# Patient Record
Sex: Female | Born: 1940 | Race: White | Hispanic: No | Marital: Married | Smoking: Current every day smoker
Health system: Southern US, Community
[De-identification: ages and names within clinical notes are randomized; demographics above are authoritative.]

## PROBLEM LIST (undated history)

## (undated) DIAGNOSIS — F0390 Unspecified dementia without behavioral disturbance: Secondary | ICD-10-CM

## (undated) HISTORY — DX: Unspecified dementia, unspecified severity, without behavioral disturbance, psychotic disturbance, mood disturbance, and anxiety: F03.90

---

## 2000-11-24 ENCOUNTER — Emergency Department (HOSPITAL_COMMUNITY): Admission: EM | Admit: 2000-11-24 | Discharge: 2000-11-25 | Payer: Self-pay | Admitting: Emergency Medicine

## 2010-11-02 ENCOUNTER — Encounter
Admission: RE | Admit: 2010-11-02 | Discharge: 2010-11-02 | Payer: Self-pay | Source: Home / Self Care | Attending: Family Medicine | Admitting: Family Medicine

## 2014-01-13 ENCOUNTER — Other Ambulatory Visit: Payer: Self-pay | Admitting: Family Medicine

## 2014-01-13 DIAGNOSIS — Z1231 Encounter for screening mammogram for malignant neoplasm of breast: Secondary | ICD-10-CM

## 2014-01-26 ENCOUNTER — Ambulatory Visit
Admission: RE | Admit: 2014-01-26 | Discharge: 2014-01-26 | Disposition: A | Discharge status: Home / Self Care | Payer: Medicare Other | Source: Ambulatory Visit | Attending: Family Medicine | Admitting: Family Medicine

## 2014-01-26 DIAGNOSIS — Z1231 Encounter for screening mammogram for malignant neoplasm of breast: Secondary | ICD-10-CM

## 2021-03-22 ENCOUNTER — Other Ambulatory Visit: Payer: Self-pay

## 2021-03-22 ENCOUNTER — Emergency Department (HOSPITAL_COMMUNITY): Payer: Medicare Other

## 2021-03-22 ENCOUNTER — Inpatient Hospital Stay (HOSPITAL_COMMUNITY)
Admission: EM | Admit: 2021-03-22 | Discharge: 2021-03-25 | DRG: 445 | Disposition: A | Discharge status: Home Health | Payer: Medicare Other | Attending: Internal Medicine | Admitting: Internal Medicine

## 2021-03-22 DIAGNOSIS — R7989 Other specified abnormal findings of blood chemistry: Secondary | ICD-10-CM | POA: Diagnosis not present

## 2021-03-22 DIAGNOSIS — E871 Hypo-osmolality and hyponatremia: Secondary | ICD-10-CM | POA: Diagnosis present

## 2021-03-22 DIAGNOSIS — F17210 Nicotine dependence, cigarettes, uncomplicated: Secondary | ICD-10-CM | POA: Diagnosis present

## 2021-03-22 DIAGNOSIS — Y92009 Unspecified place in unspecified non-institutional (private) residence as the place of occurrence of the external cause: Secondary | ICD-10-CM

## 2021-03-22 DIAGNOSIS — S22051A Stable burst fracture of T5-T6 vertebra, initial encounter for closed fracture: Secondary | ICD-10-CM | POA: Diagnosis present

## 2021-03-22 DIAGNOSIS — E860 Dehydration: Secondary | ICD-10-CM | POA: Diagnosis present

## 2021-03-22 DIAGNOSIS — W010XXA Fall on same level from slipping, tripping and stumbling without subsequent striking against object, initial encounter: Secondary | ICD-10-CM | POA: Diagnosis present

## 2021-03-22 DIAGNOSIS — W19XXXA Unspecified fall, initial encounter: Secondary | ICD-10-CM

## 2021-03-22 DIAGNOSIS — R112 Nausea with vomiting, unspecified: Secondary | ICD-10-CM | POA: Diagnosis present

## 2021-03-22 DIAGNOSIS — K831 Obstruction of bile duct: Secondary | ICD-10-CM | POA: Diagnosis present

## 2021-03-22 DIAGNOSIS — R935 Abnormal findings on diagnostic imaging of other abdominal regions, including retroperitoneum: Secondary | ICD-10-CM | POA: Diagnosis not present

## 2021-03-22 DIAGNOSIS — G9341 Metabolic encephalopathy: Secondary | ICD-10-CM | POA: Diagnosis not present

## 2021-03-22 DIAGNOSIS — Z419 Encounter for procedure for purposes other than remedying health state, unspecified: Secondary | ICD-10-CM

## 2021-03-22 DIAGNOSIS — K810 Acute cholecystitis: Secondary | ICD-10-CM | POA: Diagnosis not present

## 2021-03-22 DIAGNOSIS — E861 Hypovolemia: Secondary | ICD-10-CM | POA: Diagnosis present

## 2021-03-22 DIAGNOSIS — F0390 Unspecified dementia without behavioral disturbance: Secondary | ICD-10-CM | POA: Diagnosis present

## 2021-03-22 DIAGNOSIS — K828 Other specified diseases of gallbladder: Secondary | ICD-10-CM | POA: Diagnosis present

## 2021-03-22 DIAGNOSIS — T1490XA Injury, unspecified, initial encounter: Secondary | ICD-10-CM

## 2021-03-22 DIAGNOSIS — K269 Duodenal ulcer, unspecified as acute or chronic, without hemorrhage or perforation: Secondary | ICD-10-CM | POA: Diagnosis present

## 2021-03-22 DIAGNOSIS — R1010 Upper abdominal pain, unspecified: Secondary | ICD-10-CM

## 2021-03-22 DIAGNOSIS — Z20822 Contact with and (suspected) exposure to covid-19: Secondary | ICD-10-CM | POA: Diagnosis present

## 2021-03-22 DIAGNOSIS — E042 Nontoxic multinodular goiter: Secondary | ICD-10-CM | POA: Diagnosis present

## 2021-03-22 DIAGNOSIS — D6489 Other specified anemias: Secondary | ICD-10-CM | POA: Diagnosis present

## 2021-03-22 DIAGNOSIS — E876 Hypokalemia: Secondary | ICD-10-CM | POA: Diagnosis not present

## 2021-03-22 DIAGNOSIS — S2232XA Fracture of one rib, left side, initial encounter for closed fracture: Secondary | ICD-10-CM | POA: Diagnosis present

## 2021-03-22 DIAGNOSIS — E7220 Disorder of urea cycle metabolism, unspecified: Secondary | ICD-10-CM | POA: Diagnosis present

## 2021-03-22 DIAGNOSIS — R17 Unspecified jaundice: Secondary | ICD-10-CM

## 2021-03-22 HISTORY — DX: Obstruction of bile duct: K83.1

## 2021-03-22 LAB — COMPREHENSIVE METABOLIC PANEL
ALT: 458 U/L — ABNORMAL HIGH (ref 0–44)
AST: 578 U/L — ABNORMAL HIGH (ref 15–41)
Albumin: 3.2 g/dL — ABNORMAL LOW (ref 3.5–5.0)
Alkaline Phosphatase: 1068 U/L — ABNORMAL HIGH (ref 38–126)
Anion gap: 11 (ref 5–15)
BUN: 15 mg/dL (ref 8–23)
CO2: 25 mmol/L (ref 22–32)
Calcium: 9.1 mg/dL (ref 8.9–10.3)
Chloride: 96 mmol/L — ABNORMAL LOW (ref 98–111)
Creatinine, Ser: 0.88 mg/dL (ref 0.44–1.00)
GFR, Estimated: >60 mL/min (ref >60)
Glucose, Bld: 116 mg/dL — ABNORMAL HIGH (ref 70–99)
Potassium: 3.5 mmol/L (ref 3.5–5.1)
Sodium: 132 mmol/L — ABNORMAL LOW (ref 135–145)
Total Bilirubin: 16.4 mg/dL — ABNORMAL HIGH (ref 0.3–1.2)
Total Protein: 6.7 g/dL (ref 6.5–8.1)

## 2021-03-22 LAB — AMMONIA: Ammonia: 44 umol/L — ABNORMAL HIGH (ref 9–35)

## 2021-03-22 LAB — URINALYSIS, ROUTINE W REFLEX MICROSCOPIC
Glucose, UA: NEGATIVE mg/dL
Hgb urine dipstick: NEGATIVE
Ketones, ur: 5 mg/dL — AB
Nitrite: NEGATIVE
Protein, ur: NEGATIVE mg/dL
Specific Gravity, Urine: 1.013 (ref 1.005–1.030)
pH: 5 (ref 5.0–8.0)

## 2021-03-22 LAB — CBC WITH DIFFERENTIAL/PLATELET
Abs Immature Granulocytes: 0.11 10*3/uL — ABNORMAL HIGH (ref 0.00–0.07)
Basophils Absolute: 0.1 10*3/uL (ref 0.0–0.1)
Basophils Relative: 1 %
Eosinophils Absolute: 0.2 10*3/uL (ref 0.0–0.5)
Eosinophils Relative: 3 %
HCT: 34.4 % — ABNORMAL LOW (ref 36.0–46.0)
Hemoglobin: 12.5 g/dL (ref 12.0–15.0)
Immature Granulocytes: 1 %
Lymphocytes Relative: 20 %
Lymphs Abs: 1.5 10*3/uL (ref 0.7–4.0)
MCH: 32.7 pg (ref 26.0–34.0)
MCHC: 36.3 g/dL — ABNORMAL HIGH (ref 30.0–36.0)
MCV: 90.1 fL (ref 80.0–100.0)
Monocytes Absolute: 0.8 10*3/uL (ref 0.1–1.0)
Monocytes Relative: 10 %
Neutro Abs: 5 10*3/uL (ref 1.7–7.7)
Neutrophils Relative %: 65 %
Platelets: 567 10*3/uL — ABNORMAL HIGH (ref 150–400)
RBC: 3.82 MIL/uL — ABNORMAL LOW (ref 3.87–5.11)
RDW: 15.3 % (ref 11.5–15.5)
WBC: 7.7 10*3/uL (ref 4.0–10.5)
nRBC: 0 % (ref 0.0–0.2)

## 2021-03-22 LAB — TROPONIN I (HIGH SENSITIVITY): Troponin I (High Sensitivity): 15 ng/L (ref <18)

## 2021-03-22 LAB — ACETAMINOPHEN LEVEL: Acetaminophen (Tylenol), Serum: <10 ug/mL — ABNORMAL LOW (ref 10–30)

## 2021-03-22 LAB — LIPASE, BLOOD: Lipase: 27 U/L (ref 11–51)

## 2021-03-22 LAB — SALICYLATE LEVEL: Salicylate Lvl: <7 mg/dL — ABNORMAL LOW (ref 7.0–30.0)

## 2021-03-22 LAB — ETHANOL: Alcohol, Ethyl (B): <10 mg/dL (ref <10)

## 2021-03-22 NOTE — ED Provider Notes (Signed)
Emergency Medicine Provider Triage Evaluation Note  Alicia Spencer , a 80 y.o. female  was evaluated in triage.  Pt complains of ribs injury.  Review of Systems  Positive: L chest wall pain, fall Negative: Lightheadedness, sob, focal weakness or numbness, n/v/d  Physical Exam  There were no vitals taken for this visit. Gen:   Awake, no distress   Resp:  Normal effort  MSK:   Moves extremities without difficulty  Other:  Jaundice in appearance.  TTP to L chest wall, mild upper abd tenderness, no significant midline spine tenderness.  In NAD  Medical Decision Making  Medically screening exam initiated at 3:59 PM.  Appropriate orders placed.  Alicia Spencer was informed that the remainder of the evaluation will be completed by another provider, this initial triage assessment does not replace that evaluation, and the importance of remaining in the ED until their evaluation is complete.  Report falling and striking her L chest against hard object 2 days ago.  Pt is jaundice but is not aware of being jaundice.  Tobacco use but denies alcohol use.  No report hx of cancer.    Fayrene Helper, PA-C 03/22/21 1601    Milagros Loll, MD 03/23/21 [phone]

## 2021-03-22 NOTE — ED Provider Notes (Signed)
[facility] Provider Note   CSN: [id number] Arrival date & time: 03/22/21  1516     History Chief Complaint  Patient presents with  . Jaundice    Alicia Spencer is a 80 y.o. female.  80 y.o female with no PMH presents to the ED with a chief complaint of right side pain and epigastric pain s/p fall x 1 1/2 week ago.  Patient reports she stumbled and tripped causing her to fall forward specifically landing on the right side of her body.  She reports since this fall she has had pain along the thoracic spine, and epigastric region.  She reports she has been taking Tylenol to help with her pain, has only taken 1 pill daily.  She states "you should only take 1 pill ".  She also reports changes in her short term memory.  She states she is able to place an object on the table, then states "I do not know where the object went and I cannot find the words to describe it ". She feels "like I cannot find my words". She is a very poor tangential historian, will contact family in order to obtain collateral information. No chest pain, no shortness of breath, no nausea or vomiting.   The history is provided by the patient and a relative.       No past medical history on file.  There are no problems to display for this patient.     OB History   No obstetric history on file.     No family history on file.     Home Medications Prior to Admission medications   Not on File    Allergies    Sulfites and Iodine  Review of Systems   Review of Systems  Physical Exam Updated Vital Signs BP (!) 158/96 (BP Location: Left Arm)   Pulse 71   Temp 97.7 F (36.5 C) (Oral)   Resp 16   LMP  (LMP Unknown)   SpO2 99%   Physical Exam Vitals and nursing note reviewed.  Constitutional:      Appearance: She is not ill-appearing or toxic-appearing.  HENT:     Head: Normocephalic and atraumatic.     Mouth/Throat:     Mouth: Mucous membranes are moist.   Eyes:     General: Scleral icterus present.  Cardiovascular:     Rate and Rhythm: Normal rate.  Pulmonary:     Effort: Pulmonary effort is normal.  Abdominal:     General: Abdomen is flat. Bowel sounds are decreased.     Palpations: Abdomen is soft. There is no mass.     Tenderness: There is abdominal tenderness. There is no right CVA tenderness or left CVA tenderness. Negative signs include Murphy's sign.     Hernia: No hernia is present.  Musculoskeletal:     Cervical back: Normal range of motion and neck supple.  Skin:    General: Skin is warm and dry.  Neurological:     Mental Status: She is alert and oriented to person, place, and time.     ED Results / Procedures / Treatments   Labs (all labs ordered are listed, but only abnormal results are displayed) Labs Reviewed  CBC WITH DIFFERENTIAL/PLATELET - Abnormal; Notable for the following components:      Result Value   RBC 3.82 (*)    HCT 34.4 (*)    MCHC 36.3 (*)    Platelets 567 (*)  Abs Immature Granulocytes 0.11 (*)    All other components within normal limits  COMPREHENSIVE METABOLIC PANEL - Abnormal; Notable for the following components:   Sodium 132 (*)    Chloride 96 (*)    Glucose, Bld 116 (*)    Albumin 3.2 (*)    AST 578 (*)    ALT 458 (*)    Alkaline Phosphatase 1,068 (*)    Total Bilirubin 16.4 (*)    All other components within normal limits  URINALYSIS, ROUTINE W REFLEX MICROSCOPIC - Abnormal; Notable for the following components:   Color, Urine AMBER (*)    APPearance HAZY (*)    Bilirubin Urine MODERATE (*)    Ketones, ur 5 (*)    Leukocytes,Ua TRACE (*)    Bacteria, UA RARE (*)    All other components within normal limits  ACETAMINOPHEN LEVEL - Abnormal; Notable for the following components:   Acetaminophen (Tylenol), Serum <10 (*)    All other components within normal limits  SALICYLATE LEVEL - Abnormal; Notable for the following components:   Salicylate Lvl <6.3 (*)    All other  components within normal limits  AMMONIA - Abnormal; Notable for the following components:   Ammonia 44 (*)    All other components within normal limits  LIPASE, BLOOD  ETHANOL  HEPATITIS PANEL, ACUTE  TROPONIN I (HIGH SENSITIVITY)  TROPONIN I (HIGH SENSITIVITY)    EKG None  Radiology DG Ribs Unilateral W/Chest Left  Result Date: 03/22/2021 CLINICAL DATA:  80 year old female with fall. EXAM: LEFT RIBS AND CHEST - 3+ VIEW COMPARISON:  None. FINDINGS: No focal consolidation, pleural effusion, or pneumothorax. The cardiac silhouette is within limits. Atherosclerotic calcification of the aorta. Osteopenia with degenerative changes of the spine. Evaluation for rib fracture is very limited due to advanced osteopenia. No obvious or displaced rib fracture identified. IMPRESSION: Negative. Electronically Signed   By: Anner Crete M.D.   On: 03/22/2021 16:30   CT Head Wo Contrast  Result Date: 03/22/2021 CLINICAL DATA:  Fall EXAM: CT HEAD WITHOUT CONTRAST TECHNIQUE: Contiguous axial images were obtained from the base of the skull through the vertex without intravenous contrast. COMPARISON:  None. FINDINGS: Brain: There is atrophy and chronic small vessel disease changes. No acute intracranial abnormality. Specifically, no hemorrhage, hydrocephalus, mass lesion, acute infarction, or significant intracranial injury. Vascular: No hyperdense vessel or unexpected calcification. Skull: No acute calvarial abnormality. Sinuses/Orbits: Visualized paranasal sinuses and mastoids clear. Orbital soft tissues unremarkable. Other: None IMPRESSION: Atrophy, chronic microvascular disease. No acute intracranial abnormality. Electronically Signed   By: Rolm Baptise M.D.   On: 03/22/2021 22:50   CT Cervical Spine Wo Contrast  Result Date: 03/22/2021 CLINICAL DATA:  Fall EXAM: CT CERVICAL SPINE WITHOUT CONTRAST TECHNIQUE: Multidetector CT imaging of the cervical spine was performed without intravenous contrast.  Multiplanar CT image reconstructions were also generated. COMPARISON:  None. FINDINGS: Alignment: No subluxation. Skull base and vertebrae: No acute fracture. No primary bone lesion or focal pathologic process. Soft tissues and spinal canal: No prevertebral fluid or swelling. No visible canal hematoma. Disc levels: Degenerative disc disease most pronounced at C5-6 and C6-7. Bilateral degenerative facet disease, left greater than right. Upper chest: No acute findings Other: Nodule replaces much of the right thyroid lobe measuring up to 2 cm. Smaller nodule in the left thyroid lobe measuring 9 mm. IMPRESSION: Degenerative disc and facet disease.  No acute bony abnormality. Bilateral thyroid nodules measuring up 2 cm in the right thyroid lobe. Recommend non emergent  thyroid US (ref: J Am Coll Radiol. 2015 Feb;12(2): 143-50). Electronically Signed   By: Rolm Baptise M.D.   On: 03/22/2021 22:53   CT T-SPINE NO CHARGE  Result Date: 03/22/2021 CLINICAL DATA:  Fall EXAM: CT Thoracic and Lumbar spine without contrast TECHNIQUE: Multiplanar CT images of the thoracic and lumbar spine were reconstructed from contemporary CT of the Chest, Abdomen, and Pelvis CONTRAST:  No additional contrast COMPARISON:  None FINDINGS: CT THORACIC SPINE FINDINGS Alignment: Normal. Vertebrae: Burst type fracture of T5 with approximately 50% height loss. 2 mm retropulsion. No extension to the posterior elements. Paraspinal and other soft tissues: Calcific aortic atherosclerosis. Disc levels: No spinal canal stenosis. CT LUMBAR SPINE FINDINGS Segmentation: 5 lumbar type vertebrae. Alignment: Grade 1 anterolisthesis at L4-5 Vertebrae: No acute fracture or focal pathologic process. Paraspinal and other soft tissues: Calcific aortic atherosclerosis. Markedly distended gallbladder. Disc levels: Mild spinal canal stenosis at L4-5 secondary to anterolisthesis caused by facet arthrosis. No neural impingement. Moderate right L5-S1 neural foraminal  stenosis. IMPRESSION: 1. Burst type fracture of T5 with approximately 50% height loss and 2 mm retropulsion. No extension to the posterior elements. 2. No acute fracture of the lumbar spine. 3. Mild spinal canal stenosis at L4-5 secondary to anterolisthesis and facet arthrosis. 4. Moderate right L5-S1 neural foraminal stenosis. 5. Markedly distended gallbladder. Aortic Atherosclerosis (ICD10-I70.0). Electronically Signed   By: Ulyses Jarred M.D.   On: 03/22/2021 22:59   CT L-SPINE NO CHARGE  Result Date: 03/22/2021 CLINICAL DATA:  Fall EXAM: CT Thoracic and Lumbar spine without contrast TECHNIQUE: Multiplanar CT images of the thoracic and lumbar spine were reconstructed from contemporary CT of the Chest, Abdomen, and Pelvis CONTRAST:  No additional contrast COMPARISON:  None FINDINGS: CT THORACIC SPINE FINDINGS Alignment: Normal. Vertebrae: Burst type fracture of T5 with approximately 50% height loss. 2 mm retropulsion. No extension to the posterior elements. Paraspinal and other soft tissues: Calcific aortic atherosclerosis. Disc levels: No spinal canal stenosis. CT LUMBAR SPINE FINDINGS Segmentation: 5 lumbar type vertebrae. Alignment: Grade 1 anterolisthesis at L4-5 Vertebrae: No acute fracture or focal pathologic process. Paraspinal and other soft tissues: Calcific aortic atherosclerosis. Markedly distended gallbladder. Disc levels: Mild spinal canal stenosis at L4-5 secondary to anterolisthesis caused by facet arthrosis. No neural impingement. Moderate right L5-S1 neural foraminal stenosis. IMPRESSION: 1. Burst type fracture of T5 with approximately 50% height loss and 2 mm retropulsion. No extension to the posterior elements. 2. No acute fracture of the lumbar spine. 3. Mild spinal canal stenosis at L4-5 secondary to anterolisthesis and facet arthrosis. 4. Moderate right L5-S1 neural foraminal stenosis. 5. Markedly distended gallbladder. Aortic Atherosclerosis (ICD10-I70.0). Electronically Signed   By:  Ulyses Jarred M.D.   On: 03/22/2021 22:59   CT CHEST ABDOMEN PELVIS WO CONTRAST  Result Date: 03/22/2021 CLINICAL DATA:  [place] and hit left chest EXAM: CT CHEST, ABDOMEN AND PELVIS WITHOUT CONTRAST TECHNIQUE: Multidetector CT imaging of the chest, abdomen and pelvis was performed following the standard protocol without IV contrast. COMPARISON:  None. FINDINGS: CT CHEST FINDINGS Cardiovascular: Limited evaluation without intravenous contrast. Advanced aortic atherosclerosis. No aneurysm. Coronary vascular calcification. Normal cardiac size. No pericardial effusion Mediastinum/Nodes: Midline trachea. Suspicion of 1.8 cm right thyroid nodule. No suspicious nodes. Esophagus within normal limits Lungs/Pleura: No acute consolidation, pleural effusion, or pneumothorax. Musculoskeletal: Sternum is intact. Severe compression fracture of T5. Acute nondisplaced left eleventh posterior rib fracture. CT ABDOMEN PELVIS FINDINGS Hepatobiliary: No focal hepatic abnormality. Markedly distended gallbladder with mild surrounding edema. No  definite calcified stone. Intra and extrahepatic biliary dilatation, extrahepatic common bile duct dilated up to 15 mm. Pancreas: No inflammatory changes.  No ductal dilatation. Spleen: Normal in size without focal abnormality. Adrenals/Urinary Tract: Adrenal glands are unremarkable. Kidneys are normal, without renal calculi, focal lesion, or hydronephrosis. Bladder is unremarkable. Stomach/Bowel: Stomach is nonenlarged. No dilated small bowel. Fluid-filled bowel without obstructive pattern. No acute bowel wall thickening Vascular/Lymphatic: Advanced aortic atherosclerosis without aneurysm. No suspicious nodes. Reproductive: Large calcified left uterine fibroid.  No adnexal mass Other: Negative for free air or free fluid. Musculoskeletal: No evidence for pelvic fracture. Orthopedic hardware in the right femur. Grade 1 anterolisthesis L4 on L5. IMPRESSION: 1. No CT evidence for acute intrathoracic  abnormality. Severe compression fracture T5, see separately dictated CT thoracic and lumbar reports. Acute nondisplaced left eleventh posterior rib fracture. 2. Markedly distended gallbladder with surrounding soft tissue stranding, raising possibility of cholecystitis. Recommend correlation with ultrasound. There is moderate to marked intra and extrahepatic biliary dilatation, suspicious for ductal obstruction, this may be evaluated with MRCP. 3. Large calcified uterine fibroid 4. Suspected 1.8 cm right thyroid nodule. Recommend thyroid US (ref: J Am Coll Radiol. 2015 Feb;12(2): 143-50). This may be performed non emergently. Electronically Signed   By: Donavan Foil M.D.   On: 03/22/2021 23:32    Procedures Procedures   Medications Ordered in ED Medications - No data to display  ED Course  I have reviewed the triage vital signs and the nursing notes.  Pertinent labs & imaging results that were available during my care of the patient were reviewed by me and considered in my medical decision making (see chart for details).  Clinical Course as of 03/22/21 2347  Wed Mar 22, 2021  2137 AST(!): 578 [JS]  2137 ALT(!): 458 [JS]  2137 Alkaline Phosphatase(!): 1,068 [JS]  2137 Bilirubin Urine(!): MODERATE [JS]  2321 Ammonia(!): 44 [JS]  2324 Total Bilirubin(!): 16.4 [JS]  2324 Alkaline Phosphatase(!): 1,068 [JS]    Clinical Course User Index [JS] Janeece Fitting, PA-C   MDM Rules/Calculators/A&P  Patient presents to the ED status post fall a week and a half ago, reports most of the fall was observed by the left side of her body.  She is reporting thoracic pain, upper abdominal pain that is worse with palpation.  She feels like that part of her abdomen "looks swollen", has taken some over-the-counter Tylenol without much improvement in her symptoms.  Evaluated at 2 urgent cares previously without feeling like she had gotten most of her work-up done.  She was evaluated by her PCP today, who referred  him to the ED for further work-up.  I am unable to see these records on her chart.  She does report she has not any history of alcohol abuse, no history of Tylenol use, does report that she felt somewhat "confused", has had occultly finding her words, has really difficulty with her short-term memory.  During my evaluation she is overall non-ill, nontoxic appearance, sclera icterus is noticeable, along with jaundice to her skin.  There is mild bruising and hematoma noted to the epigastric region.  Bowel sounds are decreased, reports no bowel movement for the past week.  Lungs are diminished to auscultation without any obvious rhonchi or wheezing.  Moves all upper and lower extremities, ambulates with a cane at baseline and steady gait in the ED.  Interpretation the ED of her labs CBC without leukocytosis, hemoglobin is stable.  CMP with mild hyponatremia, creatinine level is within normal limits.  LFTs are remarkable for an AST of 578, ALT of 458, alk phos of 1068, total bili is 16.4, lipase level is within normal limits.  Does have no prior history to her abdomen.  Her UA is remarkable with trace of leukocytes, moderate bilirubin also present.  Will obtain further labs, feel that patient is appropriate for CT imaging at this time.  She is agreeable to this  10:24 PM Spoke to daughter Clarene Critchley in order to obtain collateral information. She last saw patient yesterday, reports she fell off some big steps, and also tripped over a dog crate and fell on the left side. She has swelling underneath her boobs. She was seen at two different emergency clinic and is unsure "I dont know what they said there". She was given "pain pill and muscle relaxers". She was sent by PCP to ED. Her husband noted she had some yellowing in the eyes and along her skin. No prior PMH. She has not been able to poop since, "its been like a week". She has been taking metamucil to help her with peptobismol, has had some improvement. They noted  her stool "was kind of dark".   CT Chest/abdomen:  1. No CT evidence for acute intrathoracic abnormality. Severe  compression fracture T5, see separately dictated CT thoracic and  lumbar reports. Acute nondisplaced left eleventh posterior rib  fracture.  2. Markedly distended gallbladder with surrounding soft tissue  stranding, raising possibility of cholecystitis. Recommend  correlation with ultrasound. There is moderate to marked intra and  extrahepatic biliary dilatation, suspicious for ductal obstruction,  this may be evaluated with MRCP.  3. Large calcified uterine fibroid  4. Suspected 1.8 cm right thyroid nodule. Recommend thyroid US (ref:  J Am Coll Radiol. 2015 Feb;12(2): 143-50). This may be performed non  emergently.     CT Tspine showed:  1. Burst type fracture of T5 with approximately 50% height loss and  2 mm retropulsion. No extension to the posterior elements.  2. No acute fracture of the lumbar spine.  3. Mild spinal canal stenosis at L4-5 secondary to anterolisthesis  and facet arthrosis.  4. Moderate right L5-S1 neural foraminal stenosis.  5. Markedly distended gallbladder.     CT Head/Cervical Spine showed: Atrophy, chronic microvascular disease.    No acute intracranial abnormality.     Call placed to general surgery along with hospitalist for further recommendations.   11:42 PM Spoke to Dr. Rosendo Gros from general surgery who reports patient will likely have intervention tomorrow morning.  Spoke to hospitalist service Dr. Nevada Crane who will admit patient for further management.   Portions of this note were generated with [employer]. Dictation errors may occur despite best attempts at proofreading.  Final Clinical Impression(s) / ED Diagnoses Final diagnoses:  Fall  Elevated LFTs  Upper abdominal pain  Increased ammonia level    Rx / DC Orders ED Discharge Orders    None       Janeece Fitting, PA-C 03/22/21 2347    Lorelle Gibbs, DO 03/27/21 [phone]

## 2021-03-22 NOTE — ED Triage Notes (Signed)
Pt sent by PCP for eval of jaundice, abdominal sweling, and blood in stool. Also reports back pain from fall two days ago.

## 2021-03-22 NOTE — H&P (Signed)
History and Physical  Alicia Spencer IZT:[id number] DOB: [birth year] DOA: 03/22/2021  Referring physician: Gordy Clement PCP: [name], MD  Outpatient Specialists: None. Patient coming from: Home.  Chief Complaint: Epigastric pain nausea vomiting, jaundice.  HPI: Alicia Spencer is a 80 y.o. female with medical history significant for tobacco use disorder, 40+ years, who presented to [facility] ED due to worsening epigastric pain and back pain x1 week.  Associated with nausea, vomiting and apparent jaundice x1 day.  History is mainly obtained from the patient who is a poor historian, EDP, and review of medical records.  Patient reports having a mechanical fall about 2 weeks ago after leaning over a table and losing her balance.  Since then she has been having mid back pain for which she had been taking over-the-counter pain medications.  She denies use of alcohol.  Reports progressively worsening epigastric pain of 1 week duration.  Yesterday she vomited, her husband noted that she was jaundiced.  Due to worsening abdominal pain she decided to come to the ED for further evaluation.  Work-up in the ED revealed acute transaminitis, jaundice with concern for acute cholecystitis and choledocholithiasis.  EDP consulted general surgery who recommended hospitalist admission.  TRH, hospitalist team, was asked to admit.  ED Course:  Afebrile.  BP 158/96, pulse 81, respiration rate 16, O2 saturation 98% on room air.  Lab studies remarkable for serum sodium 132, serum bicarb 25, creatinine 0.88, alkaline phosphatase 1068, lipase 27, AST 578, ALT 458, total bilirubin 16.4.  Ammonia 44.  Troponin 15.  WBC 7.7, hemoglobin 12.5, platelet count 567.  Salicylate level less than 7.0, acetaminophen level less than 10.  Review of Systems: Review of systems as noted in the HPI. All other systems reviewed and are negative.  Past medical history: Not on any prescribed medications.  Social History: Denies use of alcohol.   Admits to tobacco use greater than 40 years and recently 4 cigarettes/day.  Allergies  Allergen Reactions  . Sulfites Anaphylaxis  . Iodine Other (See Comments)    welts   Family history: Father with history of alcoholism. Brother with history of heart disease.  Prior to Admission medications   Not on File    Physical Exam: BP (!) 158/96 (BP Location: Left Arm)   Pulse 71   Temp 97.7 F (36.5 C) (Oral)   Resp 16   LMP  (LMP Unknown)   SpO2 99%   . General: 80 y.o. year-old female well developed well nourished in no acute distress.  Alert and oriented x3. . Cardiovascular: Regular rate and rhythm with no rubs or gallops.  No thyromegaly or JVD noted.  No lower extremity edema. 2/4 pulses in all 4 extremities. Marland Kitchen Respiratory: Clear to auscultation with no wheezes or rales. Good inspiratory effort. . Abdomen: Soft epigastric and right upper quadrant tenderness with palpation nondistended with normal bowel sounds x4 quadrants. . Muskuloskeletal: No cyanosis, clubbing or edema noted bilaterally . Neuro: CN II-XII intact, strength, sensation, reflexes . Skin: No ulcerative lesions noted or rashes . Psychiatry: Judgement and insight appear normal. Mood is appropriate for condition and setting          Labs on Admission:  Basic Metabolic Panel: Recent Labs  Lab 03/22/21 1602  NA 132*  K 3.5  CL 96*  CO2 25  GLUCOSE 116*  BUN 15  CREATININE 0.88  CALCIUM 9.1   Liver Function Tests: Recent Labs  Lab 03/22/21 1602  AST 578*  ALT 458*  ALKPHOS 1,068*  BILITOT 16.4*  PROT 6.7  ALBUMIN 3.2*   Recent Labs  Lab 03/22/21 1602  LIPASE 27   Recent Labs  Lab 03/22/21 2156  AMMONIA 44*   CBC: Recent Labs  Lab 03/22/21 1602  WBC 7.7  NEUTROABS 5.0  HGB 12.5  HCT 34.4*  MCV 90.1  PLT 567*   Cardiac Enzymes: No results for input(s): CKTOTAL, CKMB, CKMBINDEX, TROPONINI in the last 168 hours.  BNP (last 3 results) No results for input(s): BNP in the last  8760 hours.  ProBNP (last 3 results) No results for input(s): PROBNP in the last 8760 hours.  CBG: No results for input(s): GLUCAP in the last 168 hours.  Radiological Exams on Admission: DG Ribs Unilateral W/Chest Left  Result Date: 03/22/2021 CLINICAL DATA:  80 year old female with fall. EXAM: LEFT RIBS AND CHEST - 3+ VIEW COMPARISON:  None. FINDINGS: No focal consolidation, pleural effusion, or pneumothorax. The cardiac silhouette is within limits. Atherosclerotic calcification of the aorta. Osteopenia with degenerative changes of the spine. Evaluation for rib fracture is very limited due to advanced osteopenia. No obvious or displaced rib fracture identified. IMPRESSION: Negative. Electronically Signed   By: Elgie CollardArash  Radparvar M.D.   On: 03/22/2021 16:30   CT Head Wo Contrast  Result Date: 03/22/2021 CLINICAL DATA:  Fall EXAM: CT HEAD WITHOUT CONTRAST TECHNIQUE: Contiguous axial images were obtained from the base of the skull through the vertex without intravenous contrast. COMPARISON:  None. FINDINGS: Brain: There is atrophy and chronic small vessel disease changes. No acute intracranial abnormality. Specifically, no hemorrhage, hydrocephalus, mass lesion, acute infarction, or significant intracranial injury. Vascular: No hyperdense vessel or unexpected calcification. Skull: No acute calvarial abnormality. Sinuses/Orbits: Visualized paranasal sinuses and mastoids clear. Orbital soft tissues unremarkable. Other: None IMPRESSION: Atrophy, chronic microvascular disease. No acute intracranial abnormality. Electronically Signed   By: Charlett NoseKevin  Dover M.D.   On: 03/22/2021 22:50   CT Cervical Spine Wo Contrast  Result Date: 03/22/2021 CLINICAL DATA:  Fall EXAM: CT CERVICAL SPINE WITHOUT CONTRAST TECHNIQUE: Multidetector CT imaging of the cervical spine was performed without intravenous contrast. Multiplanar CT image reconstructions were also generated. COMPARISON:  None. FINDINGS: Alignment: No subluxation.  Skull base and vertebrae: No acute fracture. No primary bone lesion or focal pathologic process. Soft tissues and spinal canal: No prevertebral fluid or swelling. No visible canal hematoma. Disc levels: Degenerative disc disease most pronounced at C5-6 and C6-7. Bilateral degenerative facet disease, left greater than right. Upper chest: No acute findings Other: Nodule replaces much of the right thyroid lobe measuring up to 2 cm. Smaller nodule in the left thyroid lobe measuring 9 mm. IMPRESSION: Degenerative disc and facet disease.  No acute bony abnormality. Bilateral thyroid nodules measuring up 2 cm in the right thyroid lobe. Recommend non emergent thyroid US (ref: J Am Coll Radiol. 2015 Feb;12(2): 143-50). Electronically Signed   By: Charlett NoseKevin  Dover M.D.   On: 03/22/2021 22:53   CT T-SPINE NO CHARGE  Result Date: 03/22/2021 CLINICAL DATA:  Fall EXAM: CT Thoracic and Lumbar spine without contrast TECHNIQUE: Multiplanar CT images of the thoracic and lumbar spine were reconstructed from contemporary CT of the Chest, Abdomen, and Pelvis CONTRAST:  No additional contrast COMPARISON:  None FINDINGS: CT THORACIC SPINE FINDINGS Alignment: Normal. Vertebrae: Burst type fracture of T5 with approximately 50% height loss. 2 mm retropulsion. No extension to the posterior elements. Paraspinal and other soft tissues: Calcific aortic atherosclerosis. Disc levels: No spinal canal stenosis. CT LUMBAR SPINE FINDINGS Segmentation:  5 lumbar type vertebrae. Alignment: Grade 1 anterolisthesis at L4-5 Vertebrae: No acute fracture or focal pathologic process. Paraspinal and other soft tissues: Calcific aortic atherosclerosis. Markedly distended gallbladder. Disc levels: Mild spinal canal stenosis at L4-5 secondary to anterolisthesis caused by facet arthrosis. No neural impingement. Moderate right L5-S1 neural foraminal stenosis. IMPRESSION: 1. Burst type fracture of T5 with approximately 50% height loss and 2 mm retropulsion. No  extension to the posterior elements. 2. No acute fracture of the lumbar spine. 3. Mild spinal canal stenosis at L4-5 secondary to anterolisthesis and facet arthrosis. 4. Moderate right L5-S1 neural foraminal stenosis. 5. Markedly distended gallbladder. Aortic Atherosclerosis (ICD10-I70.0). Electronically Signed   By: Deatra Robinson M.D.   On: 03/22/2021 22:59   CT L-SPINE NO CHARGE  Result Date: 03/22/2021 CLINICAL DATA:  Fall EXAM: CT Thoracic and Lumbar spine without contrast TECHNIQUE: Multiplanar CT images of the thoracic and lumbar spine were reconstructed from contemporary CT of the Chest, Abdomen, and Pelvis CONTRAST:  No additional contrast COMPARISON:  None FINDINGS: CT THORACIC SPINE FINDINGS Alignment: Normal. Vertebrae: Burst type fracture of T5 with approximately 50% height loss. 2 mm retropulsion. No extension to the posterior elements. Paraspinal and other soft tissues: Calcific aortic atherosclerosis. Disc levels: No spinal canal stenosis. CT LUMBAR SPINE FINDINGS Segmentation: 5 lumbar type vertebrae. Alignment: Grade 1 anterolisthesis at L4-5 Vertebrae: No acute fracture or focal pathologic process. Paraspinal and other soft tissues: Calcific aortic atherosclerosis. Markedly distended gallbladder. Disc levels: Mild spinal canal stenosis at L4-5 secondary to anterolisthesis caused by facet arthrosis. No neural impingement. Moderate right L5-S1 neural foraminal stenosis. IMPRESSION: 1. Burst type fracture of T5 with approximately 50% height loss and 2 mm retropulsion. No extension to the posterior elements. 2. No acute fracture of the lumbar spine. 3. Mild spinal canal stenosis at L4-5 secondary to anterolisthesis and facet arthrosis. 4. Moderate right L5-S1 neural foraminal stenosis. 5. Markedly distended gallbladder. Aortic Atherosclerosis (ICD10-I70.0). Electronically Signed   By: Deatra Robinson M.D.   On: 03/22/2021 22:59   CT CHEST ABDOMEN PELVIS WO CONTRAST  Result Date: 03/22/2021 CLINICAL  DATA:  Larey Seat and hit left chest EXAM: CT CHEST, ABDOMEN AND PELVIS WITHOUT CONTRAST TECHNIQUE: Multidetector CT imaging of the chest, abdomen and pelvis was performed following the standard protocol without IV contrast. COMPARISON:  None. FINDINGS: CT CHEST FINDINGS Cardiovascular: Limited evaluation without intravenous contrast. Advanced aortic atherosclerosis. No aneurysm. Coronary vascular calcification. Normal cardiac size. No pericardial effusion Mediastinum/Nodes: Midline trachea. Suspicion of 1.8 cm right thyroid nodule. No suspicious nodes. Esophagus within normal limits Lungs/Pleura: No acute consolidation, pleural effusion, or pneumothorax. Musculoskeletal: Sternum is intact. Severe compression fracture of T5. Acute nondisplaced left eleventh posterior rib fracture. CT ABDOMEN PELVIS FINDINGS Hepatobiliary: No focal hepatic abnormality. Markedly distended gallbladder with mild surrounding edema. No definite calcified stone. Intra and extrahepatic biliary dilatation, extrahepatic common bile duct dilated up to 15 mm. Pancreas: No inflammatory changes.  No ductal dilatation. Spleen: Normal in size without focal abnormality. Adrenals/Urinary Tract: Adrenal glands are unremarkable. Kidneys are normal, without renal calculi, focal lesion, or hydronephrosis. Bladder is unremarkable. Stomach/Bowel: Stomach is nonenlarged. No dilated small bowel. Fluid-filled bowel without obstructive pattern. No acute bowel wall thickening Vascular/Lymphatic: Advanced aortic atherosclerosis without aneurysm. No suspicious nodes. Reproductive: Large calcified left uterine fibroid.  No adnexal mass Other: Negative for free air or free fluid. Musculoskeletal: No evidence for pelvic fracture. Orthopedic hardware in the right femur. Grade 1 anterolisthesis L4 on L5. IMPRESSION: 1. No CT evidence for acute  intrathoracic abnormality. Severe compression fracture T5, see separately dictated CT thoracic and lumbar reports. Acute  nondisplaced left eleventh posterior rib fracture. 2. Markedly distended gallbladder with surrounding soft tissue stranding, raising possibility of cholecystitis. Recommend correlation with ultrasound. There is moderate to marked intra and extrahepatic biliary dilatation, suspicious for ductal obstruction, this may be evaluated with MRCP. 3. Large calcified uterine fibroid 4. Suspected 1.8 cm right thyroid nodule. Recommend thyroid US (ref: J Am Coll Radiol. 2015 Feb;12(2): 143-50). This may be performed non emergently. Electronically Signed   By: Jasmine Pang M.D.   On: 03/22/2021 23:32    EKG: I independently viewed the EKG done and my findings are as followed: None available at the time of this visit.  Assessment/Plan Present on Admission: . Acute cholecystitis  Active Problems:   Acute cholecystitis  Abdominal pain with concern for acute cholecystitis and choledocholithiasis CT abdomen and pelvis without contrast showing markedly distended gallbladder with surrounding soft tissue stranding raising possibility of cholecystitis.   Common bile duct dilated up to 15 mm. Obtain HIDA scan, blood cultures. IV fluid hydration IV antibiotics empirically, Zosyn. N.p.o. until seen by general surgery  Acute transaminitis/hyperbilirubinemia with concern for choledocholithiasis Trend LFTs Avoid hepatotoxic agents. T bilirubin 16 Common bile duct dilatation up to 15 mm EDP consulted general surgery GI consult for possible ERCP.  Hyperammonemia No focal hepatic abnormality Ammonia 44 Lactulose  Hypovolemic hyponatremia IV fluid hydration for sodium of 132. Repeat chemistry panel in the morning.  Tobacco use disorder Tobacco cessation counseling at bedside Nicotine patch as needed  Right thyroid nodule 1.8 cm, incidentally found Obtain TSH Will need to follow-up outpatient.  Severe compression fracture of T5/acute nondisplaced left 11th posterior rib fracture post mechanical  fall. Pain control.  Non-intractable nausea and vomiting IV antiemetics as needed    DVT prophylaxis: Subcu Lovenox daily.  Code Status: Full code as stated by the patient herself.  Family Communication: None at bedside.  Patient lives with her husband.  She has a daughter.  Disposition Plan: Admit to telemetry medical.  Consults called: General surgery, Dr. Derrell Lolling consulted by EDP, GI, Dr. Barron Alvine.  Admission status: Inpatient status.  Patient will require at least 2 midnightS for further evaluation and treatment of present condition.   Status is: Inpatient    Dispo: The patient is from: Home.              Anticipated d/c is to: Home on 03/25/2021 or when general surgery and GI sign off.              Patient currently not stable for discharge due to ongoing management of suspected acute cholecystitis.   Difficult to place patient, not applicable.       Darlin Drop MD [facility] Pager [phone]  If 7PM-7AM, please contact night-coverage [email or web address] Password [facility]  03/22/2021, 11:50 PM

## 2021-03-22 NOTE — ED Notes (Signed)
Daughter [name] [phone] would like to speak to her mother when possible

## 2021-03-23 ENCOUNTER — Inpatient Hospital Stay (HOSPITAL_COMMUNITY): Payer: Medicare Other

## 2021-03-23 DIAGNOSIS — K831 Obstruction of bile duct: Secondary | ICD-10-CM | POA: Diagnosis not present

## 2021-03-23 DIAGNOSIS — R935 Abnormal findings on diagnostic imaging of other abdominal regions, including retroperitoneum: Secondary | ICD-10-CM

## 2021-03-23 LAB — PROTIME-INR
INR: 1 (ref 0.8–1.2)
Prothrombin Time: 13.6 seconds (ref 11.4–15.2)

## 2021-03-23 LAB — COMPREHENSIVE METABOLIC PANEL
ALT: 396 U/L — ABNORMAL HIGH (ref 0–44)
AST: 481 U/L — ABNORMAL HIGH (ref 15–41)
Albumin: 3.1 g/dL — ABNORMAL LOW (ref 3.5–5.0)
Alkaline Phosphatase: 1079 U/L — ABNORMAL HIGH (ref 38–126)
Anion gap: 13 (ref 5–15)
BUN: 13 mg/dL (ref 8–23)
CO2: 24 mmol/L (ref 22–32)
Calcium: 9 mg/dL (ref 8.9–10.3)
Chloride: 96 mmol/L — ABNORMAL LOW (ref 98–111)
Creatinine, Ser: 0.66 mg/dL (ref 0.44–1.00)
GFR, Estimated: >60 mL/min (ref >60)
Glucose, Bld: 105 mg/dL — ABNORMAL HIGH (ref 70–99)
Potassium: 3.6 mmol/L (ref 3.5–5.1)
Sodium: 133 mmol/L — ABNORMAL LOW (ref 135–145)
Total Bilirubin: 15.8 mg/dL — ABNORMAL HIGH (ref 0.3–1.2)
Total Protein: 6.8 g/dL (ref 6.5–8.1)

## 2021-03-23 LAB — CBC
HCT: 32.3 % — ABNORMAL LOW (ref 36.0–46.0)
Hemoglobin: 11.5 g/dL — ABNORMAL LOW (ref 12.0–15.0)
MCH: 32 pg (ref 26.0–34.0)
MCHC: 35.6 g/dL (ref 30.0–36.0)
MCV: 90 fL (ref 80.0–100.0)
Platelets: 557 10*3/uL — ABNORMAL HIGH (ref 150–400)
RBC: 3.59 MIL/uL — ABNORMAL LOW (ref 3.87–5.11)
RDW: 15.4 % (ref 11.5–15.5)
WBC: 7.8 10*3/uL (ref 4.0–10.5)
nRBC: 0 % (ref 0.0–0.2)

## 2021-03-23 LAB — TROPONIN I (HIGH SENSITIVITY): Troponin I (High Sensitivity): 17 ng/L (ref <18)

## 2021-03-23 LAB — HEPATITIS PANEL, ACUTE
HCV Ab: NONREACTIVE
Hep A IgM: NONREACTIVE
Hep B C IgM: NONREACTIVE
Hepatitis B Surface Ag: NONREACTIVE

## 2021-03-23 LAB — PHOSPHORUS: Phosphorus: 4.6 mg/dL (ref 2.5–4.6)

## 2021-03-23 LAB — MAGNESIUM: Magnesium: 2.1 mg/dL (ref 1.7–2.4)

## 2021-03-23 LAB — AMMONIA: Ammonia: 29 umol/L (ref 9–35)

## 2021-03-23 LAB — TSH: TSH: 1.178 u[IU]/mL (ref 0.350–4.500)

## 2021-03-23 LAB — SARS CORONAVIRUS 2 (TAT 6-24 HRS): SARS Coronavirus 2: NEGATIVE

## 2021-03-23 MED ORDER — HALOPERIDOL LACTATE 5 MG/ML IJ SOLN: 1.0000 mg | Freq: Two times a day (BID) | INTRAMUSCULAR | Status: DC | PRN

## 2021-03-23 MED ORDER — MELATONIN 3 MG PO TABS
3.0000 mg | ORAL_TABLET | Freq: Every evening | ORAL | Status: DC | PRN
  Administered 2021-03-23 – 2021-03-24 (×2): 3 mg via ORAL
  Filled 2021-03-23 (×2): qty 1

## 2021-03-23 MED ORDER — SODIUM CHLORIDE 0.9 % IV SOLN: INTRAVENOUS | Status: DC

## 2021-03-23 MED ORDER — PIPERACILLIN-TAZOBACTAM 3.375 G IVPB
3.3750 g | Freq: Three times a day (TID) | INTRAVENOUS | Status: DC
  Administered 2021-03-23 (×2): 3.375 g via INTRAVENOUS
  Filled 2021-03-23 (×2): qty 50

## 2021-03-23 MED ORDER — ONDANSETRON HCL 4 MG/2ML IJ SOLN: 4.0000 mg | Freq: Four times a day (QID) | INTRAMUSCULAR | Status: DC | PRN

## 2021-03-23 MED ORDER — HALOPERIDOL LACTATE 5 MG/ML IJ SOLN
INTRAMUSCULAR | Status: AC
  Administered 2021-03-23: 1 mg via INTRAVENOUS
  Filled 2021-03-23: qty 1

## 2021-03-23 MED ORDER — GADOBUTROL 1 MMOL/ML IV SOLN
5.0000 mL | Freq: Once | INTRAVENOUS | Status: AC | PRN
  Administered 2021-03-23: 5 mL via INTRAVENOUS

## 2021-03-23 MED ORDER — KCL-LACTATED RINGERS-D5W 20 MEQ/L IV SOLN
INTRAVENOUS | Status: DC
  Filled 2021-03-23: qty 1000

## 2021-03-23 MED ORDER — NICOTINE 7 MG/24HR TD PT24
7.0000 mg | MEDICATED_PATCH | Freq: Every day | TRANSDERMAL | Status: DC
  Administered 2021-03-23 – 2021-03-25 (×3): 7 mg via TRANSDERMAL
  Filled 2021-03-23 (×3): qty 1

## 2021-03-23 MED ORDER — LACTULOSE 10 GM/15ML PO SOLN
10.0000 g | Freq: Once | ORAL | Status: AC
  Administered 2021-03-23: 10 g via ORAL
  Filled 2021-03-23: qty 15

## 2021-03-23 MED ORDER — ENOXAPARIN SODIUM 30 MG/0.3ML IJ SOSY: 30.0000 mg | PREFILLED_SYRINGE | INTRAMUSCULAR | Status: DC

## 2021-03-23 MED ORDER — IBUPROFEN 200 MG PO TABS
200.0000 mg | ORAL_TABLET | Freq: Four times a day (QID) | ORAL | Status: DC | PRN
Start: 2021-03-23 — End: 2021-03-25

## 2021-03-23 MED ORDER — ENOXAPARIN SODIUM 40 MG/0.4ML IJ SOSY: 40.0000 mg | PREFILLED_SYRINGE | INTRAMUSCULAR | Status: DC

## 2021-03-23 MED ORDER — ENOXAPARIN SODIUM 30 MG/0.3ML IJ SOSY: 30.0000 mg | PREFILLED_SYRINGE | Freq: Every day | INTRAMUSCULAR | Status: DC

## 2021-03-23 NOTE — ED Notes (Signed)
Trying to get patient up to her room. She is fighting the staff not wanting to up stairs. Haldol given awaiting for it to start working.

## 2021-03-23 NOTE — ED Notes (Signed)
Sent to MRI at this time  

## 2021-03-23 NOTE — ED Notes (Signed)
Patient continues to try and get out of bed. Staffing states that they do not have any sitters available at this time.

## 2021-03-23 NOTE — ED Notes (Addendum)
Pt has gotten up several times throughout the night. This RN and Natasha Mead, NT have redirected pt back to bed multiple times and offered education on why it is important to stay in bed, such as fall risk, with many monitoring cords on pt.

## 2021-03-23 NOTE — Consult Note (Addendum)
[facility] Consult: 8:30 AM 03/23/2021  LOS: 1 day    Referring Provider: Dr Algis Liming  Primary Care Physician:  Christain Sacramento, MD Primary Gastroenterologist:  Althia Forts     Reason for Consultation: Jaundice.   HPI: Alicia Spencer is a 80 y.o. female.  PMH unremarkable.  Appears to have dementia. No previous colonoscopy or EGD. Normally pretty active, mows lawn with a riding mower, able to do outside gardening activities and walk her dog.  Seen at PCP office yesterday.  Primarily she was having pain in her left chest wall after falling a couple of days ago.  However she reports decreased appetite for a few weeks but less than a month.  Feels like she lost weight though she also feels like her abdomen is bloated without increase in girth.  No lower extremity edema.  Couple of days of dark urine and black, formed stools for a couple of days to episode so far.  No nausea or vomiting.  Naprosyn listed as a medication but he she is not taking this regularly. Na 133.  T bili 15.8.  Alk phos 1079.  AST/ALT 41/396. UN/creatinine, potassium normal. Hgb 11.5.  MCV 90.  WBCs normal.  Platelets 557. EtOH , APAP, salicylate levels not elevated Hep A IgM, hep B surface Ag, hep B core IgM, HCV antibody all nonreactive. COVID-19 negative. CT chest, ab, pelvis w/o contrast:   Gallbladder distended with pericolicsystic stranding, ?  Cholecystitis.  Marked intra and extrahepatic biliary ductal dilatation suspicious for obstruction.  Large uterine fibroid.  Right thyroid nodule.  Severe T5 compression fracture. CT head with atrophy, chronic microvascular disease.  Social history No alcohol currently or significant use in the past, occasional beer at night.  Previously smoked more but now up to 4 cigarettes daily.  Family  history No peptic ulcer disease, GI bleeds, anemia, liver disease, colorectal cancer.  He is 1 of 3 siblings.  Her older brother died at 96 from some sort of kidney problem.  No past medical history on file.    Prior to Admission medications   Medication Sig Start Date End Date Taking? Authorizing Provider  methocarbamol (ROBAXIN) 500 MG tablet Take 1,000 mg by mouth 4 (four) times daily. 03/17/21  Yes [provider]  naproxen (NAPROSYN) 500 MG tablet Take 500 mg by mouth 2 (two) times daily. 03/17/21  Yes [provider]    Scheduled Meds: . enoxaparin (LOVENOX) injection  30 mg Subcutaneous Daily  . nicotine  7 mg Transdermal Daily   Infusions: . dextrose 5% lactated ringers with KCl 20 mEq/L 50 mL/hr at 03/23/21 0150  . piperacillin-tazobactam (ZOSYN)  IV Stopped (03/23/21 2951)   PRN Meds: ibuprofen, melatonin, ondansetron (ZOFRAN) IV   Allergies as of 03/22/2021 - Review Complete 03/22/2021  Allergen Reaction Noted  . Sulfites Anaphylaxis 07/25/2016  . Iodine Other (See Comments) 07/25/2016    No family history on file.  Social History   Socioeconomic History  . Marital status: Married    Spouse name: Not on file  . Number of  children: Not on file  . Years of education: Not on file  . Highest education level: Not on file  Occupational History  . Not on file  Tobacco Use  . Smoking status: Not on file  . Smokeless tobacco: Not on file  Substance and Sexual Activity  . Alcohol use: Not on file  . Drug use: Not on file  . Sexual activity: Not on file  Other Topics Concern  . Not on file  Social History Narrative  . Not on file   Social Determinants of Health   Financial Resource Strain: Not on file  Food Insecurity: Not on file  Transportation Needs: Not on file  Physical Activity: Not on file  Stress: Not on file  Social Connections: Not on file  Intimate Partner Violence: Not on file    REVIEW OF SYSTEMS: Constitutional: No  profound fatigue or weakness. ENT:  No nose bleeds Pulm: Shortness of breath.  No cough. CV:  No palpitations, no LE edema.  No angina GU:  No hematuria, no frequency GI: See HPI Heme: No unusual bleeding or bruising Transfusions: None. Neuro:  No headaches, no peripheral tingling or numbness.  No seizures, no syncope. Derm:  No itching, no rash or sores.  Endocrine:  No sweats or chills.  No polyuria or dysuria Immunization: Not queried.   PHYSICAL EXAM: Vital signs in last 24 hours: Vitals:   03/23/21 0630 03/23/21 0719  BP: 129/70 (!) 144/96  Pulse: 81 86  Resp: 18 18  Temp:  97.7 F (36.5 C)  SpO2: 94% 99%   Wt Readings from Last 3 Encounters:  No data found for Wt    General: Jaundiced, pleasant, alert, comfortable.  Does not look acutely ill. Head: No facial asymmetry or swelling.  No signs of head trauma. Eyes: Icteric sclera. Ears: Not hard of hearing Nose: No congestion or discharge Mouth: Tongue midline.  Mucosa moist, pink, clear. Neck: No JVD, no masses, no thyromegaly Lungs: Clear bilaterally with good breath sounds.  Vocal quality is a bit raspy. Heart: RRR.  No MRG.  S1, S2 present Abdomen: Soft without tenderness.  No HSM, masses, bruits, hernias.   Rectal: Deferred Musc/Skeltl: No joint redness, swelling or gross deformity. Extremities: No CCE. Neurologic: Alert.  Oriented to the town she lives in but not to where she is currently.  Thought she was at the doctor's office.  Oriented to 2022 and to herself.  Able to answer questions appropriately.  Moves all 4 limbs.  No tremor, no asterixis.  No gross weakness though strength not formally tested. Skin: Jaundice.  No bruising seen, circularly in the left chest/thorax. Nodes: No cervical adenopathy Psych: Calm, pleasant, in good spirits.  Intake/Output from previous day: No intake/output data recorded. Intake/Output this shift: No intake/output data recorded.  LAB RESULTS: Recent Labs     03/22/21 1602 03/23/21 0300  WBC 7.7 7.8  HGB 12.5 11.5*  HCT 34.4* 32.3*  PLT 567* 557*   BMET Lab Results  Component Value Date   NA 133 (L) 03/23/2021   NA 132 (L) 03/22/2021   K 3.6 03/23/2021   K 3.5 03/22/2021   CL 96 (L) 03/23/2021   CL 96 (L) 03/22/2021   CO2 24 03/23/2021   CO2 25 03/22/2021   GLUCOSE 105 (H) 03/23/2021   GLUCOSE 116 (H) 03/22/2021   BUN 13 03/23/2021   BUN 15 03/22/2021   CREATININE 0.66 03/23/2021   CREATININE 0.88 03/22/2021   CALCIUM 9.0 03/23/2021  CALCIUM 9.1 03/22/2021   LFT Recent Labs    03/22/21 1602 03/23/21 0300  PROT 6.7 6.8  ALBUMIN 3.2* 3.1*  AST 578* 481*  ALT 458* 396*  ALKPHOS 1,068* 1,079*  BILITOT 16.4* 15.8*   PT/INR No results found for: INR, PROTIME Hepatitis Panel Recent Labs    03/22/21 2351  HEPBSAG NON REACTIVE  HCVAB NON REACTIVE  HEPAIGM NON REACTIVE  HEPBIGM NON REACTIVE   C-Diff No components found for: CDIFF Lipase     Component Value Date/Time   LIPASE 27 03/22/2021 1602    Drugs of Abuse  No results found for: LABOPIA, COCAINSCRNUR, LABBENZ, AMPHETMU, THCU, LABBARB   RADIOLOGY STUDIES: DG Ribs Unilateral W/Chest Left  Result Date: 03/22/2021 CLINICAL DATA:  80 year old female with fall. EXAM: LEFT RIBS AND CHEST - 3+ VIEW COMPARISON:  None. FINDINGS: No focal consolidation, pleural effusion, or pneumothorax. The cardiac silhouette is within limits. Atherosclerotic calcification of the aorta. Osteopenia with degenerative changes of the spine. Evaluation for rib fracture is very limited due to advanced osteopenia. No obvious or displaced rib fracture identified. IMPRESSION: Negative. Electronically Signed   By: Anner Crete M.D.   On: 03/22/2021 16:30   CT Head Wo Contrast  Result Date: 03/22/2021 CLINICAL DATA:  Fall EXAM: CT HEAD WITHOUT CONTRAST TECHNIQUE: Contiguous axial images were obtained from the base of the skull through the vertex without intravenous contrast. COMPARISON:   None. FINDINGS: Brain: There is atrophy and chronic small vessel disease changes. No acute intracranial abnormality. Specifically, no hemorrhage, hydrocephalus, mass lesion, acute infarction, or significant intracranial injury. Vascular: No hyperdense vessel or unexpected calcification. Skull: No acute calvarial abnormality. Sinuses/Orbits: Visualized paranasal sinuses and mastoids clear. Orbital soft tissues unremarkable. Other: None IMPRESSION: Atrophy, chronic microvascular disease. No acute intracranial abnormality. Electronically Signed   By: Rolm Baptise M.D.   On: 03/22/2021 22:50   CT Cervical Spine Wo Contrast  Result Date: 03/22/2021 CLINICAL DATA:  Fall EXAM: CT CERVICAL SPINE WITHOUT CONTRAST TECHNIQUE: Multidetector CT imaging of the cervical spine was performed without intravenous contrast. Multiplanar CT image reconstructions were also generated. COMPARISON:  None. FINDINGS: Alignment: No subluxation. Skull base and vertebrae: No acute fracture. No primary bone lesion or focal pathologic process. Soft tissues and spinal canal: No prevertebral fluid or swelling. No visible canal hematoma. Disc levels: Degenerative disc disease most pronounced at C5-6 and C6-7. Bilateral degenerative facet disease, left greater than right. Upper chest: No acute findings Other: Nodule replaces much of the right thyroid lobe measuring up to 2 cm. Smaller nodule in the left thyroid lobe measuring 9 mm. IMPRESSION: Degenerative disc and facet disease.  No acute bony abnormality. Bilateral thyroid nodules measuring up 2 cm in the right thyroid lobe. Recommend non emergent thyroid US (ref: J Am Coll Radiol. 2015 Feb;12(2): 143-50). Electronically Signed   By: Rolm Baptise M.D.   On: 03/22/2021 22:53   CT T-SPINE NO CHARGE  Result Date: 03/22/2021 CLINICAL DATA:  Fall EXAM: CT Thoracic and Lumbar spine without contrast TECHNIQUE: Multiplanar CT images of the thoracic and lumbar spine were reconstructed from contemporary  CT of the Chest, Abdomen, and Pelvis CONTRAST:  No additional contrast COMPARISON:  None FINDINGS: CT THORACIC SPINE FINDINGS Alignment: Normal. Vertebrae: Burst type fracture of T5 with approximately 50% height loss. 2 mm retropulsion. No extension to the posterior elements. Paraspinal and other soft tissues: Calcific aortic atherosclerosis. Disc levels: No spinal canal stenosis. CT LUMBAR SPINE FINDINGS Segmentation: 5 lumbar type vertebrae. Alignment: Grade 1 anterolisthesis  at L4-5 Vertebrae: No acute fracture or focal pathologic process. Paraspinal and other soft tissues: Calcific aortic atherosclerosis. Markedly distended gallbladder. Disc levels: Mild spinal canal stenosis at L4-5 secondary to anterolisthesis caused by facet arthrosis. No neural impingement. Moderate right L5-S1 neural foraminal stenosis. IMPRESSION: 1. Burst type fracture of T5 with approximately 50% height loss and 2 mm retropulsion. No extension to the posterior elements. 2. No acute fracture of the lumbar spine. 3. Mild spinal canal stenosis at L4-5 secondary to anterolisthesis and facet arthrosis. 4. Moderate right L5-S1 neural foraminal stenosis. 5. Markedly distended gallbladder. Aortic Atherosclerosis (ICD10-I70.0). Electronically Signed   By: Ulyses Jarred M.D.   On: 03/22/2021 22:59   CT L-SPINE NO CHARGE  Result Date: 03/22/2021 CLINICAL DATA:  Fall EXAM: CT Thoracic and Lumbar spine without contrast TECHNIQUE: Multiplanar CT images of the thoracic and lumbar spine were reconstructed from contemporary CT of the Chest, Abdomen, and Pelvis CONTRAST:  No additional contrast COMPARISON:  None FINDINGS: CT THORACIC SPINE FINDINGS Alignment: Normal. Vertebrae: Burst type fracture of T5 with approximately 50% height loss. 2 mm retropulsion. No extension to the posterior elements. Paraspinal and other soft tissues: Calcific aortic atherosclerosis. Disc levels: No spinal canal stenosis. CT LUMBAR SPINE FINDINGS Segmentation: 5 lumbar  type vertebrae. Alignment: Grade 1 anterolisthesis at L4-5 Vertebrae: No acute fracture or focal pathologic process. Paraspinal and other soft tissues: Calcific aortic atherosclerosis. Markedly distended gallbladder. Disc levels: Mild spinal canal stenosis at L4-5 secondary to anterolisthesis caused by facet arthrosis. No neural impingement. Moderate right L5-S1 neural foraminal stenosis. IMPRESSION: 1. Burst type fracture of T5 with approximately 50% height loss and 2 mm retropulsion. No extension to the posterior elements. 2. No acute fracture of the lumbar spine. 3. Mild spinal canal stenosis at L4-5 secondary to anterolisthesis and facet arthrosis. 4. Moderate right L5-S1 neural foraminal stenosis. 5. Markedly distended gallbladder. Aortic Atherosclerosis (ICD10-I70.0). Electronically Signed   By: Ulyses Jarred M.D.   On: 03/22/2021 22:59   CT CHEST ABDOMEN PELVIS WO CONTRAST  Result Date: 03/22/2021 CLINICAL DATA:  [place] and hit left chest EXAM: CT CHEST, ABDOMEN AND PELVIS WITHOUT CONTRAST TECHNIQUE: Multidetector CT imaging of the chest, abdomen and pelvis was performed following the standard protocol without IV contrast. COMPARISON:  None. FINDINGS: CT CHEST FINDINGS Cardiovascular: Limited evaluation without intravenous contrast. Advanced aortic atherosclerosis. No aneurysm. Coronary vascular calcification. Normal cardiac size. No pericardial effusion Mediastinum/Nodes: Midline trachea. Suspicion of 1.8 cm right thyroid nodule. No suspicious nodes. Esophagus within normal limits Lungs/Pleura: No acute consolidation, pleural effusion, or pneumothorax. Musculoskeletal: Sternum is intact. Severe compression fracture of T5. Acute nondisplaced left eleventh posterior rib fracture. CT ABDOMEN PELVIS FINDINGS Hepatobiliary: No focal hepatic abnormality. Markedly distended gallbladder with mild surrounding edema. No definite calcified stone. Intra and extrahepatic biliary dilatation, extrahepatic common bile duct  dilated up to 15 mm. Pancreas: No inflammatory changes.  No ductal dilatation. Spleen: Normal in size without focal abnormality. Adrenals/Urinary Tract: Adrenal glands are unremarkable. Kidneys are normal, without renal calculi, focal lesion, or hydronephrosis. Bladder is unremarkable. Stomach/Bowel: Stomach is nonenlarged. No dilated small bowel. Fluid-filled bowel without obstructive pattern. No acute bowel wall thickening Vascular/Lymphatic: Advanced aortic atherosclerosis without aneurysm. No suspicious nodes. Reproductive: Large calcified left uterine fibroid.  No adnexal mass Other: Negative for free air or free fluid. Musculoskeletal: No evidence for pelvic fracture. Orthopedic hardware in the right femur. Grade 1 anterolisthesis L4 on L5. IMPRESSION: 1. No CT evidence for acute intrathoracic abnormality. Severe compression fracture T5, see separately  dictated CT thoracic and lumbar reports. Acute nondisplaced left eleventh posterior rib fracture. 2. Markedly distended gallbladder with surrounding soft tissue stranding, raising possibility of cholecystitis. Recommend correlation with ultrasound. There is moderate to marked intra and extrahepatic biliary dilatation, suspicious for ductal obstruction, this may be evaluated with MRCP. 3. Large calcified uterine fibroid 4. Suspected 1.8 cm right thyroid nodule. Recommend thyroid US (ref: J Am Coll Radiol. 2015 Feb;12(2): 143-50). This may be performed non emergently. Electronically Signed   By: Donavan Foil M.D.   On: 03/22/2021 23:32      IMPRESSION:   *     jaundice, obstructive.  *    ? Cholecystitis.  *    hyponatremia.    PLAN:     *    Await MRI/MRCP which is about to be performed.  *     May need ERCP but lets wait on MR findings.  *    PT/INR.  *   General surgery consult pending.   Azucena Freed  03/23/2021, 8:30 AM Phone [phone]   Addendum 12:30 PM: MRI/MRCP:  1. Severely distended gallbladder with minimal  gallbladder wall thickening and inflammatory fat stranding in the adjacent gallbladder fossa. No calculi identified. Findings are concerning for acute cholecystitis. 2. There is severe intra and extrahepatic biliary ductal dilatation to the ampulla, the common bile duct measuring up to 1.6 cm in caliber. There is a relatively abrupt truncation of the common bile duct near the ampulla however no discrete mass or calculus is appreciated. ERCP may be helpful to evaluate for subtle ampullary mass.  GI ATTENDING  History, laboratories, x-rays reviewed.  Patient seen and examined.  Agree with comprehensive consultation note as outlined above.  Patient is sent to the hospital after incidentally being noted to have jaundice.  Liver tests are in an obstructive pattern.  Imaging reveals marked dilation of the biliary tree and gallbladder with abrupt cut off of the distal bile duct.  Primary concern would be for neoplasm.  Pancreatic duct is not dilated.  No obvious pancreatic mass.  Prothrombin time normal.  Plans for ERCP with sphincterotomy and possible stent placement/stone extraction (depending upon the findings) tomorrow.  The nature of the procedure, as well as the risks, benefits, and alternatives were carefully and thoroughly reviewed with the patient. Ample time for discussion and questions allowed. The patient understood, was satisfied, and agreed to proceed.  Docia Chuck. Geri Seminole., M.D. [facility] Division of Gastroenterology

## 2021-03-23 NOTE — ED Notes (Signed)
Nurse report given to Vickii Penna, RN

## 2021-03-23 NOTE — Progress Notes (Signed)
Pharmacy Antibiotic Note  Alicia Spencer is a 80 y.o. female admitted on 03/22/2021 with intra-abdominal infection.  Pharmacy has been consulted for Zosyn dosing. WBC WNL. Renal function good.   Plan: Zosyn 3.375G IV q8h to be infused over 4 hours Trend WBC, temp, renal function  F/U infectious work-up  Temp (24hrs), Avg:98.1 F (36.7 C), Min:97.7 F (36.5 C), Max:98.7 F (37.1 C)  Recent Labs  Lab 03/22/21 1602 03/23/21 0300  WBC 7.7 7.8  CREATININE 0.88 0.66    CrCl cannot be calculated (Unknown ideal weight.).    Allergies  Allergen Reactions  . Sulfites Anaphylaxis  . Iodine Other (See Comments)    welts   Abran Duke, PharmD, BCPS Clinical Pharmacist Phone: [phone]

## 2021-03-23 NOTE — Progress Notes (Addendum)
PROGRESS NOTE   Alicia Spencer  OZH:[id number]    DOB: 08-Jun-1941    DOA: 03/22/2021  PCP: Barbie BannerWilson, Fred H, MD   I have briefly reviewed patients previous medical records [facility].  Chief Complaint  Patient presents with  . Jaundice    Brief Narrative:   80 year old female with history of tobacco use disorder, no other known significant past medical history, not on prescription meds, presented to ED due to worsening epigastric and back pain of 1 week duration and nausea, vomiting and jaundice of 1 day duration.  History of mechanical fall approximately 2 weeks PTA, leading to mid back pain for which she had been taking OTC meds.  Admitted with diagnosis of possible obstructive jaundice, transaminitis and concern for acute cholecystitis.  [facility] evaluated and plan ERCP on 6/3.  General surgery consulted.   Assessment & Plan:  Active Problems:   Acute cholecystitis   Abdominal pain with obstructive jaundice, acute transaminitis, concern for acute cholecystitis: Admitting labs significant for alkaline phosphatase 1068, AST 578, ALT 458, total bilirubin 16.4.  Lipase normal.  INR 1.  Serum acetaminophen, salicylate level and blood alcohol levels negative.  Acute hepatitis panel negative.  CT thoracic spine 6/1: Markedly distended gallbladder.  CT chest abdomen and pelvis 6/1: Markedly distended gallbladder with surrounding soft tissue stranding, raising possibility of cholecystitis.  There is moderate to marked intra and extrahepatic biliary dilatation, suspicious for ductal obstruction.  MRCP 6/2: Severely distended gallbladder with minimal gallbladder wall thickening and inflammatory fat stranding in the adjacent gallbladder fossa.  No calculi identified.  Findings are concerning for acute cholecystitis.  Severe intra and extrahepatic biliary ductal dilatation to the ampulla, CBD measuring up to 1.6 cm in caliber.  Relatively abrupt truncation of the CBD near the ampulla however no  discrete mass or calculus appreciated.  GI input appreciated and plan ERCP 6/3.  General surgery input appreciated and do not feel that she has cholecystitis or cholangitis, have canceled HIDA scan and recommend stopping antibiotics.  Continue IV fluids.  Trend daily LFTs.  S/p recent mechanical fall/T5 compression fracture/acute nondisplaced left 11th posterior rib fracture: CT thoracic spine: 6/1 showed burst type fracture of T5 with approximately 50% height loss and 2 mm retropulsion.  No extension to the posterior elements.  Markedly distended gallbladder.  CT chest abdomen and pelvis 6/1: Severe compression fracture T5 and acute nondisplaced left 11th posterior rib fracture.  Other extensive imaging (chest x-ray with ribs, CT C-spine, CT head, CT L-spine) without acute abnormalities.  Supportive treatment with pain management, PT and OT evaluation.    Hyperammonemia/confusion: Hyperammonemia secondary to obstructive jaundice.  Minimal and less likely to be hepatic encephalopathy. Baseline mental status not known.  It is possible that patient has underlying cognitive impairment/possible dementia and needs to be verified with family.  Delirium precautions.  Dehydration with hyponatremia: Secondary to poor oral intake.  Continue IV saline hydration and follow BMP in a.m.  Tobacco use disorder: Cessation counseling when patient or family able to comprehend.  Continue nicotine patch.  Anemia: Could be dilutional.  Follow daily CBC.  Thyroid nodules: Bilateral thyroid nodules measuring up to 2 cm in the right thyroid lobe noted on CT C-spine 6/1.  CT chest abdomen and pelvis 6/1: Suspected 1.8 cm right thyroid nodule.  Recommend nonemergent thyroid ultrasound, which can be performed and followed up as outpatient.  Body mass index is 23.44 kg/m.    DVT prophylaxis: Place and maintain sequential compression device  Start: 03/23/21 1306 SCDs Start: 03/23/21 0047     Code Status: Full Code Family  Communication: None at bedside.  I was unable to reach patient's spouse via phone or even leave a VM message. Disposition:  Status is: Inpatient  Remains inpatient appropriate because:Inpatient level of care appropriate due to severity of illness   Dispo:  Patient From: Home  Planned Disposition: Home  Medically stable for discharge: No          Consultants:    GI General surgery  Procedures:   None  Antimicrobials:    Anti-infectives (From admission, onward)   Start     Dose/Rate Route Frequency Ordered Stop   03/23/21 0200  piperacillin-tazobactam (ZOSYN) IVPB 3.375 g        3.375 g 12.5 mL/hr over 240 Minutes Intravenous Every 8 hours 03/23/21 0159          Subjective:  Seen this morning while still in ED room.  Pleasantly confused.  Oriented only to self and may be partly to place.  Reports some RUQ area pain.  Also indicates that her husband noticed yellowish discoloration of skin and eyes prompting ED visit.  No family at bedside.  Objective:   Vitals:   03/23/21 0719 03/23/21 0836 03/23/21 1015 03/23/21 1329  BP: (!) 144/96  121/73 120/80  Pulse: 86  81 80  Resp: 18  18 18   Temp: 97.7 F (36.5 C)     TempSrc:      SpO2: 99%  94% 94%  Weight:  54.4 kg    Height:  5' (1.524 m)      General exam: Elderly female, moderately built and frail lying comfortably propped up in bed without distress.  Oral mucosa with borderline hydration.  Patient able to move around in her bed and sit up in bed without pain or discomfort. Respiratory system: Clear to auscultation. Respiratory effort normal. Cardiovascular system: S1 & S2 heard, RRR. No JVD, murmurs, rubs, gallops or clicks. No pedal edema. Gastrointestinal system: Abdomen is nondistended, soft.  Mild RUQ tenderness without rigidity, guarding or rebound.  Negative Murphy sign. No organomegaly or masses felt. Normal bowel sounds heard. Central nervous system: Alert and oriented only to self. No focal  neurological deficits. Extremities: Symmetric 5 x 5 power. Skin: No rashes, lesions or ulcers.  Right hip/thigh long vertical surgical scar. Psychiatry: Judgement and insight appear normal. Mood & affect appropriate.     Data Reviewed:   I have personally reviewed following labs and imaging studies   CBC: Recent Labs  Lab 03/22/21 1602 03/23/21 0300  WBC 7.7 7.8  NEUTROABS 5.0  --   HGB 12.5 11.5*  HCT 34.4* 32.3*  MCV 90.1 90.0  PLT 567* 557*    Basic Metabolic Panel: Recent Labs  Lab 03/22/21 1602 03/23/21 0300  NA 132* 133*  K 3.5 3.6  CL 96* 96*  CO2 25 24  GLUCOSE 116* 105*  BUN 15 13  CREATININE 0.88 0.66  CALCIUM 9.1 9.0  MG  --  2.1  PHOS  --  4.6    Liver Function Tests: Recent Labs  Lab 03/22/21 1602 03/23/21 0300  AST 578* 481*  ALT 458* 396*  ALKPHOS 1,068* 1,079*  BILITOT 16.4* 15.8*  PROT 6.7 6.8  ALBUMIN 3.2* 3.1*    CBG: No results for input(s): GLUCAP in the last 168 hours.  Microbiology Studies:   Recent Results (from the past 240 hour(s))  SARS CORONAVIRUS 2 (TAT 6-24 HRS) Nasopharyngeal Nasopharyngeal Swab  Status: None   Collection Time: 03/22/21 11:51 PM   Specimen: Nasopharyngeal Swab  Result Value Ref Range Status   SARS Coronavirus 2 NEGATIVE NEGATIVE Final    Comment: (NOTE) SARS-CoV-2 target nucleic acids are NOT DETECTED.  The SARS-CoV-2 RNA is generally detectable in upper and lower respiratory specimens during the acute phase of infection. Negative results do not preclude SARS-CoV-2 infection, do not rule out co-infections with other pathogens, and should not be used as the sole basis for treatment or other patient management decisions. Negative results must be combined with clinical observations, patient history, and epidemiological information. The expected result is Negative.  Fact Sheet for Patients: HairSlick.no  Fact Sheet for Healthcare  Providers: quierodirigir.com  This test is not yet approved or cleared by the [place] FDA and  has been authorized for detection and/or diagnosis of SARS-CoV-2 by FDA under an Emergency Use Authorization (EUA). This EUA will remain  in effect (meaning this test can be used) for the duration of the COVID-19 declaration under Se ction 564(b)(1) of the Act, 21 U.S.C. section 360bbb-3(b)(1), unless the authorization is terminated or revoked sooner.  Performed at [facility], [address], [place], [place] [address]      Radiology Studies:  DG Ribs Unilateral W/Chest Left  Result Date: 03/22/2021 CLINICAL DATA:  80 year old female with fall. EXAM: LEFT RIBS AND CHEST - 3+ VIEW COMPARISON:  None. FINDINGS: No focal consolidation, pleural effusion, or pneumothorax. The cardiac silhouette is within limits. Atherosclerotic calcification of the aorta. Osteopenia with degenerative changes of the spine. Evaluation for rib fracture is very limited due to advanced osteopenia. No obvious or displaced rib fracture identified. IMPRESSION: Negative. Electronically Signed   By: Elgie Collard M.D.   On: 03/22/2021 16:30   CT Head Wo Contrast  Result Date: 03/22/2021 CLINICAL DATA:  Fall EXAM: CT HEAD WITHOUT CONTRAST TECHNIQUE: Contiguous axial images were obtained from the base of the skull through the vertex without intravenous contrast. COMPARISON:  None. FINDINGS: Brain: There is atrophy and chronic small vessel disease changes. No acute intracranial abnormality. Specifically, no hemorrhage, hydrocephalus, mass lesion, acute infarction, or significant intracranial injury. Vascular: No hyperdense vessel or unexpected calcification. Skull: No acute calvarial abnormality. Sinuses/Orbits: Visualized paranasal sinuses and mastoids clear. Orbital soft tissues unremarkable. Other: None IMPRESSION: Atrophy, chronic microvascular disease. No acute intracranial abnormality.  Electronically Signed   By: Charlett Nose M.D.   On: 03/22/2021 22:50   CT Cervical Spine Wo Contrast  Result Date: 03/22/2021 CLINICAL DATA:  Fall EXAM: CT CERVICAL SPINE WITHOUT CONTRAST TECHNIQUE: Multidetector CT imaging of the cervical spine was performed without intravenous contrast. Multiplanar CT image reconstructions were also generated. COMPARISON:  None. FINDINGS: Alignment: No subluxation. Skull base and vertebrae: No acute fracture. No primary bone lesion or focal pathologic process. Soft tissues and spinal canal: No prevertebral fluid or swelling. No visible canal hematoma. Disc levels: Degenerative disc disease most pronounced at C5-6 and C6-7. Bilateral degenerative facet disease, left greater than right. Upper chest: No acute findings Other: Nodule replaces much of the right thyroid lobe measuring up to 2 cm. Smaller nodule in the left thyroid lobe measuring 9 mm. IMPRESSION: Degenerative disc and facet disease.  No acute bony abnormality. Bilateral thyroid nodules measuring up 2 cm in the right thyroid lobe. Recommend non emergent thyroid US (ref: J Am Coll Radiol. 2015 Feb;12(2): 143-50). Electronically Signed   By: Charlett Nose M.D.   On: 03/22/2021 22:53   MR 3D Recon At  Scanner  Result Date: 03/23/2021 CLINICAL DATA:  Jaundice, distended gallbladder on prior CT recent fall and trauma EXAM: MRI ABDOMEN WITHOUT AND WITH CONTRAST (INCLUDING MRCP) TECHNIQUE: Multiplanar multisequence MR imaging of the abdomen was performed both before and after the administration of intravenous contrast. Heavily T2-weighted images of the biliary and pancreatic ducts were obtained, and three-dimensional MRCP images were rendered by post processing. CONTRAST:  63mL GADAVIST GADOBUTROL 1 MMOL/ML IV SOLN COMPARISON:  CT chest abdomen pelvis, 03/22/2021 FINDINGS: Lower chest: No acute findings. Hepatobiliary: No mass or other parenchymal abnormality identified. Severely distended gallbladder. No calculi identified.  There is minimal gallbladder wall thickening and inflammatory fat stranding in the adjacent gallbladder fossa (series 10, image 23). There is severe intra and extrahepatic biliary ductal dilatation to the ampulla, the common bile duct measuring up to 1.6 cm in caliber. There is a relatively abrupt truncation of the common bile duct near the ampulla (series 16, image 33). Pancreas: The pancreatic parenchyma is mildly atrophic without discrete mass or other abnormality identified. The pancreatic duct is nondilated. Spleen:  Within normal limits in size and appearance. Adrenals/Urinary Tract: No masses identified. No evidence of hydronephrosis. Stomach/Bowel: Visualized portions within the abdomen are unremarkable. Vascular/Lymphatic: No pathologically enlarged lymph nodes identified. No abdominal aortic aneurysm demonstrated. Aortic atherosclerosis. Other:  None. Musculoskeletal: No suspicious bone lesions identified. IMPRESSION: 1. Severely distended gallbladder with minimal gallbladder wall thickening and inflammatory fat stranding in the adjacent gallbladder fossa. No calculi identified. Findings are concerning for acute cholecystitis. 2. There is severe intra and extrahepatic biliary ductal dilatation to the ampulla, the common bile duct measuring up to 1.6 cm in caliber. There is a relatively abrupt truncation of the common bile duct near the ampulla however no discrete mass or calculus is appreciated. ERCP may be helpful to evaluate for subtle ampullary mass. Electronically Signed   By: Lauralyn Primes M.D.   On: 03/23/2021 10:56   CT T-SPINE NO CHARGE  Result Date: 03/22/2021 CLINICAL DATA:  Fall EXAM: CT Thoracic and Lumbar spine without contrast TECHNIQUE: Multiplanar CT images of the thoracic and lumbar spine were reconstructed from contemporary CT of the Chest, Abdomen, and Pelvis CONTRAST:  No additional contrast COMPARISON:  None FINDINGS: CT THORACIC SPINE FINDINGS Alignment: Normal. Vertebrae: Burst  type fracture of T5 with approximately 50% height loss. 2 mm retropulsion. No extension to the posterior elements. Paraspinal and other soft tissues: Calcific aortic atherosclerosis. Disc levels: No spinal canal stenosis. CT LUMBAR SPINE FINDINGS Segmentation: 5 lumbar type vertebrae. Alignment: Grade 1 anterolisthesis at L4-5 Vertebrae: No acute fracture or focal pathologic process. Paraspinal and other soft tissues: Calcific aortic atherosclerosis. Markedly distended gallbladder. Disc levels: Mild spinal canal stenosis at L4-5 secondary to anterolisthesis caused by facet arthrosis. No neural impingement. Moderate right L5-S1 neural foraminal stenosis. IMPRESSION: 1. Burst type fracture of T5 with approximately 50% height loss and 2 mm retropulsion. No extension to the posterior elements. 2. No acute fracture of the lumbar spine. 3. Mild spinal canal stenosis at L4-5 secondary to anterolisthesis and facet arthrosis. 4. Moderate right L5-S1 neural foraminal stenosis. 5. Markedly distended gallbladder. Aortic Atherosclerosis (ICD10-I70.0). Electronically Signed   By: Deatra Robinson M.D.   On: 03/22/2021 22:59   CT L-SPINE NO CHARGE  Result Date: 03/22/2021 CLINICAL DATA:  Fall EXAM: CT Thoracic and Lumbar spine without contrast TECHNIQUE: Multiplanar CT images of the thoracic and lumbar spine were reconstructed from contemporary CT of the Chest, Abdomen, and Pelvis CONTRAST:  No additional contrast COMPARISON:  None FINDINGS: CT THORACIC SPINE FINDINGS Alignment: Normal. Vertebrae: Burst type fracture of T5 with approximately 50% height loss. 2 mm retropulsion. No extension to the posterior elements. Paraspinal and other soft tissues: Calcific aortic atherosclerosis. Disc levels: No spinal canal stenosis. CT LUMBAR SPINE FINDINGS Segmentation: 5 lumbar type vertebrae. Alignment: Grade 1 anterolisthesis at L4-5 Vertebrae: No acute fracture or focal pathologic process. Paraspinal and other soft tissues: Calcific  aortic atherosclerosis. Markedly distended gallbladder. Disc levels: Mild spinal canal stenosis at L4-5 secondary to anterolisthesis caused by facet arthrosis. No neural impingement. Moderate right L5-S1 neural foraminal stenosis. IMPRESSION: 1. Burst type fracture of T5 with approximately 50% height loss and 2 mm retropulsion. No extension to the posterior elements. 2. No acute fracture of the lumbar spine. 3. Mild spinal canal stenosis at L4-5 secondary to anterolisthesis and facet arthrosis. 4. Moderate right L5-S1 neural foraminal stenosis. 5. Markedly distended gallbladder. Aortic Atherosclerosis (ICD10-I70.0). Electronically Signed   By: Deatra Robinson M.D.   On: 03/22/2021 22:59   MR ABDOMEN MRCP W WO CONTAST  Result Date: 03/23/2021 CLINICAL DATA:  Jaundice, distended gallbladder on prior CT recent fall and trauma EXAM: MRI ABDOMEN WITHOUT AND WITH CONTRAST (INCLUDING MRCP) TECHNIQUE: Multiplanar multisequence MR imaging of the abdomen was performed both before and after the administration of intravenous contrast. Heavily T2-weighted images of the biliary and pancreatic ducts were obtained, and three-dimensional MRCP images were rendered by post processing. CONTRAST:  5mL GADAVIST GADOBUTROL 1 MMOL/ML IV SOLN COMPARISON:  CT chest abdomen pelvis, 03/22/2021 FINDINGS: Lower chest: No acute findings. Hepatobiliary: No mass or other parenchymal abnormality identified. Severely distended gallbladder. No calculi identified. There is minimal gallbladder wall thickening and inflammatory fat stranding in the adjacent gallbladder fossa (series 10, image 23). There is severe intra and extrahepatic biliary ductal dilatation to the ampulla, the common bile duct measuring up to 1.6 cm in caliber. There is a relatively abrupt truncation of the common bile duct near the ampulla (series 16, image 33). Pancreas: The pancreatic parenchyma is mildly atrophic without discrete mass or other abnormality identified. The  pancreatic duct is nondilated. Spleen:  Within normal limits in size and appearance. Adrenals/Urinary Tract: No masses identified. No evidence of hydronephrosis. Stomach/Bowel: Visualized portions within the abdomen are unremarkable. Vascular/Lymphatic: No pathologically enlarged lymph nodes identified. No abdominal aortic aneurysm demonstrated. Aortic atherosclerosis. Other:  None. Musculoskeletal: No suspicious bone lesions identified. IMPRESSION: 1. Severely distended gallbladder with minimal gallbladder wall thickening and inflammatory fat stranding in the adjacent gallbladder fossa. No calculi identified. Findings are concerning for acute cholecystitis. 2. There is severe intra and extrahepatic biliary ductal dilatation to the ampulla, the common bile duct measuring up to 1.6 cm in caliber. There is a relatively abrupt truncation of the common bile duct near the ampulla however no discrete mass or calculus is appreciated. ERCP may be helpful to evaluate for subtle ampullary mass. Electronically Signed   By: Lauralyn Primes M.D.   On: 03/23/2021 10:56   CT CHEST ABDOMEN PELVIS WO CONTRAST  Result Date: 03/22/2021 CLINICAL DATA:  Larey Seat and hit left chest EXAM: CT CHEST, ABDOMEN AND PELVIS WITHOUT CONTRAST TECHNIQUE: Multidetector CT imaging of the chest, abdomen and pelvis was performed following the standard protocol without IV contrast. COMPARISON:  None. FINDINGS: CT CHEST FINDINGS Cardiovascular: Limited evaluation without intravenous contrast. Advanced aortic atherosclerosis. No aneurysm. Coronary vascular calcification. Normal cardiac size. No pericardial effusion Mediastinum/Nodes: Midline trachea. Suspicion of 1.8 cm right thyroid nodule. No suspicious nodes. Esophagus within normal limits Lungs/Pleura:  No acute consolidation, pleural effusion, or pneumothorax. Musculoskeletal: Sternum is intact. Severe compression fracture of T5. Acute nondisplaced left eleventh posterior rib fracture. CT ABDOMEN PELVIS  FINDINGS Hepatobiliary: No focal hepatic abnormality. Markedly distended gallbladder with mild surrounding edema. No definite calcified stone. Intra and extrahepatic biliary dilatation, extrahepatic common bile duct dilated up to 15 mm. Pancreas: No inflammatory changes.  No ductal dilatation. Spleen: Normal in size without focal abnormality. Adrenals/Urinary Tract: Adrenal glands are unremarkable. Kidneys are normal, without renal calculi, focal lesion, or hydronephrosis. Bladder is unremarkable. Stomach/Bowel: Stomach is nonenlarged. No dilated small bowel. Fluid-filled bowel without obstructive pattern. No acute bowel wall thickening Vascular/Lymphatic: Advanced aortic atherosclerosis without aneurysm. No suspicious nodes. Reproductive: Large calcified left uterine fibroid.  No adnexal mass Other: Negative for free air or free fluid. Musculoskeletal: No evidence for pelvic fracture. Orthopedic hardware in the right femur. Grade 1 anterolisthesis L4 on L5. IMPRESSION: 1. No CT evidence for acute intrathoracic abnormality. Severe compression fracture T5, see separately dictated CT thoracic and lumbar reports. Acute nondisplaced left eleventh posterior rib fracture. 2. Markedly distended gallbladder with surrounding soft tissue stranding, raising possibility of cholecystitis. Recommend correlation with ultrasound. There is moderate to marked intra and extrahepatic biliary dilatation, suspicious for ductal obstruction, this may be evaluated with MRCP. 3. Large calcified uterine fibroid 4. Suspected 1.8 cm right thyroid nodule. Recommend thyroid US (ref: J Am Coll Radiol. 2015 Feb;12(2): 143-50). This may be performed non emergently. Electronically Signed   By: Jasmine Pang M.D.   On: 03/22/2021 23:32     Scheduled Meds:   . nicotine  7 mg Transdermal Daily    Continuous Infusions:   . dextrose 5% lactated ringers with KCl 20 mEq/L 50 mL/hr at 03/23/21 0150  . piperacillin-tazobactam (ZOSYN)  IV 3.375 g  (03/23/21 1308)     LOS: 1 day     Marcellus Scott, MD, [place], [facility]. [facility]    To contact the attending provider between 7A-7P or the covering provider during after hours 7P-7A, please log into the web site [email or web address] and access using universal [place] password for that web site. If you do not have the password, please call the hospital operator.  03/23/2021, 1:33 PM

## 2021-03-23 NOTE — ED Notes (Signed)
[place] bed alarm placed.

## 2021-03-23 NOTE — ED Notes (Signed)
Pt given phone call to daughter.

## 2021-03-23 NOTE — Consult Note (Signed)
Consult Note  Alicia Spencer [birth year]  [id number].    Requesting MD: Dr. Vernell Leep Chief Complaint/Reason for Consult: hyperbilirubinemia  HPI:  Patient is a 80 year old female who presented to [facility] s/p fall 1.5 weeks ago with right sided pain and epigastric pain. Patient reports taking 1 pill of tylenol daily to help manage pain from fall. In the ED she was noted to be jaundiced and workup was significant for hyperbilirubinemia and elevated transaminases. Gallbladder noted to be distended with some pericholecystic fluid on CT. Patient without leukocytosis or fever. Patient is a poor historian but able to tell me some details. Patient reports abdominal pain has been present for the last week and is primarily epigastric when it occurs. Pain is intermittent and not present now. She has had some nausea and only 1 episode of emesis that was NBNB. She is having bowel function and tolerating a diet. She reports that she was having some black stools about 1 month ago but stools are now more normal to light in color. She also reports that she was having darkened urine but that this improved. She noted that her skin and eyes were yellowing in the last 2 weeks. She denies itching. She has never had a colonoscopy or EGD. She reports that her father and her paternal grandmother both had cancer but she does not know what types of cancer. She has no personal history of any known malignancy. She reports she occasionally has 1 beer before bed. She smokes 1-2 cigarettes daily but has a 40+ year smoking history and worked for [employer]. She lives at home with her husband. She does not take any medications routinely but takes some herbal supplements, she is unable to tell me the names. She has had a cesarean section but no other abdominal surgeries.   ROS: Review of Systems  Constitutional: Negative for chills and fever.  Respiratory: Negative for shortness of breath and wheezing.    Cardiovascular: Negative for chest pain and palpitations.  Gastrointestinal: Positive for abdominal pain, melena and nausea. Negative for constipation, diarrhea and vomiting.  Genitourinary: Negative for dysuria, frequency and urgency.  Skin: Negative for itching.       Yellowing of skin and eyes  All other systems reviewed and are negative.   No family history on file.  No past medical history on file.  Social History:  has no history on file for tobacco use, alcohol use, and drug use.  Allergies:  Allergies  Allergen Reactions  . Sulfites Anaphylaxis  . Iodine Other (See Comments)    welts    (Not in a hospital admission)   Blood pressure (!) 144/96, pulse 86, temperature 97.7 F (36.5 C), resp. rate 18, height 5' (1.524 m), weight 54.4 kg, SpO2 99 %. Physical Exam:  General: pleasant, WD, cachectic female who is laying in bed in NAD HEENT: head is normocephalic, atraumatic.  Sclera are icteric.  PERRL.  Ears and nose without any masses or lesions.  Mouth is pink and moist Heart: regular, rate, and rhythm.  Normal s1,s2. No obvious murmurs, gallops, or rubs noted.  Palpable radial and pedal pulses bilaterally Lungs: CTAB, no wheezes, rhonchi, or rales noted.  Respiratory effort nonlabored Abd: soft, NT, ND, +BS, no masses, hernias, or organomegaly MS: all 4 extremities are symmetrical with no cyanosis, clubbing, or edema. Skin: warm and dry, jaundiced Neuro: Cranial nerves 2-12 grossly intact, sensation is normal throughout Psych: A&Ox4 but seems confused.   Results  for orders placed or performed during the hospital encounter of 03/22/21 (from the past 48 hour(s))  CBC with Differential     Status: Abnormal   Collection Time: 03/22/21  4:02 PM  Result Value Ref Range   WBC 7.7 4.0 - 10.5 K/uL   RBC 3.82 (L) 3.87 - 5.11 MIL/uL   Hemoglobin 12.5 12.0 - 15.0 g/dL   HCT 34.4 (L) 36.0 - 46.0 %   MCV 90.1 80.0 - 100.0 fL   MCH 32.7 26.0 - 34.0 pg   MCHC 36.3 (H) 30.0 -  36.0 g/dL   RDW 15.3 11.5 - 15.5 %   Platelets 567 (H) 150 - 400 K/uL   nRBC 0.0 0.0 - 0.2 %   Neutrophils Relative % 65 %   Neutro Abs 5.0 1.7 - 7.7 K/uL   Lymphocytes Relative 20 %   Lymphs Abs 1.5 0.7 - 4.0 K/uL   Monocytes Relative 10 %   Monocytes Absolute 0.8 0.1 - 1.0 K/uL   Eosinophils Relative 3 %   Eosinophils Absolute 0.2 0.0 - 0.5 K/uL   Basophils Relative 1 %   Basophils Absolute 0.1 0.0 - 0.1 K/uL   Immature Granulocytes 1 %   Abs Immature Granulocytes 0.11 (H) 0.00 - 0.07 K/uL    Comment: Performed at [facility], [address], [place], [place] [address]  Comprehensive metabolic panel     Status: Abnormal   Collection Time: 03/22/21  4:02 PM  Result Value Ref Range   Sodium 132 (L) 135 - 145 mmol/L   Potassium 3.5 3.5 - 5.1 mmol/L   Chloride 96 (L) 98 - 111 mmol/L   CO2 25 22 - 32 mmol/L   Glucose, Bld 116 (H) 70 - 99 mg/dL    Comment: Glucose reference range applies only to samples taken after fasting for at least 8 hours.   BUN 15 8 - 23 mg/dL   Creatinine, Ser 0.88 0.44 - 1.00 mg/dL   Calcium 9.1 8.9 - 10.3 mg/dL   Total Protein 6.7 6.5 - 8.1 g/dL   Albumin 3.2 (L) 3.5 - 5.0 g/dL   AST 578 (H) 15 - 41 U/L   ALT 458 (H) 0 - 44 U/L   Alkaline Phosphatase 1,068 (H) 38 - 126 U/L   Total Bilirubin 16.4 (H) 0.3 - 1.2 mg/dL   GFR, Estimated >60 >60 mL/min    Comment: (NOTE) Calculated using the CKD-EPI Creatinine Equation (2021)    Anion gap 11 5 - 15    Comment: Performed at [facility] [address], [place], [place] [address]  Lipase, blood     Status: None   Collection Time: 03/22/21  4:02 PM  Result Value Ref Range   Lipase 27 11 - 51 U/L    Comment: Performed at [facility], [place] [address] [place], [place] [address]  Urinalysis, Routine w reflex microscopic Urine, Clean Catch     Status: Abnormal   Collection Time: 03/22/21  8:19 PM  Result Value Ref Range   Color, Urine AMBER (A) YELLOW    Comment: BIOCHEMICALS MAY BE  AFFECTED BY COLOR   APPearance HAZY (A) CLEAR   Specific Gravity, Urine 1.013 1.005 - 1.030   pH 5.0 5.0 - 8.0   Glucose, UA NEGATIVE NEGATIVE mg/dL   Hgb urine dipstick NEGATIVE NEGATIVE   Bilirubin Urine MODERATE (A) NEGATIVE   Ketones, ur 5 (A) NEGATIVE mg/dL   Protein, ur NEGATIVE NEGATIVE mg/dL   Nitrite  NEGATIVE NEGATIVE   Leukocytes,Ua TRACE (A) NEGATIVE   RBC / HPF 0-5 0 - 5 RBC/hpf   WBC, UA 0-5 0 - 5 WBC/hpf   Bacteria, UA RARE (A) NONE SEEN   Squamous Epithelial / LPF 0-5 0 - 5   Mucus PRESENT    Hyaline Casts, UA PRESENT     Comment: Performed at [facility], [place] [address], [place], [place] [address]  Ethanol     Status: None   Collection Time: 03/22/21  9:50 PM  Result Value Ref Range   Alcohol, Ethyl (B) <10 <10 mg/dL    Comment: (NOTE) Lowest detectable limit for serum alcohol is 10 mg/dL.  For medical purposes only. Performed at [facility], [place] [address], [place], [place] [address]   Acetaminophen level     Status: Abnormal   Collection Time: 03/22/21  9:50 PM  Result Value Ref Range   Acetaminophen (Tylenol), Serum <10 (L) 10 - 30 ug/mL    Comment: (NOTE) Therapeutic concentrations vary significantly. A range of 10-30 ug/mL  may be an effective concentration for many patients. However, some  are best treated at concentrations outside of this range. Acetaminophen concentrations >150 ug/mL at 4 hours after ingestion  and >50 ug/mL at 12 hours after ingestion are often associated with  toxic reactions.  Performed at [facility], [place] [address] [place], [place] [address]   Salicylate level     Status: Abnormal   Collection Time: 03/22/21  9:50 PM  Result Value Ref Range   Salicylate Lvl <1.7 (L) 7.0 - 30.0 mg/dL    Comment: Performed at [facility] [address], [place], [place] [address]  Troponin I (High Sensitivity)     Status: None   Collection Time: 03/22/21  9:50 PM  Result Value Ref Range   Troponin I (High  Sensitivity) 15 <18 ng/L    Comment: (NOTE) Elevated high sensitivity troponin I (hsTnI) values and significant  changes across serial measurements may suggest ACS but many other  chronic and acute conditions are known to elevate hsTnI results.  Refer to the "Links" section for chest pain algorithms and additional  guidance. Performed at [facility], [place] [address] [place], [place] [address]   Ammonia     Status: Abnormal   Collection Time: 03/22/21  9:56 PM  Result Value Ref Range   Ammonia 44 (H) 9 - 35 umol/L    Comment: Performed at [facility], [place] [address] [place],  [address]  Hepatitis panel, acute     Status: None   Collection Time: 03/22/21 11:51 PM  Result Value Ref Range   Hepatitis B Surface Ag NON REACTIVE NON REACTIVE   HCV Ab NON REACTIVE NON REACTIVE    Comment: (NOTE) Nonreactive HCV antibody screen is consistent with no HCV infections,  unless recent infection is suspected or other evidence exists to indicate HCV infection.     Hep A IgM NON REACTIVE NON REACTIVE   Hep B C IgM NON REACTIVE NON REACTIVE    Comment: Performed at [facility], [place] [address] [place], [place] [address]  Troponin I (High Sensitivity)     Status: None   Collection Time: 03/22/21 11:51 PM  Result Value Ref Range   Troponin I (High Sensitivity) 17 <18 ng/L    Comment: (NOTE) Elevated high sensitivity troponin I (hsTnI) values and significant  changes across serial measurements may suggest ACS but  many other  chronic and acute conditions are known to elevate hsTnI results.  Refer to the "Links" section for chest pain algorithms and additional  guidance. Performed at [facility], [address] [place], [place] [address]   SARS CORONAVIRUS 2 (TAT 6-24 HRS) Nasopharyngeal Nasopharyngeal Swab     Status: None   Collection Time: 03/22/21 11:51 PM   Specimen: Nasopharyngeal Swab  Result Value Ref Range   SARS Coronavirus 2 NEGATIVE NEGATIVE     Comment: (NOTE) SARS-CoV-2 target nucleic acids are NOT DETECTED.  The SARS-CoV-2 RNA is generally detectable in upper and lower respiratory specimens during the acute phase of infection. Negative results do not preclude SARS-CoV-2 infection, do not rule out co-infections with other pathogens, and should not be used as the sole basis for treatment or other patient management decisions. Negative results must be combined with clinical observations, patient history, and epidemiological information. The expected result is Negative.  Fact Sheet for Patients: HairSlick.no  Fact Sheet for Healthcare Providers: quierodirigir.com  This test is not yet approved or cleared by the [place] FDA and  has been authorized for detection and/or diagnosis of SARS-CoV-2 by FDA under an Emergency Use Authorization (EUA). This EUA will remain  in effect (meaning this test can be used) for the duration of the COVID-19 declaration under Se ction 564(b)(1) of the Act, 21 U.S.C. section 360bbb-3(b)(1), unless the authorization is terminated or revoked sooner.  Performed at [facility], [address] [place], [place] [address]   Ammonia     Status: None   Collection Time: 03/23/21  1:19 AM  Result Value Ref Range   Ammonia 29 9 - 35 umol/L    Comment: Performed at [facility], [address] [place], [place] [address]  CBC     Status: Abnormal   Collection Time: 03/23/21  3:00 AM  Result Value Ref Range   WBC 7.8 4.0 - 10.5 K/uL   RBC 3.59 (L) 3.87 - 5.11 MIL/uL   Hemoglobin 11.5 (L) 12.0 - 15.0 g/dL   HCT 06.0 (L) 77.9 - 14.4 %   MCV 90.0 80.0 - 100.0 fL   MCH 32.0 26.0 - 34.0 pg   MCHC 35.6 30.0 - 36.0 g/dL   RDW 97.3 02.8 - 85.5 %   Platelets 557 (H) 150 - 400 K/uL   nRBC 0.0 0.0 - 0.2 %    Comment: Performed at [facility], [address] [place], [place] [address]  Comprehensive metabolic panel     Status:  Abnormal   Collection Time: 03/23/21  3:00 AM  Result Value Ref Range   Sodium 133 (L) 135 - 145 mmol/L   Potassium 3.6 3.5 - 5.1 mmol/L   Chloride 96 (L) 98 - 111 mmol/L   CO2 24 22 - 32 mmol/L   Glucose, Bld 105 (H) 70 - 99 mg/dL    Comment: Glucose reference range applies only to samples taken after fasting for at least 8 hours.   BUN 13 8 - 23 mg/dL   Creatinine, Ser 0.19 0.44 - 1.00 mg/dL   Calcium 9.0 8.9 - 73.6 mg/dL   Total Protein 6.8 6.5 - 8.1 g/dL   Albumin 3.1 (L) 3.5 - 5.0 g/dL   AST 149 (H) 15 - 41 U/L   ALT 396 (H) 0 - 44 U/L   Alkaline Phosphatase 1,079 (H) 38 - 126 U/L   Total Bilirubin 15.8 (H) 0.3 - 1.2 mg/dL  GFR, Estimated >60 >60 mL/min    Comment: (NOTE) Calculated using the CKD-EPI Creatinine Equation (2021)    Anion gap 13 5 - 15    Comment: Performed at [facility], [place] [address] [place], [place] [address]  Magnesium     Status: None   Collection Time: 03/23/21  3:00 AM  Result Value Ref Range   Magnesium 2.1 1.7 - 2.4 mg/dL    Comment: Performed at [facility], [place] [address], [place], [place] [address]  Phosphorus     Status: None   Collection Time: 03/23/21  3:00 AM  Result Value Ref Range   Phosphorus 4.6 2.5 - 4.6 mg/dL    Comment: Performed at [facility], [place] [address] [place] [address], [place], [place] [address]   DG Ribs Unilateral W/Chest Left  Result Date: 03/22/2021 CLINICAL DATA:  80 year old female with fall. EXAM: LEFT RIBS AND CHEST - 3+ VIEW COMPARISON:  None. FINDINGS: No focal consolidation, pleural effusion, or pneumothorax. The cardiac silhouette is within limits. Atherosclerotic calcification of the aorta. Osteopenia with degenerative changes of the spine. Evaluation for rib fracture is very limited due to advanced osteopenia. No obvious or displaced rib fracture identified. IMPRESSION: Negative. Electronically Signed   By: Anner Crete M.D.   On: 03/22/2021 16:30   CT Head Wo Contrast  Result Date: 03/22/2021 CLINICAL  DATA:  Fall EXAM: CT HEAD WITHOUT CONTRAST TECHNIQUE: Contiguous axial images were obtained from the base of the skull through the vertex without intravenous contrast. COMPARISON:  None. FINDINGS: Brain: There is atrophy and chronic small vessel disease changes. No acute intracranial abnormality. Specifically, no hemorrhage, hydrocephalus, mass lesion, acute infarction, or significant intracranial injury. Vascular: No hyperdense vessel or unexpected calcification. Skull: No acute calvarial abnormality. Sinuses/Orbits: Visualized paranasal sinuses and mastoids clear. Orbital soft tissues unremarkable. Other: None IMPRESSION: Atrophy, chronic microvascular disease. No acute intracranial abnormality. Electronically Signed   By: Rolm Baptise M.D.   On: 03/22/2021 22:50   CT Cervical Spine Wo Contrast  Result Date: 03/22/2021 CLINICAL DATA:  Fall EXAM: CT CERVICAL SPINE WITHOUT CONTRAST TECHNIQUE: Multidetector CT imaging of the cervical spine was performed without intravenous contrast. Multiplanar CT image reconstructions were also generated. COMPARISON:  None. FINDINGS: Alignment: No subluxation. Skull base and vertebrae: No acute fracture. No primary bone lesion or focal pathologic process. Soft tissues and spinal canal: No prevertebral fluid or swelling. No visible canal hematoma. Disc levels: Degenerative disc disease most pronounced at C5-6 and C6-7. Bilateral degenerative facet disease, left greater than right. Upper chest: No acute findings Other: Nodule replaces much of the right thyroid lobe measuring up to 2 cm. Smaller nodule in the left thyroid lobe measuring 9 mm. IMPRESSION: Degenerative disc and facet disease.  No acute bony abnormality. Bilateral thyroid nodules measuring up 2 cm in the right thyroid lobe. Recommend non emergent thyroid US (ref: J Am Coll Radiol. 2015 Feb;12(2): 143-50). Electronically Signed   By: Rolm Baptise M.D.   On: 03/22/2021 22:53   CT T-SPINE NO CHARGE  Result Date:  03/22/2021 CLINICAL DATA:  Fall EXAM: CT Thoracic and Lumbar spine without contrast TECHNIQUE: Multiplanar CT images of the thoracic and lumbar spine were reconstructed from contemporary CT of the Chest, Abdomen, and Pelvis CONTRAST:  No additional contrast COMPARISON:  None FINDINGS: CT THORACIC SPINE FINDINGS Alignment: Normal. Vertebrae: Burst type fracture of T5 with approximately 50% height loss. 2 mm retropulsion. No extension to the posterior elements. Paraspinal and other soft tissues: Calcific  aortic atherosclerosis. Disc levels: No spinal canal stenosis. CT LUMBAR SPINE FINDINGS Segmentation: 5 lumbar type vertebrae. Alignment: Grade 1 anterolisthesis at L4-5 Vertebrae: No acute fracture or focal pathologic process. Paraspinal and other soft tissues: Calcific aortic atherosclerosis. Markedly distended gallbladder. Disc levels: Mild spinal canal stenosis at L4-5 secondary to anterolisthesis caused by facet arthrosis. No neural impingement. Moderate right L5-S1 neural foraminal stenosis. IMPRESSION: 1. Burst type fracture of T5 with approximately 50% height loss and 2 mm retropulsion. No extension to the posterior elements. 2. No acute fracture of the lumbar spine. 3. Mild spinal canal stenosis at L4-5 secondary to anterolisthesis and facet arthrosis. 4. Moderate right L5-S1 neural foraminal stenosis. 5. Markedly distended gallbladder. Aortic Atherosclerosis (ICD10-I70.0). Electronically Signed   By: Ulyses Jarred M.D.   On: 03/22/2021 22:59   CT L-SPINE NO CHARGE  Result Date: 03/22/2021 CLINICAL DATA:  Fall EXAM: CT Thoracic and Lumbar spine without contrast TECHNIQUE: Multiplanar CT images of the thoracic and lumbar spine were reconstructed from contemporary CT of the Chest, Abdomen, and Pelvis CONTRAST:  No additional contrast COMPARISON:  None FINDINGS: CT THORACIC SPINE FINDINGS Alignment: Normal. Vertebrae: Burst type fracture of T5 with approximately 50% height loss. 2 mm retropulsion. No extension  to the posterior elements. Paraspinal and other soft tissues: Calcific aortic atherosclerosis. Disc levels: No spinal canal stenosis. CT LUMBAR SPINE FINDINGS Segmentation: 5 lumbar type vertebrae. Alignment: Grade 1 anterolisthesis at L4-5 Vertebrae: No acute fracture or focal pathologic process. Paraspinal and other soft tissues: Calcific aortic atherosclerosis. Markedly distended gallbladder. Disc levels: Mild spinal canal stenosis at L4-5 secondary to anterolisthesis caused by facet arthrosis. No neural impingement. Moderate right L5-S1 neural foraminal stenosis. IMPRESSION: 1. Burst type fracture of T5 with approximately 50% height loss and 2 mm retropulsion. No extension to the posterior elements. 2. No acute fracture of the lumbar spine. 3. Mild spinal canal stenosis at L4-5 secondary to anterolisthesis and facet arthrosis. 4. Moderate right L5-S1 neural foraminal stenosis. 5. Markedly distended gallbladder. Aortic Atherosclerosis (ICD10-I70.0). Electronically Signed   By: Ulyses Jarred M.D.   On: 03/22/2021 22:59   CT CHEST ABDOMEN PELVIS WO CONTRAST  Result Date: 03/22/2021 CLINICAL DATA:  [place] and hit left chest EXAM: CT CHEST, ABDOMEN AND PELVIS WITHOUT CONTRAST TECHNIQUE: Multidetector CT imaging of the chest, abdomen and pelvis was performed following the standard protocol without IV contrast. COMPARISON:  None. FINDINGS: CT CHEST FINDINGS Cardiovascular: Limited evaluation without intravenous contrast. Advanced aortic atherosclerosis. No aneurysm. Coronary vascular calcification. Normal cardiac size. No pericardial effusion Mediastinum/Nodes: Midline trachea. Suspicion of 1.8 cm right thyroid nodule. No suspicious nodes. Esophagus within normal limits Lungs/Pleura: No acute consolidation, pleural effusion, or pneumothorax. Musculoskeletal: Sternum is intact. Severe compression fracture of T5. Acute nondisplaced left eleventh posterior rib fracture. CT ABDOMEN PELVIS FINDINGS Hepatobiliary: No focal  hepatic abnormality. Markedly distended gallbladder with mild surrounding edema. No definite calcified stone. Intra and extrahepatic biliary dilatation, extrahepatic common bile duct dilated up to 15 mm. Pancreas: No inflammatory changes.  No ductal dilatation. Spleen: Normal in size without focal abnormality. Adrenals/Urinary Tract: Adrenal glands are unremarkable. Kidneys are normal, without renal calculi, focal lesion, or hydronephrosis. Bladder is unremarkable. Stomach/Bowel: Stomach is nonenlarged. No dilated small bowel. Fluid-filled bowel without obstructive pattern. No acute bowel wall thickening Vascular/Lymphatic: Advanced aortic atherosclerosis without aneurysm. No suspicious nodes. Reproductive: Large calcified left uterine fibroid.  No adnexal mass Other: Negative for free air or free fluid. Musculoskeletal: No evidence for pelvic fracture. Orthopedic hardware in the right femur.  Grade 1 anterolisthesis L4 on L5. IMPRESSION: 1. No CT evidence for acute intrathoracic abnormality. Severe compression fracture T5, see separately dictated CT thoracic and lumbar reports. Acute nondisplaced left eleventh posterior rib fracture. 2. Markedly distended gallbladder with surrounding soft tissue stranding, raising possibility of cholecystitis. Recommend correlation with ultrasound. There is moderate to marked intra and extrahepatic biliary dilatation, suspicious for ductal obstruction, this may be evaluated with MRCP. 3. Large calcified uterine fibroid 4. Suspected 1.8 cm right thyroid nodule. Recommend thyroid US (ref: J Am Coll Radiol. 2015 Feb;12(2): 143-50). This may be performed non emergently. Electronically Signed   By: Donavan Foil M.D.   On: 03/22/2021 23:32      Assessment/Plan Abdominal pain  Hyperammonemia  Hyperbilirubinemia with elevated transaminases - Tbili 15.8 this AM and Alk Phos 1079, AST/ALT 481/396 - hepatitis panel is negative - no leukocytosis and afebrile - I think  pericholecystic fluid seen on CT is likely just reactive. She does not appear to have cholecystitis or cholangitis - I have cancelled HIDA scan as this is not currently indicated - patient does not needs any further antibiotics from a surgical standpoint  - GI to consult and I have ordered MRCP to better evaluate biliary tree - need to delineate source of obstructive jaundice but this patient does not have any indications for emergent surgical intervention  - we will continue to follow   FEN: NPO, IVF VTE: SCDs, lovenox ID: [id number] 6/2>> I think this could be discontinued  - below per primary attending -  Tobacco use disorder R thyroid nodule 1.8 cm, incidentally found  T5 compression fracture L 11th rib fracture  S/P recent fall   Norm Parcel, [facility] 03/23/2021, 9:02 AM Please see Amion for pager number during day hours 7:00am-4:30pm

## 2021-03-23 NOTE — ED Notes (Signed)
Patient is a high fall risk, confused, and will not stay in bed. Bed alarm is on and active. Sitters are not available at this time. Order placed for a 1:1 observation sitter.

## 2021-03-24 ENCOUNTER — Encounter (HOSPITAL_COMMUNITY)
Admission: EM | Disposition: A | Discharge status: Home Health | Payer: Self-pay | Source: Home / Self Care | Attending: Internal Medicine

## 2021-03-24 ENCOUNTER — Inpatient Hospital Stay (HOSPITAL_COMMUNITY): Payer: Medicare Other | Admitting: Anesthesiology

## 2021-03-24 ENCOUNTER — Encounter (HOSPITAL_COMMUNITY): Payer: Self-pay | Admitting: Internal Medicine

## 2021-03-24 ENCOUNTER — Inpatient Hospital Stay (HOSPITAL_COMMUNITY): Payer: Medicare Other

## 2021-03-24 DIAGNOSIS — K831 Obstruction of bile duct: Secondary | ICD-10-CM

## 2021-03-24 DIAGNOSIS — G9341 Metabolic encephalopathy: Secondary | ICD-10-CM | POA: Diagnosis not present

## 2021-03-24 DIAGNOSIS — K269 Duodenal ulcer, unspecified as acute or chronic, without hemorrhage or perforation: Secondary | ICD-10-CM

## 2021-03-24 HISTORY — PX: BILIARY STENT PLACEMENT: SHX5538

## 2021-03-24 HISTORY — PX: SPHINCTEROTOMY: SHX5544

## 2021-03-24 HISTORY — PX: BILIARY BRUSHING: SHX6843

## 2021-03-24 HISTORY — PX: ENDOSCOPIC RETROGRADE CHOLANGIOPANCREATOGRAPHY (ERCP) WITH PROPOFOL: SHX5810

## 2021-03-24 HISTORY — PX: BIOPSY: SHX5522

## 2021-03-24 LAB — COMPREHENSIVE METABOLIC PANEL
ALT: 378 U/L — ABNORMAL HIGH (ref 0–44)
AST: 517 U/L — ABNORMAL HIGH (ref 15–41)
Albumin: 2.7 g/dL — ABNORMAL LOW (ref 3.5–5.0)
Alkaline Phosphatase: 1010 U/L — ABNORMAL HIGH (ref 38–126)
Anion gap: 10 (ref 5–15)
BUN: 11 mg/dL (ref 8–23)
CO2: 23 mmol/L (ref 22–32)
Calcium: 8.8 mg/dL — ABNORMAL LOW (ref 8.9–10.3)
Chloride: 104 mmol/L (ref 98–111)
Creatinine, Ser: 0.65 mg/dL (ref 0.44–1.00)
GFR, Estimated: >60 mL/min (ref >60)
Glucose, Bld: 95 mg/dL (ref 70–99)
Potassium: 3.4 mmol/L — ABNORMAL LOW (ref 3.5–5.1)
Sodium: 137 mmol/L (ref 135–145)
Total Bilirubin: 16.1 mg/dL — ABNORMAL HIGH (ref 0.3–1.2)
Total Protein: 6 g/dL — ABNORMAL LOW (ref 6.5–8.1)

## 2021-03-24 LAB — CBC
HCT: 31.5 % — ABNORMAL LOW (ref 36.0–46.0)
HCT: 31.1 % — ABNORMAL LOW (ref 36.0–46.0)
Hemoglobin: 11.8 g/dL — ABNORMAL LOW (ref 12.0–15.0)
Hemoglobin: 11.4 g/dL — ABNORMAL LOW (ref 12.0–15.0)
MCH: 32.8 pg (ref 26.0–34.0)
MCH: 32.3 pg (ref 26.0–34.0)
MCHC: 37.5 g/dL — ABNORMAL HIGH (ref 30.0–36.0)
MCHC: 36.7 g/dL — ABNORMAL HIGH (ref 30.0–36.0)
MCV: 87.5 fL (ref 80.0–100.0)
MCV: 88.1 fL (ref 80.0–100.0)
Platelets: 545 10*3/uL — ABNORMAL HIGH (ref 150–400)
Platelets: 574 10*3/uL — ABNORMAL HIGH (ref 150–400)
RBC: 3.6 MIL/uL — ABNORMAL LOW (ref 3.87–5.11)
RBC: 3.53 MIL/uL — ABNORMAL LOW (ref 3.87–5.11)
RDW: 16 % — ABNORMAL HIGH (ref 11.5–15.5)
RDW: 16.4 % — ABNORMAL HIGH (ref 11.5–15.5)
WBC: 6.7 10*3/uL (ref 4.0–10.5)
WBC: 7.5 10*3/uL (ref 4.0–10.5)
nRBC: 0 % (ref 0.0–0.2)
nRBC: 0 % (ref 0.0–0.2)

## 2021-03-24 SURGERY — ENDOSCOPIC RETROGRADE CHOLANGIOPANCREATOGRAPHY (ERCP) WITH PROPOFOL
Anesthesia: General

## 2021-03-24 MED ORDER — POTASSIUM CHLORIDE CRYS ER 20 MEQ PO TBCR
40.0000 meq | EXTENDED_RELEASE_TABLET | Freq: Once | ORAL | Status: AC
  Administered 2021-03-24: 40 meq via ORAL
  Filled 2021-03-24: qty 2

## 2021-03-24 MED ORDER — GLUCAGON HCL RDNA (DIAGNOSTIC) 1 MG IJ SOLR
INTRAMUSCULAR | Status: AC
  Filled 2021-03-24: qty 1

## 2021-03-24 MED ORDER — PROPOFOL 10 MG/ML IV BOLUS
INTRAVENOUS | Status: DC | PRN
  Administered 2021-03-24: 100 mg via INTRAVENOUS

## 2021-03-24 MED ORDER — INDOMETHACIN 50 MG RE SUPP
RECTAL | Status: AC
  Filled 2021-03-24: qty 2

## 2021-03-24 MED ORDER — CIPROFLOXACIN IN D5W 400 MG/200ML IV SOLN
INTRAVENOUS | Status: DC | PRN
  Administered 2021-03-24: 400 mg via INTRAVENOUS

## 2021-03-24 MED ORDER — ONDANSETRON HCL 4 MG/2ML IJ SOLN
INTRAMUSCULAR | Status: DC | PRN
  Administered 2021-03-24: 4 mg via INTRAVENOUS

## 2021-03-24 MED ORDER — CIPROFLOXACIN IN D5W 400 MG/200ML IV SOLN
INTRAVENOUS | Status: AC
  Filled 2021-03-24: qty 200

## 2021-03-24 MED ORDER — LIDOCAINE 2% (20 MG/ML) 5 ML SYRINGE
INTRAMUSCULAR | Status: DC | PRN
  Administered 2021-03-24: 60 mg via INTRAVENOUS

## 2021-03-24 MED ORDER — SODIUM CHLORIDE 0.9 % IV SOLN
INTRAVENOUS | Status: DC | PRN
  Administered 2021-03-24: 70 mL

## 2021-03-24 MED ORDER — ROCURONIUM BROMIDE 10 MG/ML (PF) SYRINGE
PREFILLED_SYRINGE | INTRAVENOUS | Status: DC | PRN
  Administered 2021-03-24: 60 mg via INTRAVENOUS

## 2021-03-24 MED ORDER — SUGAMMADEX SODIUM 200 MG/2ML IV SOLN
INTRAVENOUS | Status: DC | PRN
  Administered 2021-03-24: 150 mg via INTRAVENOUS

## 2021-03-24 MED ORDER — LACTATED RINGERS IV SOLN: INTRAVENOUS | Status: DC

## 2021-03-24 MED ORDER — INDOMETHACIN 50 MG RE SUPP
RECTAL | Status: DC | PRN
  Administered 2021-03-24: 100 mg via RECTAL

## 2021-03-24 MED ORDER — PHENYLEPHRINE 40 MCG/ML (10ML) SYRINGE FOR IV PUSH (FOR BLOOD PRESSURE SUPPORT)
PREFILLED_SYRINGE | INTRAVENOUS | Status: DC | PRN
  Administered 2021-03-24 (×3): 80 ug via INTRAVENOUS

## 2021-03-24 MED ORDER — SODIUM CHLORIDE 0.9 % IV SOLN: INTRAVENOUS | Status: AC

## 2021-03-24 NOTE — Care Management Important Message (Signed)
Important Message  Patient Details  Name: Alicia Spencer MRN: [id number] Date of Birth: 1940-11-24   Medicare Important Message Given:  Yes     Dorena Bodo 03/24/2021, 2:49 PM

## 2021-03-24 NOTE — Evaluation (Signed)
Occupational Therapy Evaluation Patient Details Name: Alicia Spencer MRN: [id number] DOB: 08/06/1941 Today's Date: 03/24/2021    History of Present Illness 80 yo female with recent mechanical fall at home was sent to hosp for acute hyperbilirubinemia, elevated transaminases, hyperammonemia.  Has ERCP scheduled today.  PMHx:  T5 compression fracture, L 11th rib fracture, R thyroid nodule,   Clinical Impression   Pt admitted to ED for concerns listed above. PTA pt reported independence with all ADL's and IADL's. At the time of the evaluation, pt demonstrated continued independence, using a RW for safety. Pt completed toileting, dressing, and grooming with no difficulties as well as demonstrated ROM WFL for all other tasks. At this time pt does not need OT services and acute OT will sign off. Thanks for the referral.     Follow Up Recommendations  No OT follow up;Supervision - Intermittent    Equipment Recommendations  3 in 1 bedside commode    Recommendations for Other Services       Precautions / Restrictions Precautions Precautions: Fall Precaution Comments: T5, L 11th rib fx Restrictions Weight Bearing Restrictions: No      Mobility Bed Mobility Overal bed mobility: Modified Independent Bed Mobility: Supine to Sit;Sit to Supine     Supine to sit: Min guard Sit to supine: Min guard   General bed mobility comments: No physical assistance given    Transfers Overall transfer level: Modified independent Equipment used: Rolling walker (2 wheeled) Transfers: Sit to/from Stand Sit to Stand: Modified independent (Device/Increase time)         General transfer comment: Used RW, reequired cues x1 to push u pfrom bed.    Balance Overall balance assessment: History of Falls;Mild deficits observed, not formally tested Sitting-balance support: Feet supported Sitting balance-Leahy Scale: Fair     Standing balance support: Single extremity supported Standing balance-Leahy  Scale: Fair                             ADL either performed or assessed with clinical judgement   ADL Overall ADL's : Modified independent;At baseline                                       General ADL Comments: Pt completed toileting with no difficulties, grooming standing at sink, dressing sitting EOB, and has ROM WFL for bathing tasks. Additionally pt was able to ambulate 3 laps around the room using RW with no difficulty reporting no fatigue.     Vision Baseline Vision/History: Wears glasses Wears Glasses: Reading only Patient Visual Report: No change from baseline Vision Assessment?: No apparent visual deficits     Perception Perception Perception Tested?: No   Praxis Praxis Praxis tested?: Not tested    Pertinent Vitals/Pain Pain Assessment: 0-10 Pain Score: 2  Pain Location: Sternal pain Pain Descriptors / Indicators: Aching Pain Intervention(s): Monitored during session     Hand Dominance Right   Extremity/Trunk Assessment Upper Extremity Assessment Upper Extremity Assessment: Overall WFL for tasks assessed   Lower Extremity Assessment Lower Extremity Assessment: Defer to PT evaluation   Cervical / Trunk Assessment Cervical / Trunk Assessment: Normal   Communication Communication Communication: No difficulties   Cognition Arousal/Alertness: Awake/alert Behavior During Therapy: Impulsive Overall Cognitive Status: Difficult to assess  General Comments: Husband and daughter present, no comment on pt's cognition   General Comments  VSS on RA, pain levels very minimal    Exercises Exercises: Other exercises (LE strength grossly WFL)   Shoulder Instructions      Home Living Family/patient expects to be discharged to:: Private residence Living Arrangements: Spouse/significant other Available Help at Discharge: Family;Available 24 hours/day Type of Home: House Home Access: Stairs  to enter [employer] of Steps: 2 Entrance Stairs-Rails: Can reach both Home Layout: Two level;Able to live on main level with bedroom/bathroom Alternate Level Stairs-Number of Steps: Flight Alternate Level Stairs-Rails: Can reach both Bathroom Shower/Tub: Chief Strategy Officer: Handicapped height Bathroom Accessibility: Yes How Accessible: Accessible via walker Home Equipment: Walker - 2 wheels;Cane - single point;Grab bars - tub/shower   Additional Comments: pt and husband report walking distances and driving      Prior Functioning/Environment Level of Independence: Independent        Comments: despite independence is falling often        OT Problem List: Decreased strength;Decreased activity tolerance;Decreased cognition;Decreased safety awareness;Decreased knowledge of use of DME or AE      OT Treatment/Interventions:      OT Goals(Current goals can be found in the care plan section) Acute Rehab OT Goals Patient Stated Goal: to get home OT Goal Formulation: All assessment and education complete, DC therapy Time For Goal Achievement: 03/24/21 Potential to Achieve Goals: Good  OT Frequency:     Barriers to D/C:            Co-evaluation              AM-PAC OT "6 Clicks" Daily Activity     Outcome Measure Help from another person eating meals?: None Help from another person taking care of personal grooming?: None Help from another person toileting, which includes using toliet, bedpan, or urinal?: None Help from another person bathing (including washing, rinsing, drying)?: None Help from another person to put on and taking off regular upper body clothing?: None Help from another person to put on and taking off regular lower body clothing?: None 6 Click Score: 24   End of Session Equipment Utilized During Treatment: Rolling walker Nurse Communication: Mobility status  Activity Tolerance: Patient tolerated treatment well Patient left:  in bed;with call bell/phone within reach;with nursing/sitter in room;with family/visitor present  OT Visit Diagnosis: Unsteadiness on feet (R26.81);History of falling (Z91.81);Muscle weakness (generalized) (M62.81)                Time: 8372-9021 OT Time Calculation (min): 23 min Charges:  OT General Charges $OT Visit: 1 Visit OT Evaluation $OT Eval Low Complexity: 1 Low OT Treatments $Self Care/Home Management : 8-22 mins  Shamaya Kauer H., OTR/L Acute Rehabilitation  Neco Kling Elane Voris Tigert 03/24/2021, 7:10 PM

## 2021-03-24 NOTE — Progress Notes (Signed)
Physical Therapy Evaluation Patient Details Name: Alicia Spencer MRN: [id number] DOB: [birth year] Today's Date: 03/24/2021   History of Present Illness  80 yo female with recent mechanical fall at home was sent to hosp for acute hyperbilirubinemia, elevated transaminases, hyperammonemia.  Has ERCP scheduled today.  PMHx:  T5 compression fracture, L 11th rib fracture, R thyroid nodule,  Clinical Impression  Pt was seen for initiating mobility with concerns due to her impulsivity and mild confusion.  Pt is requiring redirection for all movement, esp notable with avoiding standing before PT had set up her environment to be ready.  Plan is to send HHPT to her to work on her mild instability and to monitor her safety.  Pt is home with husband who appears to be a bit distracted and may need this help to encourage her to be aware of fall risks and progress mobility.  See for acute PT goals, work on stairs when ready and encourage her to be OOB in chair as tolerated.    Follow Up Recommendations Home health PT;Supervision for mobility/OOB    Equipment Recommendations  None recommended by PT    Recommendations for Other Services       Precautions / Restrictions Precautions Precautions: Fall Precaution Comments: T5, L 11th rib fx Restrictions Weight Bearing Restrictions: No      Mobility  Bed Mobility Overal bed mobility: Needs Assistance Bed Mobility: Supine to Sit;Sit to Supine     Supine to sit: Min guard Sit to supine: Min guard   General bed mobility comments: minor help to roll and protect injuries    Transfers Overall transfer level: Needs assistance Equipment used: 1 person hand held assist Transfers: Sit to/from Stand Sit to Stand: Min guard (for safety)         General transfer comment: Pt is up to walk with help to remain steadied, safety awareness  Ambulation/Gait Ambulation/Gait assistance: Min guard Gait Distance (Feet): 75 Feet Assistive device: 1 person hand  held assist Gait Pattern/deviations: Step-through pattern;Drifts right/left;Narrow base of support Gait velocity: reduced Gait velocity interpretation: <1.31 ft/sec, indicative of household ambulator General Gait Details: pt is walking with unpredictable follow through on her mobility, tends to forget directions but can follow them when repeated  Stairs            Wheelchair Mobility    Modified Rankin (Stroke Patients Only)       Balance Overall balance assessment: History of Falls;Needs assistance Sitting-balance support: Feet supported Sitting balance-Leahy Scale: Fair     Standing balance support: Single extremity supported Standing balance-Leahy Scale: Fair                               Pertinent Vitals/Pain Pain Assessment: No/denies pain    Home Living Family/patient expects to be discharged to:: Private residence Living Arrangements: Spouse/significant other Available Help at Discharge: Family;Available 24 hours/day Type of Home: House Home Access: Stairs to enter Entrance Stairs-Rails: Left;Can reach Therapist, art of Steps: 2 Home Layout: Other (Comment);Two level (split level) Home Equipment: None Additional Comments: pt and husband report walking distances and driving    Prior Function Level of Independence: Independent         Comments: despite independence is falling often     Hand Dominance   Dominant Hand: Right    Extremity/Trunk Assessment   Upper Extremity Assessment Upper Extremity Assessment: Overall WFL for tasks assessed    Lower Extremity Assessment  Lower Extremity Assessment: Overall WFL for tasks assessed    Cervical / Trunk Assessment Cervical / Trunk Assessment: Kyphotic  Communication   Communication: No difficulties  Cognition Arousal/Alertness: Awake/alert Behavior During Therapy: Impulsive Overall Cognitive Status: Difficult to assess                                  General Comments: husband is present but unclear if he feels wife is baseline      General Comments General comments (skin integrity, edema, etc.): pt has no outright LOB to walk but supervised for safety since pt is mildly confused    Exercises     Assessment/Plan    PT Assessment Patient needs continued PT services  PT Problem List Decreased range of motion;Decreased balance;Decreased coordination;Decreased safety awareness       PT Treatment Interventions DME instruction;Gait training;Stair training;Functional mobility training;Therapeutic activities;Therapeutic exercise;Balance training;Neuromuscular re-education;Patient/family education    PT Goals (Current goals can be found in the Care Plan section)  Acute Rehab PT Goals Patient Stated Goal: to get home PT Goal Formulation: With patient/family Time For Goal Achievement: 03/31/21 Potential to Achieve Goals: Good    Frequency Min 3X/week   Barriers to discharge Inaccessible home environment;Decreased caregiver support home with stairs and husband is presenting as possibly confused a bit    Co-evaluation               AM-PAC PT "6 Clicks" Mobility  Outcome Measure Help needed turning from your back to your side while in a flat bed without using bedrails?: None Help needed moving from lying on your back to sitting on the side of a flat bed without using bedrails?: A Little Help needed moving to and from a bed to a chair (including a wheelchair)?: A Little Help needed standing up from a chair using your arms (e.g., wheelchair or bedside chair)?: A Little Help needed to walk in hospital room?: A Little Help needed climbing 3-5 steps with a railing? : A Lot 6 Click Score: 18    End of Session Equipment Utilized During Treatment: Gait belt Activity Tolerance: Patient tolerated treatment well Patient left: in bed;with call bell/phone within reach;with bed alarm set;with family/visitor present;with nursing/sitter in  room Nurse Communication: Mobility status PT Visit Diagnosis: Unsteadiness on feet (R26.81);History of falling (Z91.81)    Time: 5852-7782 PT Time Calculation (min) (ACUTE ONLY): 24 min   Charges:   PT Evaluation $PT Eval Moderate Complexity: 1 Mod PT Treatments $Gait Training: 8-22 mins       Ivar Drape 03/24/2021, 4:24 PM  Samul Dada, PT MS Acute Rehab Dept. Number: [facility] [id number] and [facility] [phone]

## 2021-03-24 NOTE — Progress Notes (Addendum)
Daily Rounding Note  03/24/2021, 10:51 AM  LOS: 2 days   SUBJECTIVE:   Chief complaint: Jaundice.     Denied abdominal pain and nausea to staff earlier today.  Currently NPO but tolerated clears.  Currently sleeping and did not awaken her to examine or speak with her.  Asking staff when she can go home.  Sitter in room due to confusion and agitation treated with Haldol yesterday.  OBJECTIVE:         Vital signs in last 24 hours:    Temp:  [97.8 F (36.6 C)-98.9 F (37.2 C)] 97.8 F (36.6 C) (06/03 0715) Pulse Rate:  [75-90] 75 (06/03 0715) Resp:  [16-22] 22 (06/03 0715) BP: (120-157)/(67-81) 157/79 (06/03 0715) SpO2:  [94 %-96 %] 96 % (06/03 0715) Last BM Date: 03/22/21 Filed Weights   03/23/21 0836  Weight: 54.4 kg   General: Resting quietly with no labored breathing. Did not reexamine patient.  Intake/Output from previous day: 06/02 0701 - 06/03 0700 In: 1557.7 [I.V.:1500; IV Piggyback:57.7] Out: -   Intake/Output this shift: No intake/output data recorded.  Lab Results: Recent Labs    03/22/21 1602 03/23/21 0300 03/24/21 0649  WBC 7.7 7.8 6.7  HGB 12.5 11.5* 11.8*  HCT 34.4* 32.3* 31.5*  PLT 567* 557* 545*   BMET Recent Labs    03/22/21 1602 03/23/21 0300 03/24/21 0649  NA 132* 133* 137  K 3.5 3.6 3.4*  CL 96* 96* 104  CO2 _0 GLUCOSE 116* 105* 95  BUN _1 CREATININE 0.88 0.66 0.65  CALCIUM 9.1 9.0 8.8*   LFT Recent Labs    03/22/21 1602 03/23/21 0300 03/24/21 0649  PROT 6.7 6.8 6.0*  ALBUMIN 3.2* 3.1* 2.7*  AST 578* 481* 517*  ALT 458* 396* 378*  ALKPHOS 1,068* 1,079* 1,010*  BILITOT 16.4* 15.8* 16.1*   PT/INR Recent Labs    03/23/21 0930  LABPROT 13.6  INR 1.0   Hepatitis Panel Recent Labs    03/22/21 2351  HEPBSAG NON REACTIVE  HCVAB NON REACTIVE  HEPAIGM NON REACTIVE  HEPBIGM NON REACTIVE    Studies/Results: DG Ribs Unilateral W/Chest  Left  Result Date: 03/22/2021 CLINICAL DATA:  80 year old female with fall. EXAM: LEFT RIBS AND CHEST - 3+ VIEW COMPARISON:  None. FINDINGS: No focal consolidation, pleural effusion, or pneumothorax. The cardiac silhouette is within limits. Atherosclerotic calcification of the aorta. Osteopenia with degenerative changes of the spine. Evaluation for rib fracture is very limited due to advanced osteopenia. No obvious or displaced rib fracture identified. IMPRESSION: Negative. Electronically Signed   By: Anner Crete M.D.   On: 03/22/2021 16:30   CT Head Wo Contrast  Result Date: 03/22/2021 CLINICAL DATA:  Fall EXAM: CT HEAD WITHOUT CONTRAST TECHNIQUE: Contiguous axial images were obtained from the base of the skull through the vertex without intravenous contrast. COMPARISON:  None. FINDINGS: Brain: There is atrophy and chronic small vessel disease changes. No acute intracranial abnormality. Specifically, no hemorrhage, hydrocephalus, mass lesion, acute infarction, or significant intracranial injury. Vascular: No hyperdense vessel or unexpected calcification. Skull: No acute calvarial abnormality. Sinuses/Orbits: Visualized paranasal sinuses and mastoids clear. Orbital soft tissues unremarkable. Other: None IMPRESSION: Atrophy, chronic microvascular disease. No acute intracranial abnormality. Electronically Signed   By: Rolm Baptise M.D.   On: 03/22/2021 22:50   CT Cervical Spine Wo Contrast  Result Date: 03/22/2021 CLINICAL DATA:  Fall EXAM: CT CERVICAL SPINE WITHOUT CONTRAST TECHNIQUE: Multidetector  CT imaging of the cervical spine was performed without intravenous contrast. Multiplanar CT image reconstructions were also generated. COMPARISON:  None. FINDINGS: Alignment: No subluxation. Skull base and vertebrae: No acute fracture. No primary bone lesion or focal pathologic process. Soft tissues and spinal canal: No prevertebral fluid or swelling. No visible canal hematoma. Disc levels: Degenerative disc  disease most pronounced at C5-6 and C6-7. Bilateral degenerative facet disease, left greater than right. Upper chest: No acute findings Other: Nodule replaces much of the right thyroid lobe measuring up to 2 cm. Smaller nodule in the left thyroid lobe measuring 9 mm. IMPRESSION: Degenerative disc and facet disease.  No acute bony abnormality. Bilateral thyroid nodules measuring up 2 cm in the right thyroid lobe. Recommend non emergent thyroid US (ref: J Am Coll Radiol. 2015 Feb;12(2): 143-50). Electronically Signed   By: Rolm Baptise M.D.   On: 03/22/2021 22:53   MR 3D Recon At Scanner  Result Date: 03/23/2021 CLINICAL DATA:  Jaundice, distended gallbladder on prior CT recent fall and trauma EXAM: MRI ABDOMEN WITHOUT AND WITH CONTRAST (INCLUDING MRCP) TECHNIQUE: Multiplanar multisequence MR imaging of the abdomen was performed both before and after the administration of intravenous contrast. Heavily T2-weighted images of the biliary and pancreatic ducts were obtained, and three-dimensional MRCP images were rendered by post processing. CONTRAST:  61m GADAVIST GADOBUTROL 1 MMOL/ML IV SOLN COMPARISON:  CT chest abdomen pelvis, 03/22/2021 FINDINGS: Lower chest: No acute findings. Hepatobiliary: No mass or other parenchymal abnormality identified. Severely distended gallbladder. No calculi identified. There is minimal gallbladder wall thickening and inflammatory fat stranding in the adjacent gallbladder fossa (series 10, image 23). There is severe intra and extrahepatic biliary ductal dilatation to the ampulla, the common bile duct measuring up to 1.6 cm in caliber. There is a relatively abrupt truncation of the common bile duct near the ampulla (series 16, image 33). Pancreas: The pancreatic parenchyma is mildly atrophic without discrete mass or other abnormality identified. The pancreatic duct is nondilated. Spleen:  Within normal limits in size and appearance. Adrenals/Urinary Tract: No masses identified. No  evidence of hydronephrosis. Stomach/Bowel: Visualized portions within the abdomen are unremarkable. Vascular/Lymphatic: No pathologically enlarged lymph nodes identified. No abdominal aortic aneurysm demonstrated. Aortic atherosclerosis. Other:  None. Musculoskeletal: No suspicious bone lesions identified. IMPRESSION: 1. Severely distended gallbladder with minimal gallbladder wall thickening and inflammatory fat stranding in the adjacent gallbladder fossa. No calculi identified. Findings are concerning for acute cholecystitis. 2. There is severe intra and extrahepatic biliary ductal dilatation to the ampulla, the common bile duct measuring up to 1.6 cm in caliber. There is a relatively abrupt truncation of the common bile duct near the ampulla however no discrete mass or calculus is appreciated. ERCP may be helpful to evaluate for subtle ampullary mass. Electronically Signed   By: AEddie CandleM.D.   On: 03/23/2021 10:56   CT T-SPINE NO CHARGE  Result Date: 03/22/2021 CLINICAL DATA:  Fall EXAM: CT Thoracic and Lumbar spine without contrast TECHNIQUE: Multiplanar CT images of the thoracic and lumbar spine were reconstructed from contemporary CT of the Chest, Abdomen, and Pelvis CONTRAST:  No additional contrast COMPARISON:  None FINDINGS: CT THORACIC SPINE FINDINGS Alignment: Normal. Vertebrae: Burst type fracture of T5 with approximately 50% height loss. 2 mm retropulsion. No extension to the posterior elements. Paraspinal and other soft tissues: Calcific aortic atherosclerosis. Disc levels: No spinal canal stenosis. CT LUMBAR SPINE FINDINGS Segmentation: 5 lumbar type vertebrae. Alignment: Grade 1 anterolisthesis at L4-5 Vertebrae: No acute fracture or  focal pathologic process. Paraspinal and other soft tissues: Calcific aortic atherosclerosis. Markedly distended gallbladder. Disc levels: Mild spinal canal stenosis at L4-5 secondary to anterolisthesis caused by facet arthrosis. No neural impingement. Moderate  right L5-S1 neural foraminal stenosis. IMPRESSION: 1. Burst type fracture of T5 with approximately 50% height loss and 2 mm retropulsion. No extension to the posterior elements. 2. No acute fracture of the lumbar spine. 3. Mild spinal canal stenosis at L4-5 secondary to anterolisthesis and facet arthrosis. 4. Moderate right L5-S1 neural foraminal stenosis. 5. Markedly distended gallbladder. Aortic Atherosclerosis (ICD10-I70.0). Electronically Signed   By: Ulyses Jarred M.D.   On: 03/22/2021 22:59   CT L-SPINE NO CHARGE  Result Date: 03/22/2021 CLINICAL DATA:  Fall EXAM: CT Thoracic and Lumbar spine without contrast TECHNIQUE: Multiplanar CT images of the thoracic and lumbar spine were reconstructed from contemporary CT of the Chest, Abdomen, and Pelvis CONTRAST:  No additional contrast COMPARISON:  None FINDINGS: CT THORACIC SPINE FINDINGS Alignment: Normal. Vertebrae: Burst type fracture of T5 with approximately 50% height loss. 2 mm retropulsion. No extension to the posterior elements. Paraspinal and other soft tissues: Calcific aortic atherosclerosis. Disc levels: No spinal canal stenosis. CT LUMBAR SPINE FINDINGS Segmentation: 5 lumbar type vertebrae. Alignment: Grade 1 anterolisthesis at L4-5 Vertebrae: No acute fracture or focal pathologic process. Paraspinal and other soft tissues: Calcific aortic atherosclerosis. Markedly distended gallbladder. Disc levels: Mild spinal canal stenosis at L4-5 secondary to anterolisthesis caused by facet arthrosis. No neural impingement. Moderate right L5-S1 neural foraminal stenosis. IMPRESSION: 1. Burst type fracture of T5 with approximately 50% height loss and 2 mm retropulsion. No extension to the posterior elements. 2. No acute fracture of the lumbar spine. 3. Mild spinal canal stenosis at L4-5 secondary to anterolisthesis and facet arthrosis. 4. Moderate right L5-S1 neural foraminal stenosis. 5. Markedly distended gallbladder. Aortic Atherosclerosis (ICD10-I70.0).  Electronically Signed   By: Ulyses Jarred M.D.   On: 03/22/2021 22:59   MR ABDOMEN MRCP W WO CONTAST  Result Date: 03/23/2021 CLINICAL DATA:  Jaundice, distended gallbladder on prior CT recent fall and trauma EXAM: MRI ABDOMEN WITHOUT AND WITH CONTRAST (INCLUDING MRCP) TECHNIQUE: Multiplanar multisequence MR imaging of the abdomen was performed both before and after the administration of intravenous contrast. Heavily T2-weighted images of the biliary and pancreatic ducts were obtained, and three-dimensional MRCP images were rendered by post processing. CONTRAST:  56m GADAVIST GADOBUTROL 1 MMOL/ML IV SOLN COMPARISON:  CT chest abdomen pelvis, 03/22/2021 FINDINGS: Lower chest: No acute findings. Hepatobiliary: No mass or other parenchymal abnormality identified. Severely distended gallbladder. No calculi identified. There is minimal gallbladder wall thickening and inflammatory fat stranding in the adjacent gallbladder fossa (series 10, image 23). There is severe intra and extrahepatic biliary ductal dilatation to the ampulla, the common bile duct measuring up to 1.6 cm in caliber. There is a relatively abrupt truncation of the common bile duct near the ampulla (series 16, image 33). Pancreas: The pancreatic parenchyma is mildly atrophic without discrete mass or other abnormality identified. The pancreatic duct is nondilated. Spleen:  Within normal limits in size and appearance. Adrenals/Urinary Tract: No masses identified. No evidence of hydronephrosis. Stomach/Bowel: Visualized portions within the abdomen are unremarkable. Vascular/Lymphatic: No pathologically enlarged lymph nodes identified. No abdominal aortic aneurysm demonstrated. Aortic atherosclerosis. Other:  None. Musculoskeletal: No suspicious bone lesions identified. IMPRESSION: 1. Severely distended gallbladder with minimal gallbladder wall thickening and inflammatory fat stranding in the adjacent gallbladder fossa. No calculi identified. Findings are  concerning for acute cholecystitis. 2. There  is severe intra and extrahepatic biliary ductal dilatation to the ampulla, the common bile duct measuring up to 1.6 cm in caliber. There is a relatively abrupt truncation of the common bile duct near the ampulla however no discrete mass or calculus is appreciated. ERCP may be helpful to evaluate for subtle ampullary mass. Electronically Signed   By: Eddie Candle M.D.   On: 03/23/2021 10:56   CT CHEST ABDOMEN PELVIS WO CONTRAST  Result Date: 03/22/2021 CLINICAL DATA:  [place] and hit left chest EXAM: CT CHEST, ABDOMEN AND PELVIS WITHOUT CONTRAST TECHNIQUE: Multidetector CT imaging of the chest, abdomen and pelvis was performed following the standard protocol without IV contrast. COMPARISON:  None. FINDINGS: CT CHEST FINDINGS Cardiovascular: Limited evaluation without intravenous contrast. Advanced aortic atherosclerosis. No aneurysm. Coronary vascular calcification. Normal cardiac size. No pericardial effusion Mediastinum/Nodes: Midline trachea. Suspicion of 1.8 cm right thyroid nodule. No suspicious nodes. Esophagus within normal limits Lungs/Pleura: No acute consolidation, pleural effusion, or pneumothorax. Musculoskeletal: Sternum is intact. Severe compression fracture of T5. Acute nondisplaced left eleventh posterior rib fracture. CT ABDOMEN PELVIS FINDINGS Hepatobiliary: No focal hepatic abnormality. Markedly distended gallbladder with mild surrounding edema. No definite calcified stone. Intra and extrahepatic biliary dilatation, extrahepatic common bile duct dilated up to 15 mm. Pancreas: No inflammatory changes.  No ductal dilatation. Spleen: Normal in size without focal abnormality. Adrenals/Urinary Tract: Adrenal glands are unremarkable. Kidneys are normal, without renal calculi, focal lesion, or hydronephrosis. Bladder is unremarkable. Stomach/Bowel: Stomach is nonenlarged. No dilated small bowel. Fluid-filled bowel without obstructive pattern. No acute bowel  wall thickening Vascular/Lymphatic: Advanced aortic atherosclerosis without aneurysm. No suspicious nodes. Reproductive: Large calcified left uterine fibroid.  No adnexal mass Other: Negative for free air or free fluid. Musculoskeletal: No evidence for pelvic fracture. Orthopedic hardware in the right femur. Grade 1 anterolisthesis L4 on L5. IMPRESSION: 1. No CT evidence for acute intrathoracic abnormality. Severe compression fracture T5, see separately dictated CT thoracic and lumbar reports. Acute nondisplaced left eleventh posterior rib fracture. 2. Markedly distended gallbladder with surrounding soft tissue stranding, raising possibility of cholecystitis. Recommend correlation with ultrasound. There is moderate to marked intra and extrahepatic biliary dilatation, suspicious for ductal obstruction, this may be evaluated with MRCP. 3. Large calcified uterine fibroid 4. Suspected 1.8 cm right thyroid nodule. Recommend thyroid US (ref: J Am Coll Radiol. 2015 Feb;12(2): 143-50). This may be performed non emergently. Electronically Signed   By: Donavan Foil M.D.   On: 03/22/2021 23:32    ASSESMENT:   *     jaundice, obstructive.  Markedly elevated T bili, alk phos and transaminases. INR normal at 1.  Severe intra and extrahepatic biliary ductal dilatation with abrupt truncation at ampulla but no choledocholithiasis or visible mass.  *    ? Cholecystitis.  General surgery does not feel this is cholecystitis or cholangitis..  No fevers, no leukocytosis, no abdominal pain.  GI suspects the fluid is reactive  *    hyponatremia.   *    hypokalemia.  Potassium 3.4.  *    recent fall.  CT confirms left rib fracture, T5 compression fracture   PLAN   *   ERCP today.  CA 19-9 ordered.    Azucena Freed  03/24/2021, 10:51 AM Phone [phone]  GI ATTENDING  Interval history data reviewed.  Patient seen and examined in the endoscopy suite.  Now for ERCP.The nature of the procedure, as well as the risks,  benefits, and alternatives were carefully and thoroughly reviewed with  the patient. Ample time for discussion and questions allowed. The patient understood, was satisfied, and agreed to proceed.  Docia Chuck. Geri Seminole., M.D. [facility] Division of Gastroenterology

## 2021-03-24 NOTE — Anesthesia Preprocedure Evaluation (Signed)
Anesthesia Evaluation  Patient identified by MRN, date of birth, ID band Patient awake    Reviewed: Allergy & Precautions, NPO status , Patient's Chart, lab work & pertinent test results  Airway Mallampati: II   Neck ROM: Full    Dental  (+) Teeth Intact, Dental Advisory Given   Pulmonary Current Smoker and Patient abstained from smoking.,    breath sounds clear to auscultation       Cardiovascular  Rhythm:Regular Rate:Normal     Neuro/Psych    GI/Hepatic   Endo/Other    Renal/GU      Musculoskeletal   Abdominal   Peds  Hematology   Anesthesia Other Findings Marked jaundice  Reproductive/Obstetrics                             Anesthesia Physical Anesthesia Plan  ASA: III  Anesthesia Plan: General   Post-op Pain Management:    Induction: Intravenous  PONV Risk Score and Plan: Ondansetron and Dexamethasone  Airway Management Planned: Oral ETT  Additional Equipment:   Intra-op Plan:   Post-operative Plan: Extubation in OR  Informed Consent: I have reviewed the patients History and Physical, chart, labs and discussed the procedure including the risks, benefits and alternatives for the proposed anesthesia with the patient or authorized representative who has indicated his/her understanding and acceptance.     Dental advisory given  Plan Discussed with: CRNA and Anesthesiologist  Anesthesia Plan Comments:         Anesthesia Quick Evaluation

## 2021-03-24 NOTE — Anesthesia Postprocedure Evaluation (Signed)
Anesthesia Post Note  Patient: PHEONIX WISBY  Procedure(s) Performed: ENDOSCOPIC RETROGRADE CHOLANGIOPANCREATOGRAPHY (ERCP) WITH PROPOFOL (N/A ) BILIARY BRUSHING BILIARY STENT PLACEMENT SPHINCTEROTOMY BIOPSY     Patient location during evaluation: Endoscopy Anesthesia Type: General Level of consciousness: awake and alert Pain management: pain level controlled Vital Signs Assessment: post-procedure vital signs reviewed and stable Respiratory status: spontaneous breathing, nonlabored ventilation, respiratory function stable and patient connected to nasal cannula oxygen Cardiovascular status: blood pressure returned to baseline and stable Postop Assessment: no apparent nausea or vomiting Anesthetic complications: no   No complications documented.  Last Vitals:  Vitals:   03/24/21 1616 03/24/21 1630  BP: (!) 167/91 (!) 166/98  Pulse: 84 79  Resp: 20 19  Temp:    SpO2: (!) 82% 93%    Last Pain:  Vitals:   03/24/21 1630  TempSrc:   PainSc: 0-No pain                 Jay Haskew COKER

## 2021-03-24 NOTE — Transfer of Care (Signed)
Immediate Anesthesia Transfer of Care Note  Patient: Alicia Spencer  Procedure(s) Performed: ENDOSCOPIC RETROGRADE CHOLANGIOPANCREATOGRAPHY (ERCP) WITH PROPOFOL (N/A ) BILIARY BRUSHING BILIARY STENT PLACEMENT SPHINCTEROTOMY BIOPSY  Patient Location: PACU  Anesthesia Type:General  Level of Consciousness: awake  Airway & Oxygen Therapy: Patient Spontanous Breathing  Post-op Assessment: Report given to RN and Post -op Vital signs reviewed and stable  Post vital signs: stable  Last Vitals:  Vitals Value Taken Time  BP 174/88 03/24/21 1623  Temp    Pulse 80 03/24/21 1626  Resp 25 03/24/21 1626  SpO2 93 % 03/24/21 1626  Vitals shown include unvalidated device data.  Last Pain:  Vitals:   03/24/21 1616  TempSrc:   PainSc: 0-No pain      Patients Stated Pain Goal: 0 (03/24/21 1348)  Complications: No complications documented.

## 2021-03-24 NOTE — Progress Notes (Signed)
Progress Note     Subjective: Patient denies abdominal pain this AM. Denies nausea or vomiting. Having bowel function. Sitter at bedside. She asks repeatedly about going home.   Objective: Vital signs in last 24 hours: Temp:  [97.8 F (36.6 C)-98.9 F (37.2 C)] 97.8 F (36.6 C) (06/03 0715) Pulse Rate:  [75-90] 75 (06/03 0715) Resp:  [16-22] 22 (06/03 0715) BP: (120-157)/(67-81) 157/79 (06/03 0715) SpO2:  [94 %-96 %] 96 % (06/03 0715) Last BM Date: 03/22/21  Intake/Output from previous day: 06/02 0701 - 06/03 0700 In: 1557.7 [I.V.:1500; IV Piggyback:57.7] Out: -  Intake/Output this shift: No intake/output data recorded.  PE: General: pleasant, WD, chronically ill appearing female who is laying in bed in NAD HEENT: Sclera are icteric.   Heart: regular, rate, and rhythm.  Lungs: Respiratory effort nonlabored Abd: soft, NT, ND, +BS, no masses, hernias, or organomegaly MS: all 4 extremities are symmetrical with no cyanosis, clubbing, or edema. Skin: warm and dry, jaundiced Psych: A&Ox4 but seems confused.   Lab Results:  Recent Labs    03/23/21 0300 03/24/21 0649  WBC 7.8 6.7  HGB 11.5* 11.8*  HCT 32.3* 31.5*  PLT 557* 545*   BMET Recent Labs    03/23/21 0300 03/24/21 0649  NA 133* 137  K 3.6 3.4*  CL 96* 104  CO2 24 23  GLUCOSE 105* 95  BUN 13 11  CREATININE 0.66 0.65  CALCIUM 9.0 8.8*   PT/INR Recent Labs    03/23/21 0930  LABPROT 13.6  INR 1.0   CMP     Component Value Date/Time   NA 137 03/24/2021 0649   K 3.4 (L) 03/24/2021 0649   CL 104 03/24/2021 0649   CO2 23 03/24/2021 0649   GLUCOSE 95 03/24/2021 0649   BUN 11 03/24/2021 0649   CREATININE 0.65 03/24/2021 0649   CALCIUM 8.8 (L) 03/24/2021 0649   PROT 6.0 (L) 03/24/2021 0649   ALBUMIN 2.7 (L) 03/24/2021 0649   AST 517 (H) 03/24/2021 0649   ALT 378 (H) 03/24/2021 0649   ALKPHOS 1,010 (H) 03/24/2021 0649   BILITOT 16.1 (H) 03/24/2021 0649   GFRNONAA >60 03/24/2021 0649    Lipase     Component Value Date/Time   LIPASE 27 03/22/2021 1602       Studies/Results: DG Ribs Unilateral W/Chest Left  Result Date: 03/22/2021 CLINICAL DATA:  80 year old female with fall. EXAM: LEFT RIBS AND CHEST - 3+ VIEW COMPARISON:  None. FINDINGS: No focal consolidation, pleural effusion, or pneumothorax. The cardiac silhouette is within limits. Atherosclerotic calcification of the aorta. Osteopenia with degenerative changes of the spine. Evaluation for rib fracture is very limited due to advanced osteopenia. No obvious or displaced rib fracture identified. IMPRESSION: Negative. Electronically Signed   By: Anner Crete M.D.   On: 03/22/2021 16:30   CT Head Wo Contrast  Result Date: 03/22/2021 CLINICAL DATA:  Fall EXAM: CT HEAD WITHOUT CONTRAST TECHNIQUE: Contiguous axial images were obtained from the base of the skull through the vertex without intravenous contrast. COMPARISON:  None. FINDINGS: Brain: There is atrophy and chronic small vessel disease changes. No acute intracranial abnormality. Specifically, no hemorrhage, hydrocephalus, mass lesion, acute infarction, or significant intracranial injury. Vascular: No hyperdense vessel or unexpected calcification. Skull: No acute calvarial abnormality. Sinuses/Orbits: Visualized paranasal sinuses and mastoids clear. Orbital soft tissues unremarkable. Other: None IMPRESSION: Atrophy, chronic microvascular disease. No acute intracranial abnormality. Electronically Signed   By: Rolm Baptise M.D.   On: 03/22/2021 22:50  CT Cervical Spine Wo Contrast  Result Date: 03/22/2021 CLINICAL DATA:  Fall EXAM: CT CERVICAL SPINE WITHOUT CONTRAST TECHNIQUE: Multidetector CT imaging of the cervical spine was performed without intravenous contrast. Multiplanar CT image reconstructions were also generated. COMPARISON:  None. FINDINGS: Alignment: No subluxation. Skull base and vertebrae: No acute fracture. No primary bone lesion or focal pathologic  process. Soft tissues and spinal canal: No prevertebral fluid or swelling. No visible canal hematoma. Disc levels: Degenerative disc disease most pronounced at C5-6 and C6-7. Bilateral degenerative facet disease, left greater than right. Upper chest: No acute findings Other: Nodule replaces much of the right thyroid lobe measuring up to 2 cm. Smaller nodule in the left thyroid lobe measuring 9 mm. IMPRESSION: Degenerative disc and facet disease.  No acute bony abnormality. Bilateral thyroid nodules measuring up 2 cm in the right thyroid lobe. Recommend non emergent thyroid US (ref: J Am Coll Radiol. 2015 Feb;12(2): 143-50). Electronically Signed   By: Rolm Baptise M.D.   On: 03/22/2021 22:53   MR 3D Recon At Scanner  Result Date: 03/23/2021 CLINICAL DATA:  Jaundice, distended gallbladder on prior CT recent fall and trauma EXAM: MRI ABDOMEN WITHOUT AND WITH CONTRAST (INCLUDING MRCP) TECHNIQUE: Multiplanar multisequence MR imaging of the abdomen was performed both before and after the administration of intravenous contrast. Heavily T2-weighted images of the biliary and pancreatic ducts were obtained, and three-dimensional MRCP images were rendered by post processing. CONTRAST:  65mL GADAVIST GADOBUTROL 1 MMOL/ML IV SOLN COMPARISON:  CT chest abdomen pelvis, 03/22/2021 FINDINGS: Lower chest: No acute findings. Hepatobiliary: No mass or other parenchymal abnormality identified. Severely distended gallbladder. No calculi identified. There is minimal gallbladder wall thickening and inflammatory fat stranding in the adjacent gallbladder fossa (series 10, image 23). There is severe intra and extrahepatic biliary ductal dilatation to the ampulla, the common bile duct measuring up to 1.6 cm in caliber. There is a relatively abrupt truncation of the common bile duct near the ampulla (series 16, image 33). Pancreas: The pancreatic parenchyma is mildly atrophic without discrete mass or other abnormality identified. The  pancreatic duct is nondilated. Spleen:  Within normal limits in size and appearance. Adrenals/Urinary Tract: No masses identified. No evidence of hydronephrosis. Stomach/Bowel: Visualized portions within the abdomen are unremarkable. Vascular/Lymphatic: No pathologically enlarged lymph nodes identified. No abdominal aortic aneurysm demonstrated. Aortic atherosclerosis. Other:  None. Musculoskeletal: No suspicious bone lesions identified. IMPRESSION: 1. Severely distended gallbladder with minimal gallbladder wall thickening and inflammatory fat stranding in the adjacent gallbladder fossa. No calculi identified. Findings are concerning for acute cholecystitis. 2. There is severe intra and extrahepatic biliary ductal dilatation to the ampulla, the common bile duct measuring up to 1.6 cm in caliber. There is a relatively abrupt truncation of the common bile duct near the ampulla however no discrete mass or calculus is appreciated. ERCP may be helpful to evaluate for subtle ampullary mass. Electronically Signed   By: Eddie Candle M.D.   On: 03/23/2021 10:56   CT T-SPINE NO CHARGE  Result Date: 03/22/2021 CLINICAL DATA:  Fall EXAM: CT Thoracic and Lumbar spine without contrast TECHNIQUE: Multiplanar CT images of the thoracic and lumbar spine were reconstructed from contemporary CT of the Chest, Abdomen, and Pelvis CONTRAST:  No additional contrast COMPARISON:  None FINDINGS: CT THORACIC SPINE FINDINGS Alignment: Normal. Vertebrae: Burst type fracture of T5 with approximately 50% height loss. 2 mm retropulsion. No extension to the posterior elements. Paraspinal and other soft tissues: Calcific aortic atherosclerosis. Disc levels: No spinal canal  stenosis. CT LUMBAR SPINE FINDINGS Segmentation: 5 lumbar type vertebrae. Alignment: Grade 1 anterolisthesis at L4-5 Vertebrae: No acute fracture or focal pathologic process. Paraspinal and other soft tissues: Calcific aortic atherosclerosis. Markedly distended gallbladder. Disc  levels: Mild spinal canal stenosis at L4-5 secondary to anterolisthesis caused by facet arthrosis. No neural impingement. Moderate right L5-S1 neural foraminal stenosis. IMPRESSION: 1. Burst type fracture of T5 with approximately 50% height loss and 2 mm retropulsion. No extension to the posterior elements. 2. No acute fracture of the lumbar spine. 3. Mild spinal canal stenosis at L4-5 secondary to anterolisthesis and facet arthrosis. 4. Moderate right L5-S1 neural foraminal stenosis. 5. Markedly distended gallbladder. Aortic Atherosclerosis (ICD10-I70.0). Electronically Signed   By: Ulyses Jarred M.D.   On: 03/22/2021 22:59   CT L-SPINE NO CHARGE  Result Date: 03/22/2021 CLINICAL DATA:  Fall EXAM: CT Thoracic and Lumbar spine without contrast TECHNIQUE: Multiplanar CT images of the thoracic and lumbar spine were reconstructed from contemporary CT of the Chest, Abdomen, and Pelvis CONTRAST:  No additional contrast COMPARISON:  None FINDINGS: CT THORACIC SPINE FINDINGS Alignment: Normal. Vertebrae: Burst type fracture of T5 with approximately 50% height loss. 2 mm retropulsion. No extension to the posterior elements. Paraspinal and other soft tissues: Calcific aortic atherosclerosis. Disc levels: No spinal canal stenosis. CT LUMBAR SPINE FINDINGS Segmentation: 5 lumbar type vertebrae. Alignment: Grade 1 anterolisthesis at L4-5 Vertebrae: No acute fracture or focal pathologic process. Paraspinal and other soft tissues: Calcific aortic atherosclerosis. Markedly distended gallbladder. Disc levels: Mild spinal canal stenosis at L4-5 secondary to anterolisthesis caused by facet arthrosis. No neural impingement. Moderate right L5-S1 neural foraminal stenosis. IMPRESSION: 1. Burst type fracture of T5 with approximately 50% height loss and 2 mm retropulsion. No extension to the posterior elements. 2. No acute fracture of the lumbar spine. 3. Mild spinal canal stenosis at L4-5 secondary to anterolisthesis and facet  arthrosis. 4. Moderate right L5-S1 neural foraminal stenosis. 5. Markedly distended gallbladder. Aortic Atherosclerosis (ICD10-I70.0). Electronically Signed   By: Ulyses Jarred M.D.   On: 03/22/2021 22:59   MR ABDOMEN MRCP W WO CONTAST  Result Date: 03/23/2021 CLINICAL DATA:  Jaundice, distended gallbladder on prior CT recent fall and trauma EXAM: MRI ABDOMEN WITHOUT AND WITH CONTRAST (INCLUDING MRCP) TECHNIQUE: Multiplanar multisequence MR imaging of the abdomen was performed both before and after the administration of intravenous contrast. Heavily T2-weighted images of the biliary and pancreatic ducts were obtained, and three-dimensional MRCP images were rendered by post processing. CONTRAST:  23mL GADAVIST GADOBUTROL 1 MMOL/ML IV SOLN COMPARISON:  CT chest abdomen pelvis, 03/22/2021 FINDINGS: Lower chest: No acute findings. Hepatobiliary: No mass or other parenchymal abnormality identified. Severely distended gallbladder. No calculi identified. There is minimal gallbladder wall thickening and inflammatory fat stranding in the adjacent gallbladder fossa (series 10, image 23). There is severe intra and extrahepatic biliary ductal dilatation to the ampulla, the common bile duct measuring up to 1.6 cm in caliber. There is a relatively abrupt truncation of the common bile duct near the ampulla (series 16, image 33). Pancreas: The pancreatic parenchyma is mildly atrophic without discrete mass or other abnormality identified. The pancreatic duct is nondilated. Spleen:  Within normal limits in size and appearance. Adrenals/Urinary Tract: No masses identified. No evidence of hydronephrosis. Stomach/Bowel: Visualized portions within the abdomen are unremarkable. Vascular/Lymphatic: No pathologically enlarged lymph nodes identified. No abdominal aortic aneurysm demonstrated. Aortic atherosclerosis. Other:  None. Musculoskeletal: No suspicious bone lesions identified. IMPRESSION: 1. Severely distended gallbladder with  minimal gallbladder wall  thickening and inflammatory fat stranding in the adjacent gallbladder fossa. No calculi identified. Findings are concerning for acute cholecystitis. 2. There is severe intra and extrahepatic biliary ductal dilatation to the ampulla, the common bile duct measuring up to 1.6 cm in caliber. There is a relatively abrupt truncation of the common bile duct near the ampulla however no discrete mass or calculus is appreciated. ERCP may be helpful to evaluate for subtle ampullary mass. Electronically Signed   By: Eddie Candle M.D.   On: 03/23/2021 10:56   CT CHEST ABDOMEN PELVIS WO CONTRAST  Result Date: 03/22/2021 CLINICAL DATA:  [place] and hit left chest EXAM: CT CHEST, ABDOMEN AND PELVIS WITHOUT CONTRAST TECHNIQUE: Multidetector CT imaging of the chest, abdomen and pelvis was performed following the standard protocol without IV contrast. COMPARISON:  None. FINDINGS: CT CHEST FINDINGS Cardiovascular: Limited evaluation without intravenous contrast. Advanced aortic atherosclerosis. No aneurysm. Coronary vascular calcification. Normal cardiac size. No pericardial effusion Mediastinum/Nodes: Midline trachea. Suspicion of 1.8 cm right thyroid nodule. No suspicious nodes. Esophagus within normal limits Lungs/Pleura: No acute consolidation, pleural effusion, or pneumothorax. Musculoskeletal: Sternum is intact. Severe compression fracture of T5. Acute nondisplaced left eleventh posterior rib fracture. CT ABDOMEN PELVIS FINDINGS Hepatobiliary: No focal hepatic abnormality. Markedly distended gallbladder with mild surrounding edema. No definite calcified stone. Intra and extrahepatic biliary dilatation, extrahepatic common bile duct dilated up to 15 mm. Pancreas: No inflammatory changes.  No ductal dilatation. Spleen: Normal in size without focal abnormality. Adrenals/Urinary Tract: Adrenal glands are unremarkable. Kidneys are normal, without renal calculi, focal lesion, or hydronephrosis. Bladder is  unremarkable. Stomach/Bowel: Stomach is nonenlarged. No dilated small bowel. Fluid-filled bowel without obstructive pattern. No acute bowel wall thickening Vascular/Lymphatic: Advanced aortic atherosclerosis without aneurysm. No suspicious nodes. Reproductive: Large calcified left uterine fibroid.  No adnexal mass Other: Negative for free air or free fluid. Musculoskeletal: No evidence for pelvic fracture. Orthopedic hardware in the right femur. Grade 1 anterolisthesis L4 on L5. IMPRESSION: 1. No CT evidence for acute intrathoracic abnormality. Severe compression fracture T5, see separately dictated CT thoracic and lumbar reports. Acute nondisplaced left eleventh posterior rib fracture. 2. Markedly distended gallbladder with surrounding soft tissue stranding, raising possibility of cholecystitis. Recommend correlation with ultrasound. There is moderate to marked intra and extrahepatic biliary dilatation, suspicious for ductal obstruction, this may be evaluated with MRCP. 3. Large calcified uterine fibroid 4. Suspected 1.8 cm right thyroid nodule. Recommend thyroid US (ref: J Am Coll Radiol. 2015 Feb;12(2): 143-50). This may be performed non emergently. Electronically Signed   By: Donavan Foil M.D.   On: 03/22/2021 23:32    Anti-infectives: Anti-infectives (From admission, onward)   Start     Dose/Rate Route Frequency Ordered Stop   03/23/21 0200  piperacillin-tazobactam (ZOSYN) IVPB 3.375 g  Status:  Discontinued        3.375 g 12.5 mL/hr over 240 Minutes Intravenous Every 8 hours 03/23/21 0159 03/23/21 1404       Assessment/Plan Abdominal pain  Hyperammonemia  Hyperbilirubinemia with elevated transaminases - Tbili 16.1 this AM and Alk Phos 1010, AST/ALT 517/378 - hepatitis panel is negative - no leukocytosis and afebrile - I think pericholecystic fluid seen on CT is likely just reactive. She does not appear to have cholecystitis or cholangitis  - MRCP with severe intra and extrahepatic biliary  dilatation with narrowing of CBD near ampulla - patient does not needs any further antibiotics from a surgical standpoint  - GI following and planning ERCP today  - we will continue  to follow   FEN: NPO, IVF VTE: SCDs, lovenox ID: Zosyn 6/2  - below per primary attending -  Tobacco use disorder R thyroid nodule 1.8 cm, incidentally found  T5 compression fracture L 11th rib fracture  S/P recent fall   LOS: 2 days    Norm Parcel, [facility] 03/24/2021, 10:18 AM Please see Amion for pager number during day hours 7:00am-4:30pm

## 2021-03-24 NOTE — Op Note (Signed)
[facility] Patient Name: Alicia Spencer Procedure Date : 03/24/2021 MRN: [id number] Attending MD: Wilhemina Bonito. Marina Goodell , MD Date of Birth: [birth year] CSN: [id number] Age: 80 Admit Type: Inpatient Procedure:                ERCP with sphincterotomy, brushings, biopsies,                            biliary stent placement Indications:              Biliary dilation on Computed Tomogram Scan and                            MRCP. No obvious pancreatic mass. Normal pancreatic                            duct caliber. Obstructive jaundice Providers:                Wilhemina Bonito. Marina Goodell, MD, Rozetta Nunnery, Technician,                            Dayton Bailiff, RN Referring MD:             [facility] hospitalist Medicines:                General Anesthesia Complications:            No immediate complications. Estimated Blood Loss:     Estimated blood loss: none. Procedure:                Pre-Anesthesia Assessment:                           - Prior to the procedure, a History and Physical                            was performed, and patient medications and                            allergies were reviewed. The patient is competent.                            The risks and benefits of the procedure and the                            sedation options and risks were discussed with the                            patient. All questions were answered and informed                            consent was obtained. Patient identification and                            proposed procedure were verified by the physician.  Mental Status Examination: alert and oriented.                            Airway Examination: normal oropharyngeal airway and                            neck mobility. Respiratory Examination: clear to                            auscultation. CV Examination: normal. Prophylactic                            Antibiotics: The patient does not require                             prophylactic antibiotics. Prior Anticoagulants: The                            patient has taken no previous anticoagulant or                            antiplatelet agents. ASA Grade Assessment: II - A                            patient with mild systemic disease. After reviewing                            the risks and benefits, the patient was deemed in                            satisfactory condition to undergo the procedure.                            The anesthesia plan was to use moderate sedation /                            analgesia (conscious sedation). Immediately prior                            to administration of medications, the patient was                            re-assessed for adequacy to receive sedatives. The                            heart rate, respiratory rate, oxygen saturations,                            blood pressure, adequacy of pulmonary ventilation,                            and response to care were monitored throughout the  procedure. The physical status of the patient was                            re-assessed after the procedure.                           After obtaining informed consent, the scope was                            passed under direct vision. Throughout the                            procedure, the patient's blood pressure, pulse, and                            oxygen saturations were monitored continuously. The                            TJF- Q180V ([id number]) Olympus duodenoscope was                            introduced through the mouth, and used to inject                            contrast into and used to inject contrast into the                            bile duct. The ERCP was accomplished without                            difficulty. The patient tolerated the procedure                            well. Scope In: Scope Out: Findings:      1. The esophagus was not examined. The stomach was  unremarkable.      2. There were multiple ulcers in the duodenal bulb and second portion of       the duodenum. Several with black eschar      3. 1 large ulcer involve the major papilla. This was biopsied.      4. The common bile duct was cannulated with an 035 hydrophilic wire.       Injection of contrast yielded a markedly dilated biliary tree       throughout. There was abrupt cut off of the most distal portion of the       bile duct measuring approximately 12 mm. This was felt possibly to       represent a distal biliary stricture. This was brushed several times       with a cytology brush and submitted to pathology.      5. A moderate to large biliary sphincterotomy was made by cutting over       the guidewire in the 12:00 orientation using the ERBE system.      6. A 10 French 5 cm in length biliary stent with proximal and distal       flaps was placed into the bile  duct in good position with excellent       drainage      7. There was no manipulation or injection of the pancreatic duct Impression:               1. Ulcerative changes in the duodenal bulb and                            second duodenum including a large ulcer involving                            the major papilla. Status post biopsies                           2. Abrupt cut off of the distal bile duct. 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 Cannot rule out                            edema from ulceration at the ampulla. Status post                            brushings                           3. Status post enterotomy with biliary stent                            placement. Recommendation:           1. Standard post ERCP observation and care                           2. Follow-up cytology and biopsies                           3. Trend liver tests Procedure Code(s):        --- Professional ---                           [phone], Endoscopic retrograde                             cholangiopancreatography (ERCP); with placement of                            endoscopic stent into biliary or pancreatic duct,                            including pre- and post-dilation and guide wire                            passage, when performed, including sphincterotomy,                            when performed, each stent Diagnosis Code(s):        ---  Professional ---                           K83.8, Other specified diseases of biliary tract CPT copyright 2019 American [employer]. All rights reserved. The codes documented in this report are preliminary and upon coder review may  be revised to meet current compliance requirements. [name], MD 03/24/2021 4:18:55 PM This report has been signed electronically. Number of Addenda: 0

## 2021-03-24 NOTE — Anesthesia Procedure Notes (Signed)
Procedure Name: Intubation Date/Time: 03/24/2021 3:04 PM Performed by: Stanton Kidney, CRNA Pre-anesthesia Checklist: Patient identified, Patient being monitored, Timeout performed, Emergency Drugs available and Suction available Patient Re-evaluated:Patient Re-evaluated prior to induction Oxygen Delivery Method: Circle system utilized Preoxygenation: Pre-oxygenation with 100% oxygen Induction Type: IV induction Ventilation: Mask ventilation without difficulty Laryngoscope Size: Miller and 2 Grade View: Grade I Tube type: Oral Tube size: 7.0 mm Number of attempts: 1 Airway Equipment and Method: Stylet Placement Confirmation: ETT inserted through vocal cords under direct vision,  positive ETCO2 and breath sounds checked- equal and bilateral Secured at: 21 cm Tube secured with: Tape Dental Injury: Teeth and Oropharynx as per pre-operative assessment

## 2021-03-24 NOTE — Plan of Care (Signed)

## 2021-03-24 NOTE — Progress Notes (Signed)
PROGRESS NOTE   Alicia Spencer  QIH:[id number]    DOB: [birth year]    DOA: 03/22/2021  PCP: [name], MD   I have briefly reviewed patients previous medical records in [facility].  Chief Complaint  Patient presents with  . Jaundice    Brief Narrative:   80 year old female with history of tobacco use disorder, no other known significant past medical history, not on prescription meds, presented to ED due to worsening epigastric and back pain of 1 week duration and nausea, vomiting and jaundice of 1 day duration.  History of mechanical fall approximately 2 weeks PTA, leading to mid back pain for which she had been taking OTC meds.  Admitted with diagnosis of possible obstructive jaundice, transaminitis and concern for acute cholecystitis.  [facility] evaluated and plan ERCP on 6/3.  General surgery consulted.   Assessment & Plan:  Active Problems:   Acute cholecystitis   Abdominal pain with obstructive jaundice, acute transaminitis, concern for acute cholecystitis: Admitting labs significant for alkaline phosphatase 1068, AST 578, ALT 458, total bilirubin 16.4.  Lipase normal.  INR 1.  Serum acetaminophen, salicylate level and blood alcohol levels negative.  Acute hepatitis panel negative.  CT thoracic spine 6/1: Markedly distended gallbladder.  CT chest abdomen and pelvis 6/1: Markedly distended gallbladder with surrounding soft tissue stranding, raising possibility of cholecystitis.  There is moderate to marked intra and extrahepatic biliary dilatation, suspicious for ductal obstruction.  MRCP 6/2: Severely distended gallbladder with minimal gallbladder wall thickening and inflammatory fat stranding in the adjacent gallbladder fossa.  No calculi identified.  Findings are concerning for acute cholecystitis.  Severe intra and extrahepatic biliary ductal dilatation to the ampulla, CBD measuring up to 1.6 cm in caliber.  Relatively abrupt truncation of the CBD near the ampulla however no  discrete mass or calculus appreciated.  GI input appreciated and plan ERCP 6/3.  General surgery input appreciated and do not feel that she has cholecystitis or cholangitis, have canceled HIDA scan and recommend stopping antibiotics.  Continue IV fluids.  Trend daily LFTs.  No substantial change in LFTs.  Concern for neoplasm/biliary stricture etc.  S/p recent mechanical fall/T5 compression fracture/acute nondisplaced left 11th posterior rib fracture: CT thoracic spine: 6/1 showed burst type fracture of T5 with approximately 50% height loss and 2 mm retropulsion.  No extension to the posterior elements.  Markedly distended gallbladder.  CT chest abdomen and pelvis 6/1: Severe compression fracture T5 and acute nondisplaced left 11th posterior rib fracture.  Other extensive imaging (chest x-ray with ribs, CT C-spine, CT head, CT L-spine) without acute abnormalities.  Supportive treatment with pain management, PT and OT evaluation.    Hyperammonemia/agitated delirium complicating possible underlying dementia: Hyperammonemia secondary to obstructive jaundice.  Minimal and less likely to be hepatic encephalopathy. Baseline mental status not known.  It is possible that patient has underlying cognitive impairment/possible dementia and needs to be verified with family-unable to reach yesterday.  Delirium precautions.  Did get a dose of Haldol 6/2 afternoon.  Pleasantly confused without agitation today.  Safety sitter at bedside.  Dehydration with hyponatremia: Secondary to poor oral intake.  Resolved.  Tobacco use disorder: Cessation counseling when patient or family able to comprehend.  Continue nicotine patch.  Anemia: Could be dilutional.  Stable.  Thyroid nodules: Bilateral thyroid nodules measuring up to 2 cm in the right thyroid lobe noted on CT C-spine 6/1.  CT chest abdomen and pelvis 6/1: Suspected 1.8 cm right thyroid nodule.  Recommend  nonemergent thyroid ultrasound, which can be performed and  followed up as outpatient.  Hypokalemia: Replace and follow.  Body mass index is 23.44 kg/m.    DVT prophylaxis: Place and maintain sequential compression device Start: 03/23/21 1306 SCDs Start: 03/23/21 0047     Code Status: Full Code Family Communication: None at bedside.  I was unable to reach patient's spouse again on 6/3 via phone or even leave a VM message. Disposition:  Status is: Inpatient  Remains inpatient appropriate because:Inpatient level of care appropriate due to severity of illness   Dispo:  Patient From: Home  Planned Disposition: Home  Medically stable for discharge: No          Consultants:   [facility] General surgery  Procedures:   None  Antimicrobials:    Anti-infectives (From admission, onward)   Start     Dose/Rate Route Frequency Ordered Stop   03/23/21 0200  piperacillin-tazobactam (ZOSYN) IVPB 3.375 g  Status:  Discontinued        3.375 g 12.5 mL/hr over 240 Minutes Intravenous Every 8 hours 03/23/21 0159 03/23/21 1404        Subjective:  Pleasantly confused.  Oriented only to self.  States that she is in [place] and then that she is in a hotel.  Reports some epigastric intermittent pain.  Objective:   Vitals:   03/23/21 1521 03/23/21 1915 03/24/21 0715 03/24/21 0715  BP: 127/67 (!) 148/81 (!) 157/79 (!) 157/79  Pulse: 90 80 77 75  Resp: 18 16 (!) 22 (!) 22  Temp: 98.9 F (37.2 C) 98.1 F (36.7 C) 97.8 F (36.6 C) 97.8 F (36.6 C)  TempSrc: Axillary Oral Oral Oral  SpO2:  95% 96% 96%  Weight:      Height:        General exam: Elderly female, moderately built and frail lying sitting up comfortably in reclining chair.  Severe scleral icterus and skin icterus as well. Respiratory system: Clear to auscultation.  No increased work of breathing. Cardiovascular system: S1 and S2 heard, RRR.  No JVD, murmurs or pedal edema.  Telemetry personally reviewed: Sinus rhythm. Gastrointestinal system: Abdomen is nondistended,  soft and nontender.  No organomegaly or masses felt. Normal bowel sounds heard. Central nervous system: Alert and oriented only to self.  Rest of mental status as noted above. No focal neurological deficits. Extremities: Symmetric 5 x 5 power. Skin: No rashes, lesions or ulcers.  Right hip/thigh long vertical surgical scar. Psychiatry: Judgement and insight impaired. Mood & affect pleasantly confused, joking with the sitter.     Data Reviewed:   I have personally reviewed following labs and imaging studies   CBC: Recent Labs  Lab 03/22/21 1602 03/23/21 0300 03/24/21 0649  WBC 7.7 7.8 6.7  NEUTROABS 5.0  --   --   HGB 12.5 11.5* 11.8*  HCT 34.4* 32.3* 31.5*  MCV 90.1 90.0 87.5  PLT 567* 557* 545*    Basic Metabolic Panel: Recent Labs  Lab 03/22/21 1602 03/23/21 0300 03/24/21 0649  NA 132* 133* 137  K 3.5 3.6 3.4*  CL 96* 96* 104  CO2 25 24 23   GLUCOSE 116* 105* 95  BUN 15 13 11   CREATININE 0.88 0.66 0.65  CALCIUM 9.1 9.0 8.8*  MG  --  2.1  --   PHOS  --  4.6  --     Liver Function Tests: Recent Labs  Lab 03/22/21 1602 03/23/21 0300 03/24/21 0649  AST 578* 481* 517*  ALT 458*  396* 378*  ALKPHOS 1,068* 1,079* 1,010*  BILITOT 16.4* 15.8* 16.1*  PROT 6.7 6.8 6.0*  ALBUMIN 3.2* 3.1* 2.7*    CBG: No results for input(s): GLUCAP in the last 168 hours.  Microbiology Studies:   Recent Results (from the past 240 hour(s))  SARS CORONAVIRUS 2 (TAT 6-24 HRS) Nasopharyngeal Nasopharyngeal Swab     Status: None   Collection Time: 03/22/21 11:51 PM   Specimen: Nasopharyngeal Swab  Result Value Ref Range Status   SARS Coronavirus 2 NEGATIVE NEGATIVE Final    Comment: (NOTE) SARS-CoV-2 target nucleic acids are NOT DETECTED.  The SARS-CoV-2 RNA is generally detectable in upper and lower respiratory specimens during the acute phase of infection. Negative results do not preclude SARS-CoV-2 infection, do not rule out co-infections with other pathogens, and should  not be used as the sole basis for treatment or other patient management decisions. Negative results must be combined with clinical observations, patient history, and epidemiological information. The expected result is Negative.  Fact Sheet for Patients: HairSlick.no  Fact Sheet for Healthcare Providers: quierodirigir.com  This test is not yet approved or cleared by the [place] FDA and  has been authorized for detection and/or diagnosis of SARS-CoV-2 by FDA under an Emergency Use Authorization (EUA). This EUA will remain  in effect (meaning this test can be used) for the duration of the COVID-19 declaration under Se ction 564(b)(1) of the Act, 21 U.S.C. section 360bbb-3(b)(1), unless the authorization is terminated or revoked sooner.  Performed at [facility], [address], [place], [place] [address]   Culture, blood (routine x 2)     Status: None (Preliminary result)   Collection Time: 03/23/21  1:05 AM   Specimen: BLOOD  Result Value Ref Range Status   Specimen Description BLOOD LEFT ARM  Final   Special Requests   Final    BOTTLES DRAWN AEROBIC AND ANAEROBIC Blood Culture adequate volume   Culture   Final    NO GROWTH 1 DAY Performed at [facility], [address] [place], [place] [address]    Report Status PENDING  Incomplete  Culture, blood (routine x 2)     Status: None (Preliminary result)   Collection Time: 03/23/21  1:10 AM   Specimen: BLOOD  Result Value Ref Range Status   Specimen Description BLOOD RIGHT WRIST  Final   Special Requests   Final    BOTTLES DRAWN AEROBIC AND ANAEROBIC Blood Culture results may not be optimal due to an inadequate volume of blood received in culture bottles   Culture   Final    NO GROWTH 1 DAY Performed at [facility], [address], [place], [place] [address]    Report Status PENDING  Incomplete     Radiology Studies:  DG Ribs Unilateral  W/Chest Left  Result Date: 03/22/2021 CLINICAL DATA:  80 year old female with fall. EXAM: LEFT RIBS AND CHEST - 3+ VIEW COMPARISON:  None. FINDINGS: No focal consolidation, pleural effusion, or pneumothorax. The cardiac silhouette is within limits. Atherosclerotic calcification of the aorta. Osteopenia with degenerative changes of the spine. Evaluation for rib fracture is very limited due to advanced osteopenia. No obvious or displaced rib fracture identified. IMPRESSION: Negative. Electronically Signed   By: Elgie Collard M.D.   On: 03/22/2021 16:30   CT Head Wo Contrast  Result Date: 03/22/2021 CLINICAL DATA:  Fall EXAM: CT HEAD WITHOUT CONTRAST TECHNIQUE: Contiguous axial images were obtained from the base of the skull through  the vertex without intravenous contrast. COMPARISON:  None. FINDINGS: Brain: There is atrophy and chronic small vessel disease changes. No acute intracranial abnormality. Specifically, no hemorrhage, hydrocephalus, mass lesion, acute infarction, or significant intracranial injury. Vascular: No hyperdense vessel or unexpected calcification. Skull: No acute calvarial abnormality. Sinuses/Orbits: Visualized paranasal sinuses and mastoids clear. Orbital soft tissues unremarkable. Other: None IMPRESSION: Atrophy, chronic microvascular disease. No acute intracranial abnormality. Electronically Signed   By: Charlett Nose M.D.   On: 03/22/2021 22:50   CT Cervical Spine Wo Contrast  Result Date: 03/22/2021 CLINICAL DATA:  Fall EXAM: CT CERVICAL SPINE WITHOUT CONTRAST TECHNIQUE: Multidetector CT imaging of the cervical spine was performed without intravenous contrast. Multiplanar CT image reconstructions were also generated. COMPARISON:  None. FINDINGS: Alignment: No subluxation. Skull base and vertebrae: No acute fracture. No primary bone lesion or focal pathologic process. Soft tissues and spinal canal: No prevertebral fluid or swelling. No visible canal hematoma. Disc levels: Degenerative  disc disease most pronounced at C5-6 and C6-7. Bilateral degenerative facet disease, left greater than right. Upper chest: No acute findings Other: Nodule replaces much of the right thyroid lobe measuring up to 2 cm. Smaller nodule in the left thyroid lobe measuring 9 mm. IMPRESSION: Degenerative disc and facet disease.  No acute bony abnormality. Bilateral thyroid nodules measuring up 2 cm in the right thyroid lobe. Recommend non emergent thyroid US (ref: J Am Coll Radiol. 2015 Feb;12(2): 143-50). Electronically Signed   By: Charlett Nose M.D.   On: 03/22/2021 22:53   MR 3D Recon At Scanner  Result Date: 03/23/2021 CLINICAL DATA:  Jaundice, distended gallbladder on prior CT recent fall and trauma EXAM: MRI ABDOMEN WITHOUT AND WITH CONTRAST (INCLUDING MRCP) TECHNIQUE: Multiplanar multisequence MR imaging of the abdomen was performed both before and after the administration of intravenous contrast. Heavily T2-weighted images of the biliary and pancreatic ducts were obtained, and three-dimensional MRCP images were rendered by post processing. CONTRAST:  76mL GADAVIST GADOBUTROL 1 MMOL/ML IV SOLN COMPARISON:  CT chest abdomen pelvis, 03/22/2021 FINDINGS: Lower chest: No acute findings. Hepatobiliary: No mass or other parenchymal abnormality identified. Severely distended gallbladder. No calculi identified. There is minimal gallbladder wall thickening and inflammatory fat stranding in the adjacent gallbladder fossa (series 10, image 23). There is severe intra and extrahepatic biliary ductal dilatation to the ampulla, the common bile duct measuring up to 1.6 cm in caliber. There is a relatively abrupt truncation of the common bile duct near the ampulla (series 16, image 33). Pancreas: The pancreatic parenchyma is mildly atrophic without discrete mass or other abnormality identified. The pancreatic duct is nondilated. Spleen:  Within normal limits in size and appearance. Adrenals/Urinary Tract: No masses identified. No  evidence of hydronephrosis. Stomach/Bowel: Visualized portions within the abdomen are unremarkable. Vascular/Lymphatic: No pathologically enlarged lymph nodes identified. No abdominal aortic aneurysm demonstrated. Aortic atherosclerosis. Other:  None. Musculoskeletal: No suspicious bone lesions identified. IMPRESSION: 1. Severely distended gallbladder with minimal gallbladder wall thickening and inflammatory fat stranding in the adjacent gallbladder fossa. No calculi identified. Findings are concerning for acute cholecystitis. 2. There is severe intra and extrahepatic biliary ductal dilatation to the ampulla, the common bile duct measuring up to 1.6 cm in caliber. There is a relatively abrupt truncation of the common bile duct near the ampulla however no discrete mass or calculus is appreciated. ERCP may be helpful to evaluate for subtle ampullary mass. Electronically Signed   By: Lauralyn Primes M.D.   On: 03/23/2021 10:56   CT T-SPINE NO CHARGE  Result Date: 03/22/2021 CLINICAL DATA:  Fall EXAM: CT Thoracic and Lumbar spine without contrast TECHNIQUE: Multiplanar CT images of the thoracic and lumbar spine were reconstructed from contemporary CT of the Chest, Abdomen, and Pelvis CONTRAST:  No additional contrast COMPARISON:  None FINDINGS: CT THORACIC SPINE FINDINGS Alignment: Normal. Vertebrae: Burst type fracture of T5 with approximately 50% height loss. 2 mm retropulsion. No extension to the posterior elements. Paraspinal and other soft tissues: Calcific aortic atherosclerosis. Disc levels: No spinal canal stenosis. CT LUMBAR SPINE FINDINGS Segmentation: 5 lumbar type vertebrae. Alignment: Grade 1 anterolisthesis at L4-5 Vertebrae: No acute fracture or focal pathologic process. Paraspinal and other soft tissues: Calcific aortic atherosclerosis. Markedly distended gallbladder. Disc levels: Mild spinal canal stenosis at L4-5 secondary to anterolisthesis caused by facet arthrosis. No neural impingement. Moderate  right L5-S1 neural foraminal stenosis. IMPRESSION: 1. Burst type fracture of T5 with approximately 50% height loss and 2 mm retropulsion. No extension to the posterior elements. 2. No acute fracture of the lumbar spine. 3. Mild spinal canal stenosis at L4-5 secondary to anterolisthesis and facet arthrosis. 4. Moderate right L5-S1 neural foraminal stenosis. 5. Markedly distended gallbladder. Aortic Atherosclerosis (ICD10-I70.0). Electronically Signed   By: Deatra Robinson M.D.   On: 03/22/2021 22:59   CT L-SPINE NO CHARGE  Result Date: 03/22/2021 CLINICAL DATA:  Fall EXAM: CT Thoracic and Lumbar spine without contrast TECHNIQUE: Multiplanar CT images of the thoracic and lumbar spine were reconstructed from contemporary CT of the Chest, Abdomen, and Pelvis CONTRAST:  No additional contrast COMPARISON:  None FINDINGS: CT THORACIC SPINE FINDINGS Alignment: Normal. Vertebrae: Burst type fracture of T5 with approximately 50% height loss. 2 mm retropulsion. No extension to the posterior elements. Paraspinal and other soft tissues: Calcific aortic atherosclerosis. Disc levels: No spinal canal stenosis. CT LUMBAR SPINE FINDINGS Segmentation: 5 lumbar type vertebrae. Alignment: Grade 1 anterolisthesis at L4-5 Vertebrae: No acute fracture or focal pathologic process. Paraspinal and other soft tissues: Calcific aortic atherosclerosis. Markedly distended gallbladder. Disc levels: Mild spinal canal stenosis at L4-5 secondary to anterolisthesis caused by facet arthrosis. No neural impingement. Moderate right L5-S1 neural foraminal stenosis. IMPRESSION: 1. Burst type fracture of T5 with approximately 50% height loss and 2 mm retropulsion. No extension to the posterior elements. 2. No acute fracture of the lumbar spine. 3. Mild spinal canal stenosis at L4-5 secondary to anterolisthesis and facet arthrosis. 4. Moderate right L5-S1 neural foraminal stenosis. 5. Markedly distended gallbladder. Aortic Atherosclerosis (ICD10-I70.0).  Electronically Signed   By: Deatra Robinson M.D.   On: 03/22/2021 22:59   MR ABDOMEN MRCP W WO CONTAST  Result Date: 03/23/2021 CLINICAL DATA:  Jaundice, distended gallbladder on prior CT recent fall and trauma EXAM: MRI ABDOMEN WITHOUT AND WITH CONTRAST (INCLUDING MRCP) TECHNIQUE: Multiplanar multisequence MR imaging of the abdomen was performed both before and after the administration of intravenous contrast. Heavily T2-weighted images of the biliary and pancreatic ducts were obtained, and three-dimensional MRCP images were rendered by post processing. CONTRAST:  5mL GADAVIST GADOBUTROL 1 MMOL/ML IV SOLN COMPARISON:  CT chest abdomen pelvis, 03/22/2021 FINDINGS: Lower chest: No acute findings. Hepatobiliary: No mass or other parenchymal abnormality identified. Severely distended gallbladder. No calculi identified. There is minimal gallbladder wall thickening and inflammatory fat stranding in the adjacent gallbladder fossa (series 10, image 23). There is severe intra and extrahepatic biliary ductal dilatation to the ampulla, the common bile duct measuring up to 1.6 cm in caliber. There is a relatively abrupt truncation of the common bile duct near  the ampulla (series 16, image 33). Pancreas: The pancreatic parenchyma is mildly atrophic without discrete mass or other abnormality identified. The pancreatic duct is nondilated. Spleen:  Within normal limits in size and appearance. Adrenals/Urinary Tract: No masses identified. No evidence of hydronephrosis. Stomach/Bowel: Visualized portions within the abdomen are unremarkable. Vascular/Lymphatic: No pathologically enlarged lymph nodes identified. No abdominal aortic aneurysm demonstrated. Aortic atherosclerosis. Other:  None. Musculoskeletal: No suspicious bone lesions identified. IMPRESSION: 1. Severely distended gallbladder with minimal gallbladder wall thickening and inflammatory fat stranding in the adjacent gallbladder fossa. No calculi identified. Findings are  concerning for acute cholecystitis. 2. There is severe intra and extrahepatic biliary ductal dilatation to the ampulla, the common bile duct measuring up to 1.6 cm in caliber. There is a relatively abrupt truncation of the common bile duct near the ampulla however no discrete mass or calculus is appreciated. ERCP may be helpful to evaluate for subtle ampullary mass. Electronically Signed   By: Lauralyn Primes M.D.   On: 03/23/2021 10:56   CT CHEST ABDOMEN PELVIS WO CONTRAST  Result Date: 03/22/2021 CLINICAL DATA:  Larey Seat and hit left chest EXAM: CT CHEST, ABDOMEN AND PELVIS WITHOUT CONTRAST TECHNIQUE: Multidetector CT imaging of the chest, abdomen and pelvis was performed following the standard protocol without IV contrast. COMPARISON:  None. FINDINGS: CT CHEST FINDINGS Cardiovascular: Limited evaluation without intravenous contrast. Advanced aortic atherosclerosis. No aneurysm. Coronary vascular calcification. Normal cardiac size. No pericardial effusion Mediastinum/Nodes: Midline trachea. Suspicion of 1.8 cm right thyroid nodule. No suspicious nodes. Esophagus within normal limits Lungs/Pleura: No acute consolidation, pleural effusion, or pneumothorax. Musculoskeletal: Sternum is intact. Severe compression fracture of T5. Acute nondisplaced left eleventh posterior rib fracture. CT ABDOMEN PELVIS FINDINGS Hepatobiliary: No focal hepatic abnormality. Markedly distended gallbladder with mild surrounding edema. No definite calcified stone. Intra and extrahepatic biliary dilatation, extrahepatic common bile duct dilated up to 15 mm. Pancreas: No inflammatory changes.  No ductal dilatation. Spleen: Normal in size without focal abnormality. Adrenals/Urinary Tract: Adrenal glands are unremarkable. Kidneys are normal, without renal calculi, focal lesion, or hydronephrosis. Bladder is unremarkable. Stomach/Bowel: Stomach is nonenlarged. No dilated small bowel. Fluid-filled bowel without obstructive pattern. No acute bowel  wall thickening Vascular/Lymphatic: Advanced aortic atherosclerosis without aneurysm. No suspicious nodes. Reproductive: Large calcified left uterine fibroid.  No adnexal mass Other: Negative for free air or free fluid. Musculoskeletal: No evidence for pelvic fracture. Orthopedic hardware in the right femur. Grade 1 anterolisthesis L4 on L5. IMPRESSION: 1. No CT evidence for acute intrathoracic abnormality. Severe compression fracture T5, see separately dictated CT thoracic and lumbar reports. Acute nondisplaced left eleventh posterior rib fracture. 2. Markedly distended gallbladder with surrounding soft tissue stranding, raising possibility of cholecystitis. Recommend correlation with ultrasound. There is moderate to marked intra and extrahepatic biliary dilatation, suspicious for ductal obstruction, this may be evaluated with MRCP. 3. Large calcified uterine fibroid 4. Suspected 1.8 cm right thyroid nodule. Recommend thyroid US (ref: J Am Coll Radiol. 2015 Feb;12(2): 143-50). This may be performed non emergently. Electronically Signed   By: Jasmine Pang M.D.   On: 03/22/2021 23:32     Scheduled Meds:   . [MAR Hold] nicotine  7 mg Transdermal Daily    Continuous Infusions:   . sodium chloride 75 mL/hr at 03/24/21 0644     LOS: 2 days     Marcellus Scott, MD, [place], [facility]. [facility]    To contact the attending provider between 7A-7P or the covering provider during after hours 7P-7A, please log into the  web site [email or web address] and access using universal [place] password for that web site. If you do not have the password, please call the hospital operator.  03/24/2021, 1:38 PM

## 2021-03-25 DIAGNOSIS — K269 Duodenal ulcer, unspecified as acute or chronic, without hemorrhage or perforation: Secondary | ICD-10-CM | POA: Diagnosis not present

## 2021-03-25 DIAGNOSIS — K831 Obstruction of bile duct: Secondary | ICD-10-CM | POA: Diagnosis not present

## 2021-03-25 DIAGNOSIS — R7989 Other specified abnormal findings of blood chemistry: Secondary | ICD-10-CM | POA: Diagnosis not present

## 2021-03-25 LAB — COMPREHENSIVE METABOLIC PANEL
ALT: 313 U/L — ABNORMAL HIGH (ref 0–44)
AST: 359 U/L — ABNORMAL HIGH (ref 15–41)
Albumin: 2.3 g/dL — ABNORMAL LOW (ref 3.5–5.0)
Alkaline Phosphatase: 981 U/L — ABNORMAL HIGH (ref 38–126)
Anion gap: 9 (ref 5–15)
BUN: 12 mg/dL (ref 8–23)
CO2: 23 mmol/L (ref 22–32)
Calcium: 8.4 mg/dL — ABNORMAL LOW (ref 8.9–10.3)
Chloride: 103 mmol/L (ref 98–111)
Creatinine, Ser: 0.78 mg/dL (ref 0.44–1.00)
GFR, Estimated: >60 mL/min (ref >60)
Glucose, Bld: 117 mg/dL — ABNORMAL HIGH (ref 70–99)
Potassium: 3.7 mmol/L (ref 3.5–5.1)
Sodium: 135 mmol/L (ref 135–145)
Total Bilirubin: 10.1 mg/dL — ABNORMAL HIGH (ref 0.3–1.2)
Total Protein: 5.4 g/dL — ABNORMAL LOW (ref 6.5–8.1)

## 2021-03-25 LAB — CBC
HCT: 28.3 % — ABNORMAL LOW (ref 36.0–46.0)
Hemoglobin: 10.2 g/dL — ABNORMAL LOW (ref 12.0–15.0)
MCH: 31.9 pg (ref 26.0–34.0)
MCHC: 36 g/dL (ref 30.0–36.0)
MCV: 88.4 fL (ref 80.0–100.0)
Platelets: 563 10*3/uL — ABNORMAL HIGH (ref 150–400)
RBC: 3.2 MIL/uL — ABNORMAL LOW (ref 3.87–5.11)
RDW: 15.9 % — ABNORMAL HIGH (ref 11.5–15.5)
WBC: 7.5 10*3/uL (ref 4.0–10.5)
nRBC: 0 % (ref 0.0–0.2)

## 2021-03-25 MED ORDER — ACETAMINOPHEN 325 MG PO TABS: 325.0000 mg | ORAL_TABLET | Freq: Four times a day (QID) | ORAL | Status: DC | PRN

## 2021-03-25 MED ORDER — NICOTINE 7 MG/24HR TD PT24: 7.0000 mg | MEDICATED_PATCH | Freq: Every day | TRANSDERMAL | 0 refills | Status: DC

## 2021-03-25 MED ORDER — PANTOPRAZOLE SODIUM 40 MG PO TBEC: 40.0000 mg | DELAYED_RELEASE_TABLET | Freq: Two times a day (BID) | ORAL | 0 refills | Status: DC

## 2021-03-25 MED ORDER — PANTOPRAZOLE SODIUM 40 MG PO TBEC
40.0000 mg | DELAYED_RELEASE_TABLET | Freq: Two times a day (BID) | ORAL | Status: DC
  Administered 2021-03-25: 40 mg via ORAL
  Filled 2021-03-25: qty 1

## 2021-03-25 NOTE — Progress Notes (Signed)
Patient discharged to home with husband via wheelchair with all belongings. 

## 2021-03-25 NOTE — Discharge Summary (Addendum)
Physician Discharge Summary  Alicia Spencer:[id number] DOB: 1941/04/22  PCP: [name], MD  Admitted from: Home Discharged to: Home  Admit date: 03/22/2021 Discharge date: 03/25/2021  Recommendations for Outpatient Follow-up:    Follow-up Information    [name], MD. Schedule an appointment as soon as possible for a visit in 1 week(s).   Specialty: Family Medicine Why: To be seen with repeat labs (CBC & CMP).  Kindly follow ERCP pathology report and consider outpatient general surgery follow-up. Contact information: [address] [place] [address]        [name], MD Follow up.   Specialty: Gastroenterology Why: MDs office will call you to arrange appointment for office visit and to go over pathology results. Contact information: [address] [place] [address] [phone]        [facility] Follow up.   Specialty: Home Health Services Why: The name of the company is now [employer]. The office will call you to schedule home health physical therapy visits in the next 48 hours.   Contact information: [address] [place] [address] [phone]                Home Health:  Home Health Orders (From admission, onward)    Start     Ordered   03/25/21 1311  Home Health  At discharge       Question:  To provide the following care/treatments  Answer:  PT   03/25/21 1314           Equipment/Devices:     Durable Medical Equipment  (From admission, onward)         Start     Ordered   03/25/21 1312  DME 3-in-1  Once        03/25/21 1314           Discharge Condition: Improved and stable.   Code Status: Full Code Diet recommendation:  Discharge Diet Orders (From admission, onward)    Start     Ordered   03/25/21 0000  Diet - low sodium heart healthy        03/25/21 1314           Discharge Diagnoses:  Active Problems:   Bile duct stricture   Biliary obstruction   Duodenal ulcer   Brief  Summary:  80 year old female with history of tobacco use disorder, no other known significant past medical history, not on prescription meds, presented to ED due to worsening epigastric and back pain of 1 week duration and nausea, vomiting and jaundice of 1 day duration.  History of mechanical fall approximately 2 weeks PTA, leading to mid back pain for which she had been taking OTC meds.  Admitted with diagnosis of possible obstructive jaundice, transaminitis and concern for acute cholecystitis.  [facility] evaluated and s/p ERCP on 6/3.  General surgery consulted.   Assessment & Plan:    Abdominal pain with obstructive jaundice, acute transaminitis, due to distal biliary stricture, malignant versus secondary to edema from ulceration of the ampulla: Admitting labs significant for alkaline phosphatase 1068, AST 578, ALT 458, total bilirubin 16.4.  Lipase normal.  INR 1.  Serum acetaminophen, salicylate level and blood alcohol levels negative.  Acute hepatitis panel negative.  CT thoracic spine 6/1: Markedly distended gallbladder.  CT chest abdomen and pelvis 6/1: Markedly distended gallbladder with surrounding soft tissue stranding, raising possibility of cholecystitis.  There is moderate to marked intra and  extrahepatic biliary dilatation, suspicious for ductal obstruction.  MRCP 6/2: Severely distended gallbladder with minimal gallbladder wall thickening and inflammatory fat stranding in the adjacent gallbladder fossa.  No calculi identified.  Findings are concerning for acute cholecystitis.  Severe intra and extrahepatic biliary ductal dilatation to the ampulla, CBD measuring up to 1.6 cm in caliber.  Relatively abrupt truncation of the CBD near the ampulla however no discrete mass or calculus appreciated.  GI input appreciated and patient underwent ERCP, sphincterotomy and biliary stent placement 6/3.  GI has seen in follow-up today and have cleared patient for discharge home.  They will follow-up on  biopsies/brushings and arrange outpatient follow-up in a few weeks.  Patient tolerating diet.  No pain reported. General surgery input appreciated and do not feel that she has cholecystitis or cholangitis, have canceled HIDA scan and recommend stopping antibiotics.  General surgery await results of pathology prior to any further plans.  PCP or GI can arrange outpatient follow-up with general surgery as needed.  LFTs starting to improve her total bilirubin which is decreasing and so is her icterus.  Multiple duodenal ulcers: Patient on scheduled Naprosyn at home.  As confirmed with GI, strongly recommend stopping all NSAIDs.  May use Tylenol for pain.  No intrinsic liver dysfunction suspected, abnormal LFTs due to obstructive etiology and likely to improve post drainage procedure as noted above.  Recommends continuing PPI twice daily until further notice.  GI will follow up with results of pathology.  S/p recent mechanical fall/T5 compression fracture/acute nondisplaced left 11th posterior rib fracture: CT thoracic spine: 6/1 showed burst type fracture of T5 with approximately 50% height loss and 2 mm retropulsion.  No extension to the posterior elements.  Markedly distended gallbladder.  CT chest abdomen and pelvis 6/1: Severe compression fracture T5 and acute nondisplaced left 11th posterior rib fracture.  Other extensive imaging (chest x-ray with ribs, CT C-spine, CT head, CT L-spine) without acute abnormalities.  Supportive treatment with pain management, PT and OT evaluated and arranging home health PT and DME as noted above.  Patient has not been in much pain during my evaluations in the hospital.   Consider outpatient evaluation including checking vitamin D level, replacing as needed, bone density scan and spine/neurosurgery consultation for further evaluation and management.  Hyperammonemia/agitated delirium complicating possible underlying dementia: Hyperammonemia secondary to obstructive jaundice.   Minimal and less likely to be hepatic encephalopathy. Baseline mental status not known.  It is possible that patient has underlying cognitive impairment/possible dementia and needs to be verified with family- have been unable to reach patient's spouse on the phone numbers listed for 2 days in a row.  I will attempt to speak with him when he comes by this afternoon to see his wife as informed by nursing.  Delirium precautions.  Did get a dose of Haldol 6/2 afternoon.  Pleasantly confused without agitation today.  Safety sitter at bedside.  Suspect that her mental status is at baseline.  Addendum: I did have the opportunity to speak with patient's daughter in detail at time of discharge.  She indicated that patient's current mental status is much improved compared to prior and is close to her baseline.  Although no formal diagnosis of dementia has been made, it is possible that patient has underlying cognitive impairment/dementia.  This can be further evaluated as outpatient.  According to daughter, recent use of muscle relaxants made this worse and hence those were discontinued.  Reiterated avoiding NSAIDs, use of Tylenol only for pain,  outpatient follow-up with providers as documented above.  She verbalized understanding  Dehydration with hyponatremia: Secondary to poor oral intake.  Resolved.  Tobacco use disorder: Cessation counseling when patient or family able to comprehend.  Continue nicotine patch.  Anemia: Could be dilutional.  Stable.  Thyroid nodules: Bilateral thyroid nodules measuring up to 2 cm in the right thyroid lobe noted on CT C-spine 6/1.  CT chest abdomen and pelvis 6/1: Suspected 1.8 cm right thyroid nodule.  Recommend nonemergent thyroid ultrasound, which can be performed and followed up as outpatient.  Hypokalemia: Replaced  Body mass index is 23.44 kg/m.       Consultants:   [facility] General surgery  Procedures:   ERCP by Dr. Yancey Flemings, [facility] on  03/24/2021:  Impression: 1. Ulcerative changes in the duodenal bulb and second duodenum including a large ulcer involving the major papilla. Status post biopsies 2. Abrupt cut off of the distal bile duct. Appeared to be distal biliary stricture. Cannot rule out edema from ulceration at the ampulla. Status post brushings 3. Status post enterotomy with biliary stent placement.  Recommendations: 1. Standard post ERCP observation and care 2. Follow-up cytology and biopsies 3. Trend liver tests   Discharge Instructions  Discharge Instructions    Call MD for:   Complete by: As directed    Worsening jaundice.   Call MD for:  difficulty breathing, headache or visual disturbances   Complete by: As directed    Call MD for:  extreme fatigue   Complete by: As directed    Call MD for:  persistant dizziness or light-headedness   Complete by: As directed    Call MD for:  persistant nausea and vomiting   Complete by: As directed    Call MD for:  severe uncontrolled pain   Complete by: As directed    Call MD for:  temperature >100.4   Complete by: As directed    Diet - low sodium heart healthy   Complete by: As directed    Increase activity slowly   Complete by: As directed        Medication List    STOP taking these medications   methocarbamol 500 MG tablet Commonly known as: ROBAXIN   naproxen 500 MG tablet Commonly known as: NAPROSYN     TAKE these medications   acetaminophen 325 MG tablet Commonly known as: Tylenol Take 1 tablet (325 mg total) by mouth every 6 (six) hours as needed for mild pain or moderate pain.   nicotine 7 mg/24hr patch Commonly known as: NICODERM CQ - dosed in mg/24 hr Place 1 patch (7 mg total) onto the skin daily. Start taking on: March 26, 2021   pantoprazole 40 MG tablet Commonly known as: PROTONIX Take 1 tablet (40 mg total) by mouth 2 (two) times daily.      Allergies  Allergen Reactions  . Sulfites Anaphylaxis  . Iodine Other (See  Comments)    welts      Procedures/Studies: DG Ribs Unilateral W/Chest Left  Result Date: 03/22/2021 CLINICAL DATA:  80 year old female with fall. EXAM: LEFT RIBS AND CHEST - 3+ VIEW COMPARISON:  None. FINDINGS: No focal consolidation, pleural effusion, or pneumothorax. The cardiac silhouette is within limits. Atherosclerotic calcification of the aorta. Osteopenia with degenerative changes of the spine. Evaluation for rib fracture is very limited due to advanced osteopenia. No obvious or displaced rib fracture identified. IMPRESSION: Negative. Electronically Signed   By: Elgie Collard M.D.   On: 03/22/2021 16:30  CT Head Wo Contrast  Result Date: 03/22/2021 CLINICAL DATA:  Fall EXAM: CT HEAD WITHOUT CONTRAST TECHNIQUE: Contiguous axial images were obtained from the base of the skull through the vertex without intravenous contrast. COMPARISON:  None. FINDINGS: Brain: There is atrophy and chronic small vessel disease changes. No acute intracranial abnormality. Specifically, no hemorrhage, hydrocephalus, mass lesion, acute infarction, or significant intracranial injury. Vascular: No hyperdense vessel or unexpected calcification. Skull: No acute calvarial abnormality. Sinuses/Orbits: Visualized paranasal sinuses and mastoids clear. Orbital soft tissues unremarkable. Other: None IMPRESSION: Atrophy, chronic microvascular disease. No acute intracranial abnormality. Electronically Signed   By: Charlett Nose M.D.   On: 03/22/2021 22:50   CT Cervical Spine Wo Contrast  Result Date: 03/22/2021 CLINICAL DATA:  Fall EXAM: CT CERVICAL SPINE WITHOUT CONTRAST TECHNIQUE: Multidetector CT imaging of the cervical spine was performed without intravenous contrast. Multiplanar CT image reconstructions were also generated. COMPARISON:  None. FINDINGS: Alignment: No subluxation. Skull base and vertebrae: No acute fracture. No primary bone lesion or focal pathologic process. Soft tissues and spinal canal: No prevertebral  fluid or swelling. No visible canal hematoma. Disc levels: Degenerative disc disease most pronounced at C5-6 and C6-7. Bilateral degenerative facet disease, left greater than right. Upper chest: No acute findings Other: Nodule replaces much of the right thyroid lobe measuring up to 2 cm. Smaller nodule in the left thyroid lobe measuring 9 mm. IMPRESSION: Degenerative disc and facet disease.  No acute bony abnormality. Bilateral thyroid nodules measuring up 2 cm in the right thyroid lobe. Recommend non emergent thyroid US (ref: J Am Coll Radiol. 2015 Feb;12(2): 143-50). Electronically Signed   By: Charlett Nose M.D.   On: 03/22/2021 22:53   MR 3D Recon At Scanner  Result Date: 03/23/2021 CLINICAL DATA:  Jaundice, distended gallbladder on prior CT recent fall and trauma EXAM: MRI ABDOMEN WITHOUT AND WITH CONTRAST (INCLUDING MRCP) TECHNIQUE: Multiplanar multisequence MR imaging of the abdomen was performed both before and after the administration of intravenous contrast. Heavily T2-weighted images of the biliary and pancreatic ducts were obtained, and three-dimensional MRCP images were rendered by post processing. CONTRAST:  52mL GADAVIST GADOBUTROL 1 MMOL/ML IV SOLN COMPARISON:  CT chest abdomen pelvis, 03/22/2021 FINDINGS: Lower chest: No acute findings. Hepatobiliary: No mass or other parenchymal abnormality identified. Severely distended gallbladder. No calculi identified. There is minimal gallbladder wall thickening and inflammatory fat stranding in the adjacent gallbladder fossa (series 10, image 23). There is severe intra and extrahepatic biliary ductal dilatation to the ampulla, the common bile duct measuring up to 1.6 cm in caliber. There is a relatively abrupt truncation of the common bile duct near the ampulla (series 16, image 33). Pancreas: The pancreatic parenchyma is mildly atrophic without discrete mass or other abnormality identified. The pancreatic duct is nondilated. Spleen:  Within normal limits in  size and appearance. Adrenals/Urinary Tract: No masses identified. No evidence of hydronephrosis. Stomach/Bowel: Visualized portions within the abdomen are unremarkable. Vascular/Lymphatic: No pathologically enlarged lymph nodes identified. No abdominal aortic aneurysm demonstrated. Aortic atherosclerosis. Other:  None. Musculoskeletal: No suspicious bone lesions identified. IMPRESSION: 1. Severely distended gallbladder with minimal gallbladder wall thickening and inflammatory fat stranding in the adjacent gallbladder fossa. No calculi identified. Findings are concerning for acute cholecystitis. 2. There is severe intra and extrahepatic biliary ductal dilatation to the ampulla, the common bile duct measuring up to 1.6 cm in caliber. There is a relatively abrupt truncation of the common bile duct near the ampulla however no discrete mass or calculus is appreciated.  ERCP may be helpful to evaluate for subtle ampullary mass. Electronically Signed   By: Lauralyn Primes M.D.   On: 03/23/2021 10:56   CT T-SPINE NO CHARGE  Result Date: 03/22/2021 CLINICAL DATA:  Fall EXAM: CT Thoracic and Lumbar spine without contrast TECHNIQUE: Multiplanar CT images of the thoracic and lumbar spine were reconstructed from contemporary CT of the Chest, Abdomen, and Pelvis CONTRAST:  No additional contrast COMPARISON:  None FINDINGS: CT THORACIC SPINE FINDINGS Alignment: Normal. Vertebrae: Burst type fracture of T5 with approximately 50% height loss. 2 mm retropulsion. No extension to the posterior elements. Paraspinal and other soft tissues: Calcific aortic atherosclerosis. Disc levels: No spinal canal stenosis. CT LUMBAR SPINE FINDINGS Segmentation: 5 lumbar type vertebrae. Alignment: Grade 1 anterolisthesis at L4-5 Vertebrae: No acute fracture or focal pathologic process. Paraspinal and other soft tissues: Calcific aortic atherosclerosis. Markedly distended gallbladder. Disc levels: Mild spinal canal stenosis at L4-5 secondary to  anterolisthesis caused by facet arthrosis. No neural impingement. Moderate right L5-S1 neural foraminal stenosis. IMPRESSION: 1. Burst type fracture of T5 with approximately 50% height loss and 2 mm retropulsion. No extension to the posterior elements. 2. No acute fracture of the lumbar spine. 3. Mild spinal canal stenosis at L4-5 secondary to anterolisthesis and facet arthrosis. 4. Moderate right L5-S1 neural foraminal stenosis. 5. Markedly distended gallbladder. Aortic Atherosclerosis (ICD10-I70.0). Electronically Signed   By: Deatra Robinson M.D.   On: 03/22/2021 22:59   CT L-SPINE NO CHARGE  Result Date: 03/22/2021 CLINICAL DATA:  Fall EXAM: CT Thoracic and Lumbar spine without contrast TECHNIQUE: Multiplanar CT images of the thoracic and lumbar spine were reconstructed from contemporary CT of the Chest, Abdomen, and Pelvis CONTRAST:  No additional contrast COMPARISON:  None FINDINGS: CT THORACIC SPINE FINDINGS Alignment: Normal. Vertebrae: Burst type fracture of T5 with approximately 50% height loss. 2 mm retropulsion. No extension to the posterior elements. Paraspinal and other soft tissues: Calcific aortic atherosclerosis. Disc levels: No spinal canal stenosis. CT LUMBAR SPINE FINDINGS Segmentation: 5 lumbar type vertebrae. Alignment: Grade 1 anterolisthesis at L4-5 Vertebrae: No acute fracture or focal pathologic process. Paraspinal and other soft tissues: Calcific aortic atherosclerosis. Markedly distended gallbladder. Disc levels: Mild spinal canal stenosis at L4-5 secondary to anterolisthesis caused by facet arthrosis. No neural impingement. Moderate right L5-S1 neural foraminal stenosis. IMPRESSION: 1. Burst type fracture of T5 with approximately 50% height loss and 2 mm retropulsion. No extension to the posterior elements. 2. No acute fracture of the lumbar spine. 3. Mild spinal canal stenosis at L4-5 secondary to anterolisthesis and facet arthrosis. 4. Moderate right L5-S1 neural foraminal stenosis.  5. Markedly distended gallbladder. Aortic Atherosclerosis (ICD10-I70.0). Electronically Signed   By: Deatra Robinson M.D.   On: 03/22/2021 22:59   DG ERCP  Result Date: 03/24/2021 CLINICAL DATA:  Obstructed jaundice EXAM: ERCP TECHNIQUE: Multiple spot images obtained with the fluoroscopic device and submitted for interpretation post-procedure. FLUOROSCOPY TIME:  Fluoroscopy Time:  9 minutes 22 seconds Radiation Exposure Index (if provided by the fluoroscopic device): 25.5 mGy Number of Acquired Spot Images: 0 COMPARISON:  MRCP 03/23/2021 FINDINGS: Nine intraoperative saved images are submitted for review. The images demonstrate a flexible duodenal scope in the descending duodenum with wire cannulation of the intrahepatic ducts. Cholangiogram demonstrates marked intra and extrahepatic biliary ductal dilatation. There is occlusion of the distal most bile duct. The appearance is nonspecific. Subsequent images demonstrate sphincterotomy and placement of a plastic biliary stent. IMPRESSION: Distal obstruction of the common bile duct resulting in marked intra  and extrahepatic biliary ductal dilatation. The etiology of obstruction is unclear by imaging. Sphincterotomy and placement of plastic biliary stent. These images were submitted for radiologic interpretation only. Please see the procedural report for the amount of contrast and the fluoroscopy time utilized. Electronically Signed   By: Malachy Moan M.D.   On: 03/24/2021 16:38   MR ABDOMEN MRCP W WO CONTAST  Result Date: 03/23/2021 CLINICAL DATA:  Jaundice, distended gallbladder on prior CT recent fall and trauma EXAM: MRI ABDOMEN WITHOUT AND WITH CONTRAST (INCLUDING MRCP) TECHNIQUE: Multiplanar multisequence MR imaging of the abdomen was performed both before and after the administration of intravenous contrast. Heavily T2-weighted images of the biliary and pancreatic ducts were obtained, and three-dimensional MRCP images were rendered by post processing.  CONTRAST:  5mL GADAVIST GADOBUTROL 1 MMOL/ML IV SOLN COMPARISON:  CT chest abdomen pelvis, 03/22/2021 FINDINGS: Lower chest: No acute findings. Hepatobiliary: No mass or other parenchymal abnormality identified. Severely distended gallbladder. No calculi identified. There is minimal gallbladder wall thickening and inflammatory fat stranding in the adjacent gallbladder fossa (series 10, image 23). There is severe intra and extrahepatic biliary ductal dilatation to the ampulla, the common bile duct measuring up to 1.6 cm in caliber. There is a relatively abrupt truncation of the common bile duct near the ampulla (series 16, image 33). Pancreas: The pancreatic parenchyma is mildly atrophic without discrete mass or other abnormality identified. The pancreatic duct is nondilated. Spleen:  Within normal limits in size and appearance. Adrenals/Urinary Tract: No masses identified. No evidence of hydronephrosis. Stomach/Bowel: Visualized portions within the abdomen are unremarkable. Vascular/Lymphatic: No pathologically enlarged lymph nodes identified. No abdominal aortic aneurysm demonstrated. Aortic atherosclerosis. Other:  None. Musculoskeletal: No suspicious bone lesions identified. IMPRESSION: 1. Severely distended gallbladder with minimal gallbladder wall thickening and inflammatory fat stranding in the adjacent gallbladder fossa. No calculi identified. Findings are concerning for acute cholecystitis. 2. There is severe intra and extrahepatic biliary ductal dilatation to the ampulla, the common bile duct measuring up to 1.6 cm in caliber. There is a relatively abrupt truncation of the common bile duct near the ampulla however no discrete mass or calculus is appreciated. ERCP may be helpful to evaluate for subtle ampullary mass. Electronically Signed   By: Lauralyn Primes M.D.   On: 03/23/2021 10:56   CT CHEST ABDOMEN PELVIS WO CONTRAST  Result Date: 03/22/2021 CLINICAL DATA:  Larey Seat and hit left chest EXAM: CT CHEST,  ABDOMEN AND PELVIS WITHOUT CONTRAST TECHNIQUE: Multidetector CT imaging of the chest, abdomen and pelvis was performed following the standard protocol without IV contrast. COMPARISON:  None. FINDINGS: CT CHEST FINDINGS Cardiovascular: Limited evaluation without intravenous contrast. Advanced aortic atherosclerosis. No aneurysm. Coronary vascular calcification. Normal cardiac size. No pericardial effusion Mediastinum/Nodes: Midline trachea. Suspicion of 1.8 cm right thyroid nodule. No suspicious nodes. Esophagus within normal limits Lungs/Pleura: No acute consolidation, pleural effusion, or pneumothorax. Musculoskeletal: Sternum is intact. Severe compression fracture of T5. Acute nondisplaced left eleventh posterior rib fracture. CT ABDOMEN PELVIS FINDINGS Hepatobiliary: No focal hepatic abnormality. Markedly distended gallbladder with mild surrounding edema. No definite calcified stone. Intra and extrahepatic biliary dilatation, extrahepatic common bile duct dilated up to 15 mm. Pancreas: No inflammatory changes.  No ductal dilatation. Spleen: Normal in size without focal abnormality. Adrenals/Urinary Tract: Adrenal glands are unremarkable. Kidneys are normal, without renal calculi, focal lesion, or hydronephrosis. Bladder is unremarkable. Stomach/Bowel: Stomach is nonenlarged. No dilated small bowel. Fluid-filled bowel without obstructive pattern. No acute bowel wall thickening Vascular/Lymphatic: Advanced aortic atherosclerosis without  aneurysm. No suspicious nodes. Reproductive: Large calcified left uterine fibroid.  No adnexal mass Other: Negative for free air or free fluid. Musculoskeletal: No evidence for pelvic fracture. Orthopedic hardware in the right femur. Grade 1 anterolisthesis L4 on L5. IMPRESSION: 1. No CT evidence for acute intrathoracic abnormality. Severe compression fracture T5, see separately dictated CT thoracic and lumbar reports. Acute nondisplaced left eleventh posterior rib fracture. 2.  Markedly distended gallbladder with surrounding soft tissue stranding, raising possibility of cholecystitis. Recommend correlation with ultrasound. There is moderate to marked intra and extrahepatic biliary dilatation, suspicious for ductal obstruction, this may be evaluated with MRCP. 3. Large calcified uterine fibroid 4. Suspected 1.8 cm right thyroid nodule. Recommend thyroid US (ref: J Am Coll Radiol. 2015 Feb;12(2): 143-50). This may be performed non emergently. Electronically Signed   By: Jasmine PangKim  Fujinaga M.D.   On: 03/22/2021 23:32      Subjective: Pleasantly confused.  Oriented only to self.  Denies complaints.  As per RN, no acute issues noted, tolerating diet and ambulating in the room.  At times gets intermittently agitated  Discharge Exam:  Vitals:   03/24/21 1630 03/24/21 2001 03/25/21 0408 03/25/21 0442  BP: (!) 166/98 (!) 149/87 140/80 (!) 147/96  Pulse: 79 96 82 79  Resp: 19 18 17 16   Temp:  97.8 F (36.6 C) 98.4 F (36.9 C) 99.3 F (37.4 C)  TempSrc:  Oral Oral Oral  SpO2: 93% 96% 98% 97%  Weight:      Height:        General exam: Elderly female, moderately built and frail lying sitting up comfortably in reclining chair.    Scleral and skin icterus better than yesterday. Respiratory system: Clear to auscultation.  No increased work of breathing. Cardiovascular system: S1 and S2 heard, RRR.  No JVD, murmurs or pedal edema.  Gastrointestinal system: Abdomen is nondistended, soft and nontender.  No organomegaly or masses felt. Normal bowel sounds heard. Central nervous system: Alert and oriented only to self.    Pleasantly confused.  Follows simple instructions. No focal neurological deficits. Extremities: Symmetric 5 x 5 power. Skin: No rashes, lesions or ulcers.  Right hip/thigh long vertical surgical scar. Psychiatry: Judgement and insight impaired. Mood & affect pleasantly confused, joking with the sitter.     The results of significant diagnostics from this  hospitalization (including imaging, microbiology, ancillary and laboratory) are listed below for reference.     Microbiology: Recent Results (from the past 240 hour(s))  SARS CORONAVIRUS 2 (TAT 6-24 HRS) Nasopharyngeal Nasopharyngeal Swab     Status: None   Collection Time: 03/22/21 11:51 PM   Specimen: Nasopharyngeal Swab  Result Value Ref Range Status   SARS Coronavirus 2 NEGATIVE NEGATIVE Final    Comment: (NOTE) SARS-CoV-2 target nucleic acids are NOT DETECTED.  The SARS-CoV-2 RNA is generally detectable in upper and lower respiratory specimens during the acute phase of infection. Negative results do not preclude SARS-CoV-2 infection, do not rule out co-infections with other pathogens, and should not be used as the sole basis for treatment or other patient management decisions. Negative results must be combined with clinical observations, patient history, and epidemiological information. The expected result is Negative.  Fact Sheet for Patients: HairSlick.nohttps://www.fda.gov/media/138098/download  Fact Sheet for Healthcare Providers: quierodirigir.comhttps://www.fda.gov/media/138095/download  This test is not yet approved or cleared by the [place] FDA and  has been authorized for detection and/or diagnosis of SARS-CoV-2 by FDA under an Emergency Use Authorization (EUA). This EUA will remain  in effect (meaning  this test can be used) for the duration of the COVID-19 declaration under Se ction 564(b)(1) of the Act, 21 U.S.C. section 360bbb-3(b)(1), unless the authorization is terminated or revoked sooner.  Performed at [facility], [address] [place], [place] [address]   Culture, blood (routine x 2)     Status: None (Preliminary result)   Collection Time: 03/23/21  1:05 AM   Specimen: BLOOD  Result Value Ref Range Status   Specimen Description BLOOD LEFT ARM  Final   Special Requests   Final    BOTTLES DRAWN AEROBIC AND ANAEROBIC Blood Culture adequate volume   Culture   Final     NO GROWTH 1 DAY Performed at [facility], [address], [place], [place] [address]    Report Status PENDING  Incomplete  Culture, blood (routine x 2)     Status: None (Preliminary result)   Collection Time: 03/23/21  1:10 AM   Specimen: BLOOD  Result Value Ref Range Status   Specimen Description BLOOD RIGHT WRIST  Final   Special Requests   Final    BOTTLES DRAWN AEROBIC AND ANAEROBIC Blood Culture results may not be optimal due to an inadequate volume of blood received in culture bottles   Culture   Final    NO GROWTH 1 DAY Performed at [facility], [address], [place], [place] [address]    Report Status PENDING  Incomplete     Labs: CBC: Recent Labs  Lab 03/22/21 1602 03/23/21 0300 03/24/21 0649 03/24/21 1810 03/25/21 0124  WBC 7.7 7.8 6.7 7.5 7.5  NEUTROABS 5.0  --   --   --   --   HGB 12.5 11.5* 11.8* 11.4* 10.2*  HCT 34.4* 32.3* 31.5* 31.1* 28.3*  MCV 90.1 90.0 87.5 88.1 88.4  PLT 567* 557* 545* 574* 563*    Basic Metabolic Panel: Recent Labs  Lab 03/22/21 1602 03/23/21 0300 03/24/21 0649 03/25/21 0124  NA 132* 133* 137 135  K 3.5 3.6 3.4* 3.7  CL 96* 96* 104 103  CO2 GLUCOSE 116* 105* 95 117*  BUN CREATININE 0.88 0.66 0.65 0.78  CALCIUM 9.1 9.0 8.8* 8.4*  MG  --  2.1  --   --   PHOS  --  4.6  --   --     Liver Function Tests: Recent Labs  Lab 03/22/21 1602 03/23/21 0300 03/24/21 0649 03/25/21 0124  AST 578* 481* 517* 359*  ALT 458* 396* 378* 313*  ALKPHOS 1,068* 1,079* 1,010* 981*  BILITOT 16.4* 15.8* 16.1* 10.1*  PROT 6.7 6.8 6.0* 5.4*  ALBUMIN 3.2* 3.1* 2.7* 2.3*    Thyroid function studies Recent Labs    03/23/21 0014  TSH 1.178     Urinalysis    Component Value Date/Time   COLORURINE AMBER (A) 03/22/2021 2019   APPEARANCEUR HAZY (A) 03/22/2021 2019   LABSPEC 1.013 03/22/2021 2019   PHURINE 5.0 03/22/2021 2019   GLUCOSEU NEGATIVE 03/22/2021 2019   HGBUR NEGATIVE 03/22/2021  2019   BILIRUBINUR MODERATE (A) 03/22/2021 2019   KETONESUR 5 (A) 03/22/2021 2019   PROTEINUR NEGATIVE 03/22/2021 2019   NITRITE NEGATIVE 03/22/2021 2019   LEUKOCYTESUR TRACE (A) 03/22/2021 2019      Time coordinating discharge: 35 minutes  SIGNED:  Marcellus Scott, MD, FACP, [facility]. [facility]  To contact the attending provider between 7A-7P or the covering provider during after hours 7P-7A, please log  into the web site [email or web address] and access using universal [place] password for that web site. If you do not have the password, please call the hospital operator.

## 2021-03-25 NOTE — Discharge Instructions (Signed)

## 2021-03-25 NOTE — TOC Transition Note (Signed)
Transition of Care [facility]) - CM/SW Discharge Note   Patient Details  Name: Alicia Spencer MRN: [id number] Date of Birth: 1941/08/08  [facility]) CM/SW Contact:  Bess Kinds, RN Phone Number: [phone] 03/25/2021, 2:22 PM   Clinical Narrative:     Spoke with patient and her daughter, [name], on hospital room phone. PTA home with spouse. Has a raised a toilet seat and declined 3-N-1 at this time. Agreeable to [facility] PT. Choice of agency offered. Referral accepted by [facility]. PCP verified. Family to provide transportation home. No further TOC needs identified.   Final next level of care: Home w Home Health Services Barriers to Discharge: No Barriers Identified   Patient Goals and CMS Choice Patient states their goals for this hospitalization and ongoing recovery are:: return home CMS Medicare.gov Compare Post Acute Care list provided to:: Patient Choice offered to / list presented to : Patient,Adult Children  Discharge Placement                       Discharge Plan and Services                DME Arranged: N/A DME Agency: NA       HH Arranged: PT HH Agency: [facility] Date [facility] Agency Contacted: 03/25/21 Time HH Agency Contacted: 1422 Representative spoke with at [facility] Agency: Amy  Social Determinants of Health (SDOH) Interventions     Readmission Risk Interventions No flowsheet data found.

## 2021-03-25 NOTE — Progress Notes (Signed)
Reprinted discharge paper work per [facility] MD for updated med list for patient to stop taking Methocarbamol (Robaxin). Patient Alicia Spencer and spouse verbalized understanding.

## 2021-03-25 NOTE — Progress Notes (Signed)
Discharge teaching given to patient Alicia Spencer and patients spouse Alicia Spencer. Both verbalized understanding of discharge teaching.

## 2021-03-25 NOTE — Progress Notes (Signed)
ERCP findings noted. LFTs improving. No emergent surgical intervention indicated at this time. Will follow up once cytology and pathology are back.   Juliet Rude, [facility] 03/25/2021, 8:49 AM Please see Amion for pager number during day hours 7:00am-4:30pm

## 2021-03-25 NOTE — Progress Notes (Signed)
HISTORY OF PRESENT ILLNESS:  Alicia Spencer is a 80 y.o. female was admitted with biliary obstruction found incidentally after presenting to the emergency room with traumatic fall and fractured left rib.  Underwent ERCP yesterday and was found to have distal stricture.  See report.  Stent placed.  Liver test significantly improved.  She is feeling well.  Pleasant and moving about the room.  She has a Comptroller in the room.  She does take NSAIDs regularly at home.  REVIEW OF SYSTEMS:  All non-GI ROS negative.  History reviewed. No pertinent past medical history.  History reviewed. No pertinent surgical history.  Social History ZOYE CHANDRA  reports that she has been smoking cigarettes. She has been smoking about 0.00 packs per day for the past 15.00 years. She has never used smokeless tobacco. No history on file for alcohol use and drug use.  family history is not on file.  Allergies  Allergen Reactions  . Sulfites Anaphylaxis  . Iodine Other (See Comments)    welts       PHYSICAL EXAMINATION: Vital signs: BP (!) 147/96 (BP Location: Left Arm)   Pulse 79   Temp 99.3 F (37.4 C) (Oral)   Resp 16   Ht 5' (1.524 m)   Wt 54.4 kg   LMP  (LMP Unknown)   SpO2 97%   BMI 23.42 kg/m   Constitutional: Jaundiced but otherwise generally well-appearing, no acute distress Psychiatric: alert and oriented x3, cooperative Eyes: extraocular movements intact, anicteric, conjunctiva pink Mouth: oral pharynx moist, no lesions Neck: supple no lymphadenopathy Cardiovascular: heart regular rate and rhythm, no murmur Lungs: clear to auscultation bilaterally Abdomen: soft, nontender, nondistended, no obvious ascites, no peritoneal signs, normal bowel sounds, no organomegaly Rectal: Omitted Extremities: no clubbing, cyanosis, or lower extremity edema bilaterally Skin: no lesions on visible extremities Neuro: No focal deficits. No asterixis.    ASSESSMENT:  1.  Obstructive jaundice.  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  Biopsies brushings pending. 2.  Multiple duodenal ulcers   PLAN:  1.  Pantoprazole 40 mg twice daily until further notice 2.  Advance diet 3.  Follow-up biopsies/brushings.  We will do that. 4.  Outpatient GI follow-up in a few weeks.  My office will arrange patient may be discharged home today (from GI perspective).  She is aware.  I spoke to her daughter Rosey Bath [phone]) yesterday. Will sign off  Ravyn Nikkel N. Eda Keys., M.D. [facility] Division of Gastroenterology

## 2021-03-26 LAB — CANCER ANTIGEN 19-9: CA 19-9: 148 U/mL — ABNORMAL HIGH (ref 0–35)

## 2021-03-27 ENCOUNTER — Encounter (HOSPITAL_COMMUNITY): Payer: Self-pay | Admitting: Internal Medicine

## 2021-03-28 ENCOUNTER — Telehealth: Payer: Self-pay

## 2021-03-28 LAB — CULTURE, BLOOD (ROUTINE X 2)
Culture: NO GROWTH
Culture: NO GROWTH
Special Requests: ADEQUATE

## 2021-03-28 LAB — SURGICAL PATHOLOGY

## 2021-03-28 LAB — CYTOLOGY - NON PAP

## 2021-03-28 NOTE — Telephone Encounter (Signed)
Spoke with pts daughter and she is aware of recommendations. Pt scheduled to see Dr. Marina Goodell 04/12/21 at 8:20am. Daughter aware of appt.

## 2021-03-28 NOTE — Telephone Encounter (Signed)
-----   Message from [name], MD sent at 03/25/2021 12:24 PM EDT ----- Regarding: Needs follow-up Bonita Quin,  Inpatient that I did ERCP on who will be going home this weekend.  She needs the following:  1.  Make sure that she is taking pantoprazole 40 mg twice daily 2.  She has been taking NSAIDs at home.  I believe ibuprofen.  Please make sure she takes no NSAIDs.  She can take Tylenol for aches and pains. 3.  She needs office follow-up with me in a couple of weeks. 4.  Though pleasant, she has some memory issues.  He may want to talk with her husband or her daughter Rosey Bath [phone]) or both to make sure all of the above is happening.  Thank you,  JP

## 2021-03-29 ENCOUNTER — Other Ambulatory Visit: Payer: Self-pay | Admitting: Physician Assistant

## 2021-03-29 DIAGNOSIS — E042 Nontoxic multinodular goiter: Secondary | ICD-10-CM

## 2021-04-12 ENCOUNTER — Ambulatory Visit: Payer: Medicare Other | Admitting: Internal Medicine

## 2021-04-12 ENCOUNTER — Other Ambulatory Visit (INDEPENDENT_AMBULATORY_CARE_PROVIDER_SITE_OTHER): Payer: Medicare Other

## 2021-04-12 ENCOUNTER — Other Ambulatory Visit: Payer: Medicare Other

## 2021-04-12 ENCOUNTER — Encounter: Payer: Self-pay | Admitting: Internal Medicine

## 2021-04-12 VITALS — BP 144/68 | HR 77 | Ht 62.0 in | Wt 113.0 lb

## 2021-04-12 DIAGNOSIS — R935 Abnormal findings on diagnostic imaging of other abdominal regions, including retroperitoneum: Secondary | ICD-10-CM

## 2021-04-12 DIAGNOSIS — K219 Gastro-esophageal reflux disease without esophagitis: Secondary | ICD-10-CM | POA: Diagnosis not present

## 2021-04-12 DIAGNOSIS — K831 Obstruction of bile duct: Secondary | ICD-10-CM

## 2021-04-12 DIAGNOSIS — K269 Duodenal ulcer, unspecified as acute or chronic, without hemorrhage or perforation: Secondary | ICD-10-CM | POA: Diagnosis not present

## 2021-04-12 LAB — CBC WITH DIFFERENTIAL/PLATELET
Basophils Absolute: 0.1 10*3/uL (ref 0.0–0.1)
Basophils Relative: 1.3 % (ref 0.0–3.0)
Eosinophils Absolute: 0.3 10*3/uL (ref 0.0–0.7)
Eosinophils Relative: 5.3 % — ABNORMAL HIGH (ref 0.0–5.0)
HCT: 33.7 % — ABNORMAL LOW (ref 36.0–46.0)
Hemoglobin: 11.6 g/dL — ABNORMAL LOW (ref 12.0–15.0)
Lymphocytes Relative: 26.3 % (ref 12.0–46.0)
Lymphs Abs: 1.4 10*3/uL (ref 0.7–4.0)
MCHC: 34.3 g/dL (ref 30.0–36.0)
MCV: 96.7 fl (ref 78.0–100.0)
Monocytes Absolute: 0.4 10*3/uL (ref 0.1–1.0)
Monocytes Relative: 8.6 % (ref 3.0–12.0)
Neutro Abs: 3 10*3/uL (ref 1.4–7.7)
Neutrophils Relative %: 58.5 % (ref 43.0–77.0)
Platelets: 408 10*3/uL — ABNORMAL HIGH (ref 150.0–400.0)
RBC: 3.48 Mil/uL — ABNORMAL LOW (ref 3.87–5.11)
RDW: 14.6 % (ref 11.5–15.5)
WBC: 5.2 10*3/uL (ref 4.0–10.5)

## 2021-04-12 LAB — COMPREHENSIVE METABOLIC PANEL
ALT: 14 U/L (ref 0–35)
AST: 17 U/L (ref 0–37)
Albumin: 4.3 g/dL (ref 3.5–5.2)
Alkaline Phosphatase: 308 U/L — ABNORMAL HIGH (ref 39–117)
BUN: 13 mg/dL (ref 6–23)
CO2: 24 mEq/L (ref 19–32)
Calcium: 9.3 mg/dL (ref 8.4–10.5)
Chloride: 105 mEq/L (ref 96–112)
Creatinine, Ser: 0.62 mg/dL (ref 0.40–1.20)
GFR: 85.71 mL/min (ref >60.00)
Glucose, Bld: 106 mg/dL — ABNORMAL HIGH (ref 70–99)
Potassium: 3.8 mEq/L (ref 3.5–5.1)
Sodium: 140 mEq/L (ref 135–145)
Total Bilirubin: 2.1 mg/dL — ABNORMAL HIGH (ref 0.2–1.2)
Total Protein: 7.4 g/dL (ref 6.0–8.3)

## 2021-04-12 MED ORDER — PANTOPRAZOLE SODIUM 40 MG PO TBEC: 40.0000 mg | DELAYED_RELEASE_TABLET | Freq: Two times a day (BID) | ORAL | 6 refills | Status: DC

## 2021-04-12 NOTE — Patient Instructions (Signed)
If you are age 78 or older, your body mass index should be between 23-30. Your Body mass index is 20.67 kg/m. If this is out of the aforementioned range listed, please consider follow up with your Primary Care Provider.  If you are age 8 or younger, your body mass index should be between 19-25. Your Body mass index is 20.67 kg/m. If this is out of the aformentioned range listed, please consider follow up with your Primary Care Provider.   __________________________________________________________  The [facility] providers would like to encourage you to use [facility] to communicate with providers for non-urgent requests or questions.  Due to long hold times on the telephone, sending your provider a message by [facility] may be a faster and more efficient way to get a response.  Please allow 48 business hours for a response.  Please remember that this is for non-urgent requests.    Your provider has requested that you go to the basement level for lab work before leaving today. Press "B" on the elevator. The lab is located at the first door on the left as you exit the elevator.  Due to recent changes in healthcare laws, you may see the results of your imaging and laboratory studies on MyChart before your provider has had a chance to review them.  We understand that in some cases there may be results that are confusing or concerning to you. Not all laboratory results come back in the same time frame and the provider may be waiting for multiple results in order to interpret others.  Please give [place] 48 hours in order for your provider to thoroughly review all the results before contacting the office for clarification of your results.   We have sent the following medications to your pharmacy for you to pick up at your convenience: Pantoprazole   Thank you for choosing me and [facility].  Dr. Yancey Flemings

## 2021-04-12 NOTE — Progress Notes (Signed)
**Note Alicia-Identified via Obfuscation** HISTORY OF PRESENT ILLNESS:  Alicia Spencer is a 80 y.o. female with past medical history listed below who presents today for posthospital follow-up regarding management of biliary obstruction.  She presented to the hospital March 22, 2021 after sustaining a fall.  She had pain in the region of the left rib cage.  X-rays were negative for rib fracture.  However, she was noted to be jaundiced.  Liver tests were performed and found to be markedly abnormal with AST 481, ALT 396, alkaline phosphatase 1079, total bilirubin 15.8.  CT scan and MRI/MRCP were performed March 22, 2021 in March 23, 2021, respectively.  The CT revealed marked dilation of the biliary system gallbladder.  Incidental T5 compression fracture noted.  MRI/MRCP revealed similar findings with abrupt cut off of the distal common duct.  However, there was no evidence for discrete mass or stone.  The pancreas was unremarkable.  CA 19-9 was mildly elevated at 148.  She subsequently underwent ERCP.  She was found to have multiple ulcers in the duodenal bulb as well as the second duodenum including a large ulcer that was involving the major papilla.  Biopsies were obtained and returned benign.  The common bile duct was diffusely dilated.  There was a 12 mm distal stricture.  This was brushed.  Cytology returned negative for malignancy.  A 10 French 5 cm plastic biliary stent was placed.  She was placed on pantoprazole 40 mg twice daily.  She has been taking NSAIDs and was instructed to stop.  She was discharged home the following day.  She is accompanied today by her husband.  She states that she is feeling well.  They both report resolution of her jaundice.  No abdominal pain.  No pain related to her fall.  She does seem to have some memory difficulties.  Seems that she has had some weight loss over the past 6 months, though this has not been quantified.  I did speak with her daughter by telephone at the time of her procedure.  She is accompanied today by her  husband [name].  She did have follow-up with her primary care team [name]) March 29, 2021.  Blood work at that time showed improved liver test with AST 45, ALT 102, alkaline phosphatase 742, total bilirubin 4.8  REVIEW OF SYSTEMS:  All non-GI ROS negative unless otherwise stated in the HPI.  Past Medical History:  Diagnosis Date   Biliary obstruction 03/2021    Past Surgical History:  Procedure Laterality Date   BILIARY BRUSHING  03/24/2021   Procedure: BILIARY BRUSHING;  Surgeon: [name], MD;  Location[facility] ENDOSCOPY;  Service: Endoscopy;;   BILIARY STENT PLACEMENT  03/24/2021   Procedure: BILIARY STENT PLACEMENT;  Surgeon: [name], MD;  Location: [facility] ENDOSCOPY;  Service: Endoscopy;;   BIOPSY  03/24/2021   Procedure: BIOPSY;  Surgeon: [name], MD;  Location: [facility] ENDOSCOPY;  Service: Endoscopy;;   ENDOSCOPIC RETROGRADE CHOLANGIOPANCREATOGRAPHY (ERCP) WITH PROPOFOL N/A 03/24/2021   Procedure: ENDOSCOPIC RETROGRADE CHOLANGIOPANCREATOGRAPHY (ERCP) WITH PROPOFOL;  Surgeon: [name], MD;  Location: [facility] ENDOSCOPY;  Service: Endoscopy;  Laterality: N/A;   SPHINCTEROTOMY  03/24/2021   Procedure: SPHINCTEROTOMY;  Surgeon: [name], MD;  Location: [facility] ENDOSCOPY;  Service: Endoscopy;;    Social History Laverta [place]  reports that she has been smoking cigarettes. She has never used smokeless tobacco. No history on file for alcohol use and drug use.  family history includes Cancer in her mother; Cancer -  Other in her father.  Allergies  Allergen Reactions   Sulfites Anaphylaxis   Iodine Other (See Comments)    welts       PHYSICAL EXAMINATION: Vital signs: BP (!) 144/68   Pulse 77   Ht 5\' 2"  (1.575 m)   Wt 113 lb (51.3 kg)   LMP  (LMP Unknown)   SpO2 98%   BMI 20.67 kg/m   Constitutional: Thin but generally well-appearing, no acute distress Psychiatric: Pleasant, alert and oriented x3, cooperative Eyes: extraocular movements intact, anicteric, conjunctiva  pink Mouth: oral pharynx moist, no lesions Neck: supple no lymphadenopathy Cardiovascular: heart regular rate and rhythm, no murmur Lungs: clear to auscultation bilaterally Abdomen: soft, nontender, nondistended, no obvious ascites, no peritoneal signs, normal bowel sounds, no organomegaly Rectal: Omitted Extremities: no clubbing, cyanosis, or lower extremity edema bilaterally Skin: no lesions on visible extremities Neuro: No focal deficits.  Cranial nerves intact  ASSESSMENT:  1.  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  No evidence for pancreatic cancer by imaging.  Mild elevation of CA 19-9 (significance uncertain in the face of biliary obstruction) noted.  Possible etiologies include malignant lesion of the distal bile duct, benign lesion of the distal bile duct, or biliary obstruction secondary to ampullary edema from significant ulceration. 2.  Duodenal ulceration secondary to NSAIDs  PLAN:  1.  Comprehensive metabolic panel and CBC today 2.  Continue pantoprazole 40 mg twice daily.  Prescription refilled 3.  Continue to avoid NSAIDs 4.  Schedule EUS/ERCP with my colleague Dr. .  May need FNA.  May need spyglass with biopsies.  May need stent exchange.  I discussed this with the patient and her husband in great detail.  I have also discussed this case previously with Dr. Meridee Score. 5.  I discussed with them signs and symptoms of biliary stent obstruction or migration such as recurrent jaundice, pain, and cholangitis with or without confusion.  They understand to contact this office for any questions.  They also understand to proceed immediately to the emergency room for any acute illness.  A total time of 50 minutes was spent preparing to see this patient, reviewing test, reviewing outside office evaluation, obtaining comprehensive history, performing medically appropriate physical  examination, educating the patient and her husband regarding her above listed problems, ordering medications, laboratories, and advanced procedures.  Finally, documenting clinical information in the health record

## 2021-04-17 ENCOUNTER — Other Ambulatory Visit: Payer: Self-pay

## 2021-04-17 ENCOUNTER — Telehealth: Payer: Self-pay

## 2021-04-17 DIAGNOSIS — K269 Duodenal ulcer, unspecified as acute or chronic, without hemorrhage or perforation: Secondary | ICD-10-CM

## 2021-04-17 DIAGNOSIS — K831 Obstruction of bile duct: Secondary | ICD-10-CM

## 2021-04-17 NOTE — Telephone Encounter (Signed)
-----   Message from Lemar Lofty., MD sent at 04/13/2021 11:39 AM EDT ----- Regarding: Scheduling JP, Thanks for forwarding me this information.  Agree neck steps are EUS/ERCP with spyglass availability. Malayja Freund or Bonita Quin, please move forward with scheduling this patient with me for an EUS/ERCP on the week of July 18 or the week of August 22.  Patient can be set up on my normal yellow blocks or as a 1230 case purple block. Please update Dr. Marina Goodell and I went patient has been scheduled. Thanks. GM ----- Message ----- From: [name], MD Sent: 04/12/2021   9:45 AM EDT To: Lemar Lofty., MD  Liz [place], We previously discussed this patient with indeterminate distal biliary stricture status post ERC with brushings, duodenal ulcer biopsy, and stent placement.  I believe the neck step is EUS/ERCP/possible spyglass.  I appreciate your getting her scheduled.  Please see my note.  Thanks

## 2021-04-17 NOTE — Telephone Encounter (Signed)
EUS ERCP Spyglass scheduled for 7/18 at 730 am at [facility] with GM  Instructions mailed to the pt Left message on machine to call back

## 2021-04-18 NOTE — Telephone Encounter (Signed)
6/28 no answer no voice mail

## 2021-04-18 NOTE — Telephone Encounter (Signed)
Line rings no voice mail. All information mailed.

## 2021-04-19 NOTE — Telephone Encounter (Signed)
EUSERCP scheduled, pt instructed and medications reviewed.  Patient instructions mailed to home.  Patient to call with any questions or concerns.  

## 2021-04-19 NOTE — Telephone Encounter (Signed)
Line rings busy  

## 2021-04-27 ENCOUNTER — Other Ambulatory Visit: Payer: Self-pay

## 2021-04-27 ENCOUNTER — Encounter (HOSPITAL_COMMUNITY): Payer: Self-pay

## 2021-04-27 ENCOUNTER — Emergency Department (HOSPITAL_COMMUNITY): Payer: Medicare Other

## 2021-04-27 ENCOUNTER — Inpatient Hospital Stay (HOSPITAL_COMMUNITY)
Admission: EM | Admit: 2021-04-27 | Discharge: 2021-04-30 | DRG: 444 | Disposition: A | Discharge status: Home / Self Care | Payer: Medicare Other | Attending: Internal Medicine | Admitting: Internal Medicine

## 2021-04-27 DIAGNOSIS — R1013 Epigastric pain: Secondary | ICD-10-CM

## 2021-04-27 DIAGNOSIS — K8309 Other cholangitis: Secondary | ICD-10-CM | POA: Diagnosis present

## 2021-04-27 DIAGNOSIS — B961 Klebsiella pneumoniae [K. pneumoniae] as the cause of diseases classified elsewhere: Secondary | ICD-10-CM | POA: Diagnosis present

## 2021-04-27 DIAGNOSIS — K831 Obstruction of bile duct: Principal | ICD-10-CM | POA: Diagnosis present

## 2021-04-27 DIAGNOSIS — Z888 Allergy status to other drugs, medicaments and biological substances status: Secondary | ICD-10-CM

## 2021-04-27 DIAGNOSIS — F17210 Nicotine dependence, cigarettes, uncomplicated: Secondary | ICD-10-CM | POA: Diagnosis present

## 2021-04-27 DIAGNOSIS — Z882 Allergy status to sulfonamides status: Secondary | ICD-10-CM

## 2021-04-27 DIAGNOSIS — R7989 Other specified abnormal findings of blood chemistry: Secondary | ICD-10-CM | POA: Diagnosis present

## 2021-04-27 DIAGNOSIS — R17 Unspecified jaundice: Secondary | ICD-10-CM | POA: Diagnosis present

## 2021-04-27 DIAGNOSIS — T85590A Other mechanical complication of bile duct prosthesis, initial encounter: Secondary | ICD-10-CM | POA: Diagnosis present

## 2021-04-27 DIAGNOSIS — E876 Hypokalemia: Secondary | ICD-10-CM | POA: Diagnosis present

## 2021-04-27 DIAGNOSIS — Z20822 Contact with and (suspected) exposure to covid-19: Secondary | ICD-10-CM | POA: Diagnosis present

## 2021-04-27 DIAGNOSIS — F0390 Unspecified dementia without behavioral disturbance: Secondary | ICD-10-CM | POA: Diagnosis present

## 2021-04-27 DIAGNOSIS — Z8711 Personal history of peptic ulcer disease: Secondary | ICD-10-CM

## 2021-04-27 DIAGNOSIS — Y732 Prosthetic and other implants, materials and accessory gastroenterology and urology devices associated with adverse incidents: Secondary | ICD-10-CM | POA: Diagnosis present

## 2021-04-27 DIAGNOSIS — K269 Duodenal ulcer, unspecified as acute or chronic, without hemorrhage or perforation: Secondary | ICD-10-CM

## 2021-04-27 DIAGNOSIS — Z9181 History of falling: Secondary | ICD-10-CM

## 2021-04-27 DIAGNOSIS — G9341 Metabolic encephalopathy: Secondary | ICD-10-CM | POA: Diagnosis present

## 2021-04-27 DIAGNOSIS — K828 Other specified diseases of gallbladder: Secondary | ICD-10-CM | POA: Diagnosis present

## 2021-04-27 DIAGNOSIS — Z79899 Other long term (current) drug therapy: Secondary | ICD-10-CM

## 2021-04-27 DIAGNOSIS — R7881 Bacteremia: Secondary | ICD-10-CM | POA: Diagnosis present

## 2021-04-27 LAB — RESP PANEL BY RT-PCR (FLU A&B, COVID) ARPGX2
Influenza A by PCR: NEGATIVE
Influenza B by PCR: NEGATIVE
SARS Coronavirus 2 by RT PCR: NEGATIVE

## 2021-04-27 LAB — COMPREHENSIVE METABOLIC PANEL
ALT: 105 U/L — ABNORMAL HIGH (ref 0–44)
AST: 114 U/L — ABNORMAL HIGH (ref 15–41)
Albumin: 3.6 g/dL (ref 3.5–5.0)
Alkaline Phosphatase: 540 U/L — ABNORMAL HIGH (ref 38–126)
Anion gap: 13 (ref 5–15)
BUN: 11 mg/dL (ref 8–23)
CO2: 25 mmol/L (ref 22–32)
Calcium: 10.1 mg/dL (ref 8.9–10.3)
Chloride: 103 mmol/L (ref 98–111)
Creatinine, Ser: 0.42 mg/dL — ABNORMAL LOW (ref 0.44–1.00)
GFR, Estimated: >60 mL/min (ref >60)
Glucose, Bld: 189 mg/dL — ABNORMAL HIGH (ref 70–99)
Potassium: 2.7 mmol/L — CL (ref 3.5–5.1)
Sodium: 141 mmol/L (ref 135–145)
Total Bilirubin: 4.5 mg/dL — ABNORMAL HIGH (ref 0.3–1.2)
Total Protein: 7.4 g/dL (ref 6.5–8.1)

## 2021-04-27 LAB — CBC WITH DIFFERENTIAL/PLATELET
Abs Immature Granulocytes: 0.07 10*3/uL (ref 0.00–0.07)
Basophils Absolute: 0.1 10*3/uL (ref 0.0–0.1)
Basophils Relative: 0 %
Eosinophils Absolute: 0 10*3/uL (ref 0.0–0.5)
Eosinophils Relative: 0 %
HCT: 34.1 % — ABNORMAL LOW (ref 36.0–46.0)
Hemoglobin: 11.4 g/dL — ABNORMAL LOW (ref 12.0–15.0)
Immature Granulocytes: 1 %
Lymphocytes Relative: 3 %
Lymphs Abs: 0.5 10*3/uL — ABNORMAL LOW (ref 0.7–4.0)
MCH: 32.7 pg (ref 26.0–34.0)
MCHC: 33.4 g/dL (ref 30.0–36.0)
MCV: 97.7 fL (ref 80.0–100.0)
Monocytes Absolute: 0.6 10*3/uL (ref 0.1–1.0)
Monocytes Relative: 4 %
Neutro Abs: 14.2 10*3/uL — ABNORMAL HIGH (ref 1.7–7.7)
Neutrophils Relative %: 92 %
Platelets: 485 10*3/uL — ABNORMAL HIGH (ref 150–400)
RBC: 3.49 MIL/uL — ABNORMAL LOW (ref 3.87–5.11)
RDW: 13.9 % (ref 11.5–15.5)
WBC: 15.3 10*3/uL — ABNORMAL HIGH (ref 4.0–10.5)
nRBC: 0 % (ref 0.0–0.2)

## 2021-04-27 LAB — MAGNESIUM: Magnesium: 1.5 mg/dL — ABNORMAL LOW (ref 1.7–2.4)

## 2021-04-27 LAB — LIPASE, BLOOD: Lipase: 22 U/L (ref 11–51)

## 2021-04-27 MED ORDER — MAGNESIUM SULFATE 2 GM/50ML IV SOLN
2.0000 g | Freq: Once | INTRAVENOUS | Status: AC
  Administered 2021-04-27: 2 g via INTRAVENOUS
  Filled 2021-04-27: qty 50

## 2021-04-27 MED ORDER — POTASSIUM CHLORIDE 10 MEQ/100ML IV SOLN
10.0000 meq | Freq: Once | INTRAVENOUS | Status: AC
  Administered 2021-04-27: 10 meq via INTRAVENOUS
  Filled 2021-04-27: qty 100

## 2021-04-27 MED ORDER — SODIUM CHLORIDE (PF) 0.9 % IJ SOLN
INTRAMUSCULAR | Status: AC
  Filled 2021-04-27: qty 50

## 2021-04-27 MED ORDER — MORPHINE SULFATE (PF) 4 MG/ML IV SOLN
4.0000 mg | Freq: Once | INTRAVENOUS | Status: AC
  Administered 2021-04-27: 4 mg via INTRAVENOUS
  Filled 2021-04-27: qty 1

## 2021-04-27 MED ORDER — SODIUM CHLORIDE 0.9 % IV BOLUS
1000.0000 mL | Freq: Once | INTRAVENOUS | Status: AC
Start: 2021-04-27 — End: 2021-04-27
  Administered 2021-04-27: 1000 mL via INTRAVENOUS

## 2021-04-27 MED ORDER — IOHEXOL 350 MG/ML SOLN
80.0000 mL | Freq: Once | INTRAVENOUS | Status: AC | PRN
  Administered 2021-04-27: 80 mL via INTRAVENOUS

## 2021-04-27 MED ORDER — ONDANSETRON HCL 4 MG/2ML IJ SOLN
4.0000 mg | Freq: Once | INTRAMUSCULAR | Status: AC
  Administered 2021-04-27: 4 mg via INTRAVENOUS
  Filled 2021-04-27: qty 2

## 2021-04-27 NOTE — ED Provider Notes (Signed)
[facility]-EMERGENCY DEPT Provider Note   CSN: [id number] Arrival date & time: 04/27/21  1921     History Chief Complaint  Patient presents with   Abdominal Pain    Alicia Spencer is a 80 y.o. female.  Pt presents to the ED today with epigastric abdominal pain.  The pt has a hx of a biliary obstruction.  She had a biliary duct stent placed on 03/24/21 by Dr. Marina Goodell Corinda Gubler).  She also had multiple ulcers in the duodenum.  She was put on pantoprazole.  She did f/u with GI on 6/22 and is scheduled for a repeat endoscopy on 7/18 with Dr. Meridee Score.  Pt had been doing well until she developed epigastric pain again about 2 days ago.  She denies any other associated sx.      Past Medical History:  Diagnosis Date   Biliary obstruction 03/2021    Patient Active Problem List   Diagnosis Date Noted   Bile duct stricture    Biliary obstruction    Duodenal ulcer    Acute cholecystitis 03/22/2021    Past Surgical History:  Procedure Laterality Date   BILIARY BRUSHING  03/24/2021   Procedure: BILIARY BRUSHING;  Surgeon: [name], MD;  Location: [facility] ENDOSCOPY;  Service: Endoscopy;;   BILIARY STENT PLACEMENT  03/24/2021   Procedure: BILIARY STENT PLACEMENT;  Surgeon: [name], MD;  Location: [facility] ENDOSCOPY;  Service: Endoscopy;;   BIOPSY  03/24/2021   Procedure: BIOPSY;  Surgeon: [name], MD;  Location: [facility] ENDOSCOPY;  Service: Endoscopy;;   ENDOSCOPIC RETROGRADE CHOLANGIOPANCREATOGRAPHY (ERCP) WITH PROPOFOL N/A 03/24/2021   Procedure: ENDOSCOPIC RETROGRADE CHOLANGIOPANCREATOGRAPHY (ERCP) WITH PROPOFOL;  Surgeon: [name], MD;  Location: [facility] ENDOSCOPY;  Service: Endoscopy;  Laterality: N/A;   SPHINCTEROTOMY  03/24/2021   Procedure: SPHINCTEROTOMY;  Surgeon: [name], MD;  Location: [facility] ENDOSCOPY;  Service: Endoscopy;;     OB History   No obstetric history on file.     Family History  Problem Relation Age of Onset   Cancer Mother        kidney?    Cancer - Other Father    Colon cancer Neg Hx    Esophageal cancer Neg Hx    Stomach cancer Neg Hx    Pancreatic cancer Neg Hx     Social History   Tobacco Use   Smoking status: Every Day    Packs/day: 0.00    Years: 15.00    Pack years: 0.00    Types: Cigarettes   Smokeless tobacco: Never   Tobacco comments:    only smokes 2 cigarettes/day  Vaping Use   Vaping Use: Never used    Home Medications Prior to Admission medications   Medication Sig Start Date End Date Taking? Authorizing Provider  acetaminophen (TYLENOL) 325 MG tablet Take 1 tablet (325 mg total) by mouth every 6 (six) hours as needed for mild pain or moderate pain. 03/25/21 03/25/22  Hongalgi, Maximino Greenland, MD  pantoprazole (PROTONIX) 40 MG tablet Take 1 tablet (40 mg total) by mouth 2 (two) times daily. 04/12/21   [name], MD    Allergies    Sulfites and Iodine  Review of Systems   Review of Systems  Gastrointestinal:  Positive for abdominal pain.  All other systems reviewed and are negative.  Physical Exam Updated Vital Signs BP (!) 163/84   Pulse 86   Temp 99.3 F (37.4 C) (Oral)   Resp 18   LMP  (  LMP Unknown)   SpO2 92%   Physical Exam Vitals and nursing note reviewed.  Constitutional:      Appearance: She is well-developed.  HENT:     Head: Normocephalic and atraumatic.     Mouth/Throat:     Mouth: Mucous membranes are moist.     Pharynx: Oropharynx is clear.  Eyes:     Extraocular Movements: Extraocular movements intact.     Pupils: Pupils are equal, round, and reactive to light.  Cardiovascular:     Rate and Rhythm: Normal rate and regular rhythm.  Abdominal:     General: Abdomen is flat. Bowel sounds are normal.     Palpations: Abdomen is soft.     Tenderness: There is abdominal tenderness in the epigastric area.  Skin:    General: Skin is warm.     Capillary Refill: Capillary refill takes less than 2 seconds.  Neurological:     General: No focal deficit present.     Mental Status:  She is alert and oriented to person, place, and time.  Psychiatric:        Mood and Affect: Mood normal.        Behavior: Behavior normal.    ED Results / Procedures / Treatments   Labs (all labs ordered are listed, but only abnormal results are displayed) Labs Reviewed  CBC WITH DIFFERENTIAL/PLATELET - Abnormal; Notable for the following components:      Result Value   WBC 15.3 (*)    RBC 3.49 (*)    Hemoglobin 11.4 (*)    HCT 34.1 (*)    Platelets 485 (*)    Neutro Abs 14.2 (*)    Lymphs Abs 0.5 (*)    All other components within normal limits  COMPREHENSIVE METABOLIC PANEL - Abnormal; Notable for the following components:   Potassium 2.7 (*)    Glucose, Bld 189 (*)    Creatinine, Ser 0.42 (*)    AST 114 (*)    ALT 105 (*)    Alkaline Phosphatase 540 (*)    Total Bilirubin 4.5 (*)    All other components within normal limits  MAGNESIUM - Abnormal; Notable for the following components:   Magnesium 1.5 (*)    All other components within normal limits  RESP PANEL BY RT-PCR (FLU A&B, COVID) ARPGX2  LIPASE, BLOOD  URINALYSIS, ROUTINE W REFLEX MICROSCOPIC    EKG None  Radiology CT ABDOMEN PELVIS W CONTRAST  Result Date: 04/27/2021 CLINICAL DATA:  Epigastric pain, elevated liver function studies, and bilirubin. Recent biliary stent. Pain for 2 days. EXAM: CT ABDOMEN AND PELVIS WITH CONTRAST TECHNIQUE: Multidetector CT imaging of the abdomen and pelvis was performed using the standard protocol following bolus administration of intravenous contrast. CONTRAST:  77mL OMNIPAQUE IOHEXOL 350 MG/ML SOLN COMPARISON:  CT 03/22/2021.  MRI 03/23/2021 FINDINGS: Lower chest: Mild dependent atelectasis in the lung bases. Hepatobiliary: Diffusely distended gallbladder with mild pericholecystic edema. Prominent intra and extrahepatic bile duct dilatation. A distal biliary stent is present. Persistence of bile duct dilatation despite stent placement suggest possible stent malfunction. No definite  mass lesion. Pancreas: Pancreatic atrophy.  No ductal dilatation or inflammation. Spleen: Normal in size without focal abnormality. Adrenals/Urinary Tract: Adrenal glands are unremarkable. Kidneys are normal, without renal calculi, focal lesion, or hydronephrosis. Bladder is unremarkable. Stomach/Bowel: Stomach, small bowel, and colon are not abnormally distended. No wall thickening or inflammatory changes. Appendix is not identified. Vascular/Lymphatic: Aortic atherosclerosis. No enlarged abdominal or pelvic lymph nodes. Reproductive: Uterine calcification consistent  with fibroid. No abnormal adnexal masses. Other: No free air or free fluid in the abdomen. Abdominal wall musculature appears intact. Musculoskeletal: Spondylolysis with mild spondylolisthesis at L4-5. Mild degenerative changes in the spine. Postoperative fixation of the right hip with degenerative change in the right hip. IMPRESSION: 1. Persistent intra and extrahepatic bile duct dilatation and gallbladder distension with a biliary stent present. Persistence of bile duct dilatation suggest possible stent malfunction. No mass or stone identified. 2. Pancreatic atrophy. No inflammatory changes or ductal dilatation demonstrated. No focal mass lesions identified. 3. Aortic atherosclerosis. 4. Calcified uterine fibroids. Electronically Signed   By: Burman Nieves M.D.   On: 04/27/2021 23:09    Procedures Procedures   Medications Ordered in ED Medications  magnesium sulfate IVPB 2 g 50 mL (2 g Intravenous New Bag/Given 04/27/21 2251)  sodium chloride (PF) 0.9 % injection (has no administration in time range)  sodium chloride 0.9 % bolus 1,000 mL (1,000 mLs Intravenous Bolus from Bag 04/27/21 2033)  morphine 4 MG/ML injection 4 mg (4 mg Intravenous Given 04/27/21 2026)  ondansetron (ZOFRAN) injection 4 mg (4 mg Intravenous Given 04/27/21 2026)  morphine 4 MG/ML injection 4 mg (4 mg Intravenous Given 04/27/21 2214)  potassium chloride 10 mEq in 100 mL  IVPB (10 mEq Intravenous New Bag/Given 04/27/21 2220)  iohexol (OMNIPAQUE) 350 MG/ML injection 80 mL (80 mLs Intravenous Contrast Given 04/27/21 2255)    ED Course  I have reviewed the triage vital signs and the nursing notes.  Pertinent labs & imaging results that were available during my care of the patient were reviewed by me and considered in my medical decision making (see chart for details).    MDM Rules/Calculators/A&P                          K and Mg are low.  They are replaced.  Pt's LFTs are climbing back up again.  Her bilirubin went as low as 2.1 on 6/22.  Now it is up to 4.5.  CT abd/pelvis does show biliary duct dilation.  ? Stent malfunction.  I sent a secure chat to Dr. Leone Payor ([facility]) and put [facility] on the tx team list.  I don't think there is anything they need to do tonight.  Pt d/w Dr. Loney Loh ([facility]) for admission.   Final Clinical Impression(s) / ED Diagnoses Final diagnoses:  Epigastric pain  Hypomagnesemia  Hypokalemia  Elevated LFTs  Obstruction of biliary stent, initial encounter    Rx / DC Orders ED Discharge Orders     None        Jacalyn Lefevre, MD 04/27/21 2355

## 2021-04-27 NOTE — ED Triage Notes (Addendum)
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

## 2021-04-27 NOTE — ED Notes (Signed)
Pt transported to CT ?

## 2021-04-28 DIAGNOSIS — R17 Unspecified jaundice: Secondary | ICD-10-CM | POA: Diagnosis present

## 2021-04-28 DIAGNOSIS — K828 Other specified diseases of gallbladder: Secondary | ICD-10-CM | POA: Diagnosis present

## 2021-04-28 DIAGNOSIS — Y732 Prosthetic and other implants, materials and accessory gastroenterology and urology devices associated with adverse incidents: Secondary | ICD-10-CM | POA: Diagnosis present

## 2021-04-28 DIAGNOSIS — Z8711 Personal history of peptic ulcer disease: Secondary | ICD-10-CM | POA: Diagnosis not present

## 2021-04-28 DIAGNOSIS — K831 Obstruction of bile duct: Secondary | ICD-10-CM | POA: Diagnosis present

## 2021-04-28 DIAGNOSIS — R7881 Bacteremia: Secondary | ICD-10-CM | POA: Diagnosis present

## 2021-04-28 DIAGNOSIS — K269 Duodenal ulcer, unspecified as acute or chronic, without hemorrhage or perforation: Secondary | ICD-10-CM | POA: Diagnosis present

## 2021-04-28 DIAGNOSIS — E876 Hypokalemia: Secondary | ICD-10-CM | POA: Diagnosis present

## 2021-04-28 DIAGNOSIS — F0390 Unspecified dementia without behavioral disturbance: Secondary | ICD-10-CM | POA: Diagnosis present

## 2021-04-28 DIAGNOSIS — Z9181 History of falling: Secondary | ICD-10-CM | POA: Diagnosis not present

## 2021-04-28 DIAGNOSIS — G9341 Metabolic encephalopathy: Secondary | ICD-10-CM | POA: Diagnosis present

## 2021-04-28 DIAGNOSIS — B961 Klebsiella pneumoniae [K. pneumoniae] as the cause of diseases classified elsewhere: Secondary | ICD-10-CM | POA: Diagnosis present

## 2021-04-28 DIAGNOSIS — T85590A Other mechanical complication of bile duct prosthesis, initial encounter: Secondary | ICD-10-CM | POA: Diagnosis present

## 2021-04-28 DIAGNOSIS — R1013 Epigastric pain: Secondary | ICD-10-CM | POA: Diagnosis present

## 2021-04-28 DIAGNOSIS — Z882 Allergy status to sulfonamides status: Secondary | ICD-10-CM | POA: Diagnosis not present

## 2021-04-28 DIAGNOSIS — R7989 Other specified abnormal findings of blood chemistry: Secondary | ICD-10-CM | POA: Diagnosis present

## 2021-04-28 DIAGNOSIS — Z79899 Other long term (current) drug therapy: Secondary | ICD-10-CM | POA: Diagnosis not present

## 2021-04-28 DIAGNOSIS — Z888 Allergy status to other drugs, medicaments and biological substances status: Secondary | ICD-10-CM | POA: Diagnosis not present

## 2021-04-28 DIAGNOSIS — R1011 Right upper quadrant pain: Secondary | ICD-10-CM

## 2021-04-28 DIAGNOSIS — Z20822 Contact with and (suspected) exposure to covid-19: Secondary | ICD-10-CM | POA: Diagnosis present

## 2021-04-28 DIAGNOSIS — K8309 Other cholangitis: Secondary | ICD-10-CM | POA: Diagnosis present

## 2021-04-28 DIAGNOSIS — F17210 Nicotine dependence, cigarettes, uncomplicated: Secondary | ICD-10-CM | POA: Diagnosis present

## 2021-04-28 LAB — COMPREHENSIVE METABOLIC PANEL
ALT: 97 U/L — ABNORMAL HIGH (ref 0–44)
ALT: 77 U/L — ABNORMAL HIGH (ref 0–44)
AST: 88 U/L — ABNORMAL HIGH (ref 15–41)
AST: 59 U/L — ABNORMAL HIGH (ref 15–41)
Albumin: 3.4 g/dL — ABNORMAL LOW (ref 3.5–5.0)
Albumin: 2.8 g/dL — ABNORMAL LOW (ref 3.5–5.0)
Alkaline Phosphatase: 516 U/L — ABNORMAL HIGH (ref 38–126)
Alkaline Phosphatase: 433 U/L — ABNORMAL HIGH (ref 38–126)
Anion gap: 10 (ref 5–15)
Anion gap: 6 (ref 5–15)
BUN: 10 mg/dL (ref 8–23)
BUN: 10 mg/dL (ref 8–23)
CO2: 26 mmol/L (ref 22–32)
CO2: 23 mmol/L (ref 22–32)
Calcium: 9.6 mg/dL (ref 8.9–10.3)
Calcium: 8.7 mg/dL — ABNORMAL LOW (ref 8.9–10.3)
Chloride: 106 mmol/L (ref 98–111)
Chloride: 111 mmol/L (ref 98–111)
Creatinine, Ser: 0.51 mg/dL (ref 0.44–1.00)
Creatinine, Ser: 0.43 mg/dL — ABNORMAL LOW (ref 0.44–1.00)
GFR, Estimated: >60 mL/min (ref >60)
GFR, Estimated: >60 mL/min (ref >60)
Glucose, Bld: 144 mg/dL — ABNORMAL HIGH (ref 70–99)
Glucose, Bld: 129 mg/dL — ABNORMAL HIGH (ref 70–99)
Potassium: 3.3 mmol/L — ABNORMAL LOW (ref 3.5–5.1)
Potassium: 3.7 mmol/L (ref 3.5–5.1)
Sodium: 142 mmol/L (ref 135–145)
Sodium: 140 mmol/L (ref 135–145)
Total Bilirubin: 5.3 mg/dL — ABNORMAL HIGH (ref 0.3–1.2)
Total Bilirubin: 5 mg/dL — ABNORMAL HIGH (ref 0.3–1.2)
Total Protein: 7.2 g/dL (ref 6.5–8.1)
Total Protein: 6.1 g/dL — ABNORMAL LOW (ref 6.5–8.1)

## 2021-04-28 LAB — CBC
HCT: 34.4 % — ABNORMAL LOW (ref 36.0–46.0)
Hemoglobin: 11.2 g/dL — ABNORMAL LOW (ref 12.0–15.0)
MCH: 32.7 pg (ref 26.0–34.0)
MCHC: 32.6 g/dL (ref 30.0–36.0)
MCV: 100.6 fL — ABNORMAL HIGH (ref 80.0–100.0)
Platelets: 415 10*3/uL — ABNORMAL HIGH (ref 150–400)
RBC: 3.42 MIL/uL — ABNORMAL LOW (ref 3.87–5.11)
RDW: 14.1 % (ref 11.5–15.5)
WBC: 19.7 10*3/uL — ABNORMAL HIGH (ref 4.0–10.5)
nRBC: 0 % (ref 0.0–0.2)

## 2021-04-28 LAB — MAGNESIUM: Magnesium: 2.1 mg/dL (ref 1.7–2.4)

## 2021-04-28 LAB — LACTIC ACID, PLASMA: Lactic Acid, Venous: 1 mmol/L (ref 0.5–1.9)

## 2021-04-28 LAB — AMMONIA: Ammonia: 41 umol/L — ABNORMAL HIGH (ref 9–35)

## 2021-04-28 MED ORDER — SODIUM CHLORIDE 0.9 % IV SOLN: INTRAVENOUS | Status: AC

## 2021-04-28 MED ORDER — INDOMETHACIN 50 MG RE SUPP
100.0000 mg | Freq: Once | RECTAL | Status: DC
  Filled 2021-04-28: qty 2

## 2021-04-28 MED ORDER — POTASSIUM CHLORIDE CRYS ER 20 MEQ PO TBCR
40.0000 meq | EXTENDED_RELEASE_TABLET | Freq: Two times a day (BID) | ORAL | Status: AC
  Administered 2021-04-28 – 2021-04-29 (×4): 40 meq via ORAL
  Filled 2021-04-28 (×4): qty 2

## 2021-04-28 MED ORDER — POTASSIUM CHLORIDE CRYS ER 20 MEQ PO TBCR: 40.0000 meq | EXTENDED_RELEASE_TABLET | Freq: Once | ORAL | Status: DC

## 2021-04-28 MED ORDER — MORPHINE SULFATE (PF) 2 MG/ML IV SOLN: 1.0000 mg | INTRAVENOUS | Status: DC | PRN

## 2021-04-28 MED ORDER — APREPITANT 40 MG PO CAPS: 40.0000 mg | ORAL_CAPSULE | Freq: Once | ORAL | Status: DC

## 2021-04-28 MED ORDER — SODIUM CHLORIDE 0.9 % IV SOLN
2.0000 g | INTRAVENOUS | Status: DC
  Administered 2021-04-28: 2 g via INTRAVENOUS
  Filled 2021-04-28 (×2): qty 20

## 2021-04-28 MED ORDER — PIPERACILLIN-TAZOBACTAM 3.375 G IVPB
3.3750 g | Freq: Three times a day (TID) | INTRAVENOUS | Status: DC
  Administered 2021-04-28 – 2021-04-30 (×6): 3.375 g via INTRAVENOUS
  Filled 2021-04-28 (×6): qty 50

## 2021-04-28 MED ORDER — METRONIDAZOLE 500 MG/100ML IV SOLN
500.0000 mg | Freq: Three times a day (TID) | INTRAVENOUS | Status: DC
  Administered 2021-04-28: 500 mg via INTRAVENOUS
  Filled 2021-04-28: qty 100

## 2021-04-28 MED ORDER — POTASSIUM CHLORIDE 10 MEQ/100ML IV SOLN
10.0000 meq | INTRAVENOUS | Status: AC
  Administered 2021-04-28 (×3): 10 meq via INTRAVENOUS
  Filled 2021-04-28 (×2): qty 100

## 2021-04-28 MED ORDER — PANTOPRAZOLE SODIUM 40 MG PO TBEC
40.0000 mg | DELAYED_RELEASE_TABLET | Freq: Two times a day (BID) | ORAL | Status: DC
  Administered 2021-04-28 – 2021-04-30 (×5): 40 mg via ORAL
  Filled 2021-04-28 (×6): qty 1
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
## 2021-04-28 NOTE — Plan of Care (Signed)

## 2021-04-28 NOTE — Progress Notes (Signed)
Patient seen and examined.  80 year old female with recent history of distal biliary stricture with biliary obstruction and status post ERCP with negative brushings, sphincterotomy and biliary stent placement on 03/24/21, multiple duodenal ulcers related to her NSAID use presented to the emergency room with 2 days of epigastric pain, gradually elevated bilirubin and found to have biliary duct stent malfunction.  Admitted early morning of hours by nighttime hospitalist.  GI is involved and plan for ERCP and stent exchange today.  Her ammonia level is 41.  Patient is slightly impulsive and confused with no obvious neurological cause.  Probably due to acute illness.  WBC is elevated, no clinical evidence of cholangitis, however started on antibiotics after drawing blood cultures.  We will continue to monitor.

## 2021-04-28 NOTE — Anesthesia Preprocedure Evaluation (Addendum)
Anesthesia Evaluation  Patient identified by MRN, date of birth, ID band Patient awake    Reviewed: Allergy & Precautions, NPO status , Patient's Chart, lab work & pertinent test results  History of Anesthesia Complications Negative for: history of anesthetic complications  Airway Mallampati: II  TM Distance: >3 FB Neck ROM: Full    Dental  (+) Dental Advisory Given   Pulmonary Current Smoker and Patient abstained from smoking.,    Pulmonary exam normal        Cardiovascular negative cardio ROS Normal cardiovascular exam     Neuro/Psych negative neurological ROS  negative psych ROS   GI/Hepatic Neg liver ROS, PUD,   Endo/Other  negative endocrine ROS  Renal/GU negative Renal ROS     Musculoskeletal negative musculoskeletal ROS (+)   Abdominal   Peds  Hematology  (+) anemia ,   Anesthesia Other Findings Covid test negative   Reproductive/Obstetrics                            Anesthesia Physical Anesthesia Plan  ASA: 3  Anesthesia Plan: General   Post-op Pain Management:    Induction: Intravenous  PONV Risk Score and Plan: 3 and Treatment may vary due to age or medical condition, Ondansetron and Aprepitant  Airway Management Planned: Oral ETT  Additional Equipment: None  Intra-op Plan:   Post-operative Plan: Extubation in OR  Informed Consent: I have reviewed the patients History and Physical, chart, labs and discussed the procedure including the risks, benefits and alternatives for the proposed anesthesia with the patient or authorized representative who has indicated his/her understanding and acceptance.     Dental advisory given  Plan Discussed with: CRNA and Anesthesiologist  Anesthesia Plan Comments:        Anesthesia Quick Evaluation

## 2021-04-28 NOTE — ED Notes (Signed)
ED TO INPATIENT HANDOFF REPORT  Name/Age/Gender Alicia Spencer 80 y.o. female  Code Status Code Status History    Date Active Date Inactive Code Status Order ID Comments User Context   03/23/2021 0047 03/25/2021 1947 Full Code [id number]  Darlin DropHall, Carole N, DO ED   03/23/2021 0017 03/23/2021 0047 Full Code [id number]  Darlin DropHall, Carole N, DO ED    Questions for Most Recent Historical Code Status (Order [id number])       Home/SNF/Other Home  Chief Complaint Elevated LFTs [R79.89]  Level of Care/Admitting Diagnosis ED Disposition    ED Disposition  Admit   Condition  --   Comment  [facility]ospital [facility]: [facility] [[id number]]  Level of Care: Med-Surg [16]  [facility] patient in [facility] at [facility] or [facility] if equivalent level of care is available:: Yes  Covid Evaluation: Asymptomatic Screening Protocol (No Symptoms)  Diagnosis: Elevated LFTs [321910]  Admitting Physician: John GiovanniATHORE, VASUNDHRA [[id number]]  Attending Physician: John GiovanniATHORE, VASUNDHRA [[id number]]         Medical History Past Medical History:  Diagnosis Date  . Biliary obstruction 03/2021    Allergies Allergies  Allergen Reactions  . Sulfites Anaphylaxis  . Iodine Other (See Comments)    welts    IV Location/Drains/Wounds Patient Lines/Drains/Airways Status    Active Line/Drains/Airways    Name Placement date Placement time Site Days   Peripheral IV 04/27/21 20 G Left Forearm 04/27/21  2025  Forearm  1   GI Stent 10 Fr. 03/24/21  1554  --  35          Labs/Imaging Results for orders placed or performed during the hospital encounter of 04/27/21 (from the past 48 hour(s))  CBC with Differential     Status: Abnormal   Collection Time: 04/27/21  8:30 PM  Result Value Ref Range   WBC 15.3 (H) 4.0 - 10.5 K/uL   RBC 3.49 (L) 3.87 - 5.11 MIL/uL   Hemoglobin 11.4 (L) 12.0 - 15.0 g/dL   HCT 40.134.1 (L) 02.736.0 - 25.346.0 %   MCV 97.7 80.0 - 100.0 fL   MCH 32.7 26.0 - 34.0 pg   MCHC 33.4 30.0 - 36.0 g/dL    RDW 66.413.9 40.311.5 - 47.415.5 %   Platelets 485 (H) 150 - 400 K/uL   nRBC 0.0 0.0 - 0.2 %   Neutrophils Relative % 92 %   Neutro Abs 14.2 (H) 1.7 - 7.7 K/uL   Lymphocytes Relative 3 %   Lymphs Abs 0.5 (L) 0.7 - 4.0 K/uL   Monocytes Relative 4 %   Monocytes Absolute 0.6 0.1 - 1.0 K/uL   Eosinophils Relative 0 %   Eosinophils Absolute 0.0 0.0 - 0.5 K/uL   Basophils Relative 0 %   Basophils Absolute 0.1 0.0 - 0.1 K/uL   Immature Granulocytes 1 %   Abs Immature Granulocytes 0.07 0.00 - 0.07 K/uL    Comment: Performed at [facility], [address] [place], [place] [address]  Comprehensive metabolic panel     Status: Abnormal   Collection Time: 04/27/21  8:30 PM  Result Value Ref Range   Sodium 141 135 - 145 mmol/L   Potassium 2.7 (LL) 3.5 - 5.1 mmol/L    Comment: CRITICAL RESULT CALLED TO, READ BACK BY AND VERIFIED WITH: MILLER, J. RN @2127  04/27/21 KELLY, J.    Chloride 103 98 - 111 mmol/L   CO2 25 22 - 32 mmol/L   Glucose, Bld 189 (H) 70 - 99 mg/dL  Comment: Glucose reference range applies only to samples taken after fasting for at least 8 hours.   BUN 11 8 - 23 mg/dL   Creatinine, Ser 3.81 (L) 0.44 - 1.00 mg/dL   Calcium 82.9 8.9 - 93.7 mg/dL   Total Protein 7.4 6.5 - 8.1 g/dL   Albumin 3.6 3.5 - 5.0 g/dL   AST 169 (H) 15 - 41 U/L   ALT 105 (H) 0 - 44 U/L   Alkaline Phosphatase 540 (H) 38 - 126 U/L   Total Bilirubin 4.5 (H) 0.3 - 1.2 mg/dL   GFR, Estimated >67 >89 mL/min    Comment: (NOTE) Calculated using the CKD-EPI Creatinine Equation (2021)    Anion gap 13 5 - 15    Comment: Performed at [facility], [address], [place], [place] [address]  Lipase, blood     Status: None   Collection Time: 04/27/21  8:30 PM  Result Value Ref Range   Lipase 22 11 - 51 U/L    Comment: Performed at [facility], [address], [place], [place] [address]  Magnesium     Status: Abnormal   Collection Time: 04/27/21  8:30 PM   Result Value Ref Range   Magnesium 1.5 (L) 1.7 - 2.4 mg/dL    Comment: Performed at [facility], [address] [place], [place] [address]  Resp Panel by RT-PCR (Flu A&B, Covid) Nasopharyngeal Swab     Status: None   Collection Time: 04/27/21 10:40 PM   Specimen: Nasopharyngeal Swab; Nasopharyngeal(NP) swabs in vial transport medium  Result Value Ref Range   SARS Coronavirus 2 by RT PCR NEGATIVE NEGATIVE    Comment: (NOTE) SARS-CoV-2 target nucleic acids are NOT DETECTED.  The SARS-CoV-2 RNA is generally detectable in upper respiratory specimens during the acute phase of infection. The lowest concentration of SARS-CoV-2 viral copies this assay can detect is 138 copies/mL. A negative result does not preclude SARS-Cov-2 infection and should not be used as the sole basis for treatment or other patient management decisions. A negative result may occur with  improper specimen collection/handling, submission of specimen other than nasopharyngeal swab, presence of viral mutation(s) within the areas targeted by this assay, and inadequate number of viral copies(<138 copies/mL). A negative result must be combined with clinical observations, patient history, and epidemiological information. The expected result is Negative.  Fact Sheet for Patients:  BloggerCourse.com  Fact Sheet for Healthcare Providers:  SeriousBroker.it  This test is no t yet approved or cleared by the [place] FDA and  has been authorized for detection and/or diagnosis of SARS-CoV-2 by FDA under an Emergency Use Authorization (EUA). This EUA will remain  in effect (meaning this test can be used) for the duration of the COVID-19 declaration under Section 564(b)(1) of the Act, 21 U.S.C.section 360bbb-3(b)(1), unless the authorization is terminated  or revoked sooner.       Influenza A by PCR NEGATIVE NEGATIVE   Influenza B by PCR NEGATIVE  NEGATIVE    Comment: (NOTE) The Xpert Xpress SARS-CoV-2/FLU/RSV plus assay is intended as an aid in the diagnosis of influenza from Nasopharyngeal swab specimens and should not be used as a sole basis for treatment. Nasal washings and aspirates are unacceptable for Xpert Xpress SARS-CoV-2/FLU/RSV testing.  Fact Sheet for Patients: BloggerCourse.com  Fact Sheet for Healthcare Providers: SeriousBroker.it  This test is not yet approved or cleared by the [place] FDA and has been authorized for detection and/or diagnosis of SARS-CoV-2 by  FDA under an Emergency Use Authorization (EUA). This EUA will remain in effect (meaning this test can be used) for the duration of the COVID-19 declaration under Section 564(b)(1) of the Act, 21 U.S.C. section 360bbb-3(b)(1), unless the authorization is terminated or revoked.  Performed at [facility], [address], [place], [place] [address]    CT ABDOMEN PELVIS W CONTRAST  Result Date: 04/27/2021 CLINICAL DATA:  Epigastric pain, elevated liver function studies, and bilirubin. Recent biliary stent. Pain for 2 days. EXAM: CT ABDOMEN AND PELVIS WITH CONTRAST TECHNIQUE: Multidetector CT imaging of the abdomen and pelvis was performed using the standard protocol following bolus administration of intravenous contrast. CONTRAST:  66mL OMNIPAQUE IOHEXOL 350 MG/ML SOLN COMPARISON:  CT 03/22/2021.  MRI 03/23/2021 FINDINGS: Lower chest: Mild dependent atelectasis in the lung bases. Hepatobiliary: Diffusely distended gallbladder with mild pericholecystic edema. Prominent intra and extrahepatic bile duct dilatation. A distal biliary stent is present. Persistence of bile duct dilatation despite stent placement suggest possible stent malfunction. No definite mass lesion. Pancreas: Pancreatic atrophy.  No ductal dilatation or inflammation. Spleen: Normal in size without focal abnormality.  Adrenals/Urinary Tract: Adrenal glands are unremarkable. Kidneys are normal, without renal calculi, focal lesion, or hydronephrosis. Bladder is unremarkable. Stomach/Bowel: Stomach, small bowel, and colon are not abnormally distended. No wall thickening or inflammatory changes. Appendix is not identified. Vascular/Lymphatic: Aortic atherosclerosis. No enlarged abdominal or pelvic lymph nodes. Reproductive: Uterine calcification consistent with fibroid. No abnormal adnexal masses. Other: No free air or free fluid in the abdomen. Abdominal wall musculature appears intact. Musculoskeletal: Spondylolysis with mild spondylolisthesis at L4-5. Mild degenerative changes in the spine. Postoperative fixation of the right hip with degenerative change in the right hip. IMPRESSION: 1. Persistent intra and extrahepatic bile duct dilatation and gallbladder distension with a biliary stent present. Persistence of bile duct dilatation suggest possible stent malfunction. No mass or stone identified. 2. Pancreatic atrophy. No inflammatory changes or ductal dilatation demonstrated. No focal mass lesions identified. 3. Aortic atherosclerosis. 4. Calcified uterine fibroids. Electronically Signed   By: Burman Nieves M.D.   On: 04/27/2021 23:09    Pending Labs Unresulted Labs (From admission, onward)    Start     Ordered   04/27/21 1947  Urinalysis, Routine w reflex microscopic  (ED Abdominal Pain)  ONCE - STAT,   STAT        04/27/21 1946          Vitals/Pain Today's Vitals   04/27/21 1933 04/27/21 2100 04/27/21 2200 04/27/21 2321  BP: (!) 153/83 (!) 155/77 (!) 163/84   Pulse: 77 85 86   Resp: 18 18 18    Temp: 99.3 F (37.4 C)     TempSrc: Oral     SpO2: 95% 95% 92%   PainSc:    4     Isolation Precautions No active isolations  Medications Medications  sodium chloride (PF) 0.9 % injection (has no administration in time range)  sodium chloride 0.9 % bolus 1,000 mL (0 mLs Intravenous Stopped 04/27/21 2353)   morphine 4 MG/ML injection 4 mg (4 mg Intravenous Given 04/27/21 2026)  ondansetron (ZOFRAN) injection 4 mg (4 mg Intravenous Given 04/27/21 2026)  morphine 4 MG/ML injection 4 mg (4 mg Intravenous Given 04/27/21 2214)  potassium chloride 10 mEq in 100 mL IVPB (0 mEq Intravenous Stopped 04/27/21 2320)  magnesium sulfate IVPB 2 g 50 mL (0 g Intravenous Stopped 04/27/21 2353)  iohexol (OMNIPAQUE) 350 MG/ML injection 80 mL (80 mLs Intravenous Contrast Given  04/27/21 2255)    Mobility walks with person assist

## 2021-04-28 NOTE — H&P (Signed)
History and Physical    Alicia Spencer:[id number] DOB: 1941/09/28 DOA: 04/27/2021  PCP: Christain Sacramento, MD Patient coming from: Home  Chief Complaint: Abdominal pain  HPI: Alicia Spencer is a 80 y.o. female with no significant past medical history other than tobacco use admitted last month for indeterminate distal biliary stricture with biliary obstruction status post ERCP with negative brushings, sphincterotomy, and biliary duct stent placement on 03/24/2021.  Also had multiple ulcers in the duodenum which were felt to be related to NSAID use and was placed on Protonix 40 mg twice daily.  She had a markedly distended gallbladder on CT but general surgery did not feel that she had cholecystitis.  She had a follow-up visit with GI on 6/22 and is scheduled for repeat EUS/ERCP on 7/18 with Dr. Rush Landmark for FNA, biopsies, and stent exchange.    She presents to the ED with complaints of epigastric abdominal pain x2 days.  Not febrile.  Labs showing WBC 15.3, hemoglobin 11.4 (stable), platelet count 485K.  Sodium 141, potassium 2.7, chloride 103, bicarb 25, BUN 11, creatinine 0.4, glucose 189.  AST 114, ALT 105, alk phos 540, T bili 4.5.  LFTs significantly improved compared to labs done during hospitalization last month but slightly worse since labs done during follow-up visit with GI on 6/22.  Lipase normal.  Magnesium 1.5.  UA pending.  COVID and influenza PCR negative.  CT showing persistent intra and extrahepatic bile duct dilation and gallbladder distention, persistence of bile duct dilation suspicious for possible stent malfunction.  No mass or stone identified.  ED physician sent a secure chat message to Dr. Carlean Purl requesting consultation in the morning.  Patient was given morphine, Zofran, IV magnesium 2 g, IV potassium 10 mEq, and 1 L normal saline bolus.  History limited as patient is confused.  Not sure why she came to the hospital.  When asked whether she was having abdominal pain she  stated yes and initially complained of generalized abdominal pain but later complained of epigastric abdominal pain.  She is not sure when her pain started.  She does not think it is associated with eating.  Denies nausea, vomiting, or diarrhea.  Denies fevers or chills.  No other complaints.  Denies cough, shortness of breath, or chest pain.  Review of Systems:  All systems reviewed and apart from history of presenting illness, are negative.  Past Medical History:  Diagnosis Date   Biliary obstruction 03/2021    Past Surgical History:  Procedure Laterality Date   BILIARY BRUSHING  03/24/2021   Procedure: BILIARY BRUSHING;  Surgeon: Irene Shipper, MD;  Location: [facility] ENDOSCOPY;  Service: Endoscopy;;   BILIARY STENT PLACEMENT  03/24/2021   Procedure: BILIARY STENT PLACEMENT;  Surgeon: Irene Shipper, MD;  Location: [facility] ENDOSCOPY;  Service: Endoscopy;;   BIOPSY  03/24/2021   Procedure: BIOPSY;  Surgeon: Irene Shipper, MD;  Location: [facility] ENDOSCOPY;  Service: Endoscopy;;   ENDOSCOPIC RETROGRADE CHOLANGIOPANCREATOGRAPHY (ERCP) WITH PROPOFOL N/A 03/24/2021   Procedure: ENDOSCOPIC RETROGRADE CHOLANGIOPANCREATOGRAPHY (ERCP) WITH PROPOFOL;  Surgeon: Irene Shipper, MD;  Location: [place];  Service: Endoscopy;  Laterality: N/A;   SPHINCTEROTOMY  03/24/2021   Procedure: SPHINCTEROTOMY;  Surgeon: Irene Shipper, MD;  Location: [facility] ENDOSCOPY;  Service: Endoscopy;;     reports that she has been smoking cigarettes. She has never used smokeless tobacco. No history on file for alcohol use and drug use.  Allergies  Allergen Reactions   Sulfites Anaphylaxis   Iodine  Other (See Comments)    welts    Family History  Problem Relation Age of Onset   Cancer Mother        kidney?   Cancer - Other Father    Colon cancer Neg Hx    Esophageal cancer Neg Hx    Stomach cancer Neg Hx    Pancreatic cancer Neg Hx     Prior to Admission medications   Medication Sig Start Date End Date Taking? Authorizing Provider   acetaminophen (TYLENOL) 325 MG tablet Take 1 tablet (325 mg total) by mouth every 6 (six) hours as needed for mild pain or moderate pain. 03/25/21 03/25/22  Hongalgi, Lenis Dickinson, MD  pantoprazole (PROTONIX) 40 MG tablet Take 1 tablet (40 mg total) by mouth 2 (two) times daily. 04/12/21   Irene Shipper, MD    Physical Exam: Vitals:   04/27/21 2100 04/27/21 2200 04/28/21 0102 04/28/21 0554  BP: (!) 155/77 (!) 163/84 (!) 144/87 (!) 143/86  Pulse: 85 86 97 82  Resp: $Remo'18 18 20 20  'sqjqJ$ Temp:   98 F (36.7 C) 97.9 F (36.6 C)  TempSrc:   Oral Oral  SpO2: 95% 92% 98% 95%  Weight:   49.8 kg   Height:   '5\' 2"'$  (1.575 m)     Physical Exam Constitutional:      General: She is not in acute distress. HENT:     Head: Normocephalic and atraumatic.     Mouth/Throat:     Mouth: Mucous membranes are dry.  Eyes:     Extraocular Movements: Extraocular movements intact.     Conjunctiva/sclera: Conjunctivae normal.  Cardiovascular:     Rate and Rhythm: Normal rate and regular rhythm.     Pulses: Normal pulses.  Pulmonary:     Effort: Pulmonary effort is normal. No respiratory distress.     Breath sounds: Normal breath sounds. No wheezing or rales.  Abdominal:     General: Bowel sounds are normal. There is no distension.     Palpations: Abdomen is soft.     Tenderness: There is abdominal tenderness. There is no guarding or rebound.     Comments: Epigastrium tender to palpation  Musculoskeletal:        General: No swelling or tenderness.     Cervical back: Normal range of motion and neck supple.  Skin:    General: Skin is warm and dry.  Neurological:     General: No focal deficit present.     Mental Status: She is alert.     Cranial Nerves: No cranial nerve deficit.     Sensory: No sensory deficit.     Motor: No weakness.     Comments: Confused Oriented to self.  She knows she is at a hospital but does not know the name of this hospital or which city she is in.  Not oriented to time. Following  commands appropriately     Labs on Admission: I have personally reviewed following labs and imaging studies  CBC: Recent Labs  Lab 04/27/21 2030  WBC 15.3*  NEUTROABS 14.2*  HGB 11.4*  HCT 34.1*  MCV 97.7  PLT 220*   Basic Metabolic Panel: Recent Labs  Lab 04/27/21 2030  NA 141  K 2.7*  CL 103  CO2 25  GLUCOSE 189*  BUN 11  CREATININE 0.42*  CALCIUM 10.1  MG 1.5*   GFR: Estimated Creatinine Clearance: 46.3 mL/min (A) (by C-G formula based on SCr of 0.42 mg/dL (L)). Liver Function Tests:  Recent Labs  Lab 04/27/21 2030  AST 114*  ALT 105*  ALKPHOS 540*  BILITOT 4.5*  PROT 7.4  ALBUMIN 3.6   Recent Labs  Lab 04/27/21 2030  LIPASE 22   No results for input(s): AMMONIA in the last 168 hours. Coagulation Profile: No results for input(s): INR, PROTIME in the last 168 hours. Cardiac Enzymes: No results for input(s): CKTOTAL, CKMB, CKMBINDEX, TROPONINI in the last 168 hours. BNP (last 3 results) No results for input(s): PROBNP in the last 8760 hours. HbA1C: No results for input(s): HGBA1C in the last 72 hours. CBG: No results for input(s): GLUCAP in the last 168 hours. Lipid Profile: No results for input(s): CHOL, HDL, LDLCALC, TRIG, CHOLHDL, LDLDIRECT in the last 72 hours. Thyroid Function Tests: No results for input(s): TSH, T4TOTAL, FREET4, T3FREE, THYROIDAB in the last 72 hours. Anemia Panel: No results for input(s): VITAMINB12, FOLATE, FERRITIN, TIBC, IRON, RETICCTPCT in the last 72 hours. Urine analysis:    Component Value Date/Time   COLORURINE AMBER (A) 03/22/2021 2019   APPEARANCEUR HAZY (A) 03/22/2021 2019   LABSPEC 1.013 03/22/2021 2019   PHURINE 5.0 03/22/2021 2019   GLUCOSEU NEGATIVE 03/22/2021 2019   HGBUR NEGATIVE 03/22/2021 2019   BILIRUBINUR MODERATE (A) 03/22/2021 2019   KETONESUR 5 (A) 03/22/2021 2019   PROTEINUR NEGATIVE 03/22/2021 2019   NITRITE NEGATIVE 03/22/2021 2019   LEUKOCYTESUR TRACE (A) 03/22/2021 2019     Radiological Exams on Admission: CT ABDOMEN PELVIS W CONTRAST  Result Date: 04/27/2021 CLINICAL DATA:  Epigastric pain, elevated liver function studies, and bilirubin. Recent biliary stent. Pain for 2 days. EXAM: CT ABDOMEN AND PELVIS WITH CONTRAST TECHNIQUE: Multidetector CT imaging of the abdomen and pelvis was performed using the standard protocol following bolus administration of intravenous contrast. CONTRAST:  9mL OMNIPAQUE IOHEXOL 350 MG/ML SOLN COMPARISON:  CT 03/22/2021.  MRI 03/23/2021 FINDINGS: Lower chest: Mild dependent atelectasis in the lung bases. Hepatobiliary: Diffusely distended gallbladder with mild pericholecystic edema. Prominent intra and extrahepatic bile duct dilatation. A distal biliary stent is present. Persistence of bile duct dilatation despite stent placement suggest possible stent malfunction. No definite mass lesion. Pancreas: Pancreatic atrophy.  No ductal dilatation or inflammation. Spleen: Normal in size without focal abnormality. Adrenals/Urinary Tract: Adrenal glands are unremarkable. Kidneys are normal, without renal calculi, focal lesion, or hydronephrosis. Bladder is unremarkable. Stomach/Bowel: Stomach, small bowel, and colon are not abnormally distended. No wall thickening or inflammatory changes. Appendix is not identified. Vascular/Lymphatic: Aortic atherosclerosis. No enlarged abdominal or pelvic lymph nodes. Reproductive: Uterine calcification consistent with fibroid. No abnormal adnexal masses. Other: No free air or free fluid in the abdomen. Abdominal wall musculature appears intact. Musculoskeletal: Spondylolysis with mild spondylolisthesis at L4-5. Mild degenerative changes in the spine. Postoperative fixation of the right hip with degenerative change in the right hip. IMPRESSION: 1. Persistent intra and extrahepatic bile duct dilatation and gallbladder distension with a biliary stent present. Persistence of bile duct dilatation suggest possible stent  malfunction. No mass or stone identified. 2. Pancreatic atrophy. No inflammatory changes or ductal dilatation demonstrated. No focal mass lesions identified. 3. Aortic atherosclerosis. 4. Calcified uterine fibroids. Electronically Signed   By: Lucienne Capers M.D.   On: 04/27/2021 23:09    Assessment/Plan Principal Problem:   Biliary obstruction Active Problems:   Bile duct stricture   Duodenal ulcer   Elevated LFTs   Hypokalemia   Indeterminate distal biliary stricture with biliary obstruction status post sphincterotomy and biliary duct stent placement on 03/24/2021 Epigastric abdominal pain  Elevated liver enzymes Concern for biliary duct stent malfunction Admitted last month for indeterminate distal biliary stricture with biliary obstruction status post ERCP with negative brushings, sphincterotomy, and biliary duct stent placement on 03/24/2021.  She had a markedly distended gallbladder on CT but general surgery did not feel that she had cholecystitis.  She had a follow-up visit with GI on 6/22 and is scheduled for repeat EUS/ERCP on 7/18 with Dr. Rush Landmark for FNA, biopsies, and stent exchange.  She now presents complaining of epigastric abdominal pain.  CT showing persistent intra and extrahepatic bile duct dilation and gallbladder distention, persistence of bile duct dilation suspicious for possible stent malfunction.  No mass or stone identified. ?Acute cholangitis - WBC 15.3 but no fever or signs of sepsis. AST 114, ALT 105, alk phos 540, T bili 4.5.  Lipase normal.  LFTs significantly improved compared to labs done during hospitalization last month but slightly worse since labs done during follow-up visit with GI on 6/22.  -Will need repeat ERCP/stent exchange.  ED physician sent a secure chat message to Dr. Carlean Purl requesting consultation in the morning.  Keep n.p.o., IV fluid hydration, pain management.  Order blood cultures and start antibiotics-ceftriaxone and metronidazole.  Monitor WBC  count and LFTs.  Check lactic acid level.  Acute encephalopathy Possibly hepatic due to hyperammonemia given biliary obstruction. ?Baseline cognitive impairment/dementia. She is confused and disoriented but otherwise following commands appropriately and has no focal neurodeficit.  Not on anticoagulation/antiplatelet agents and no falls reported. -Check ammonia level  Duodenal ulcers Seen on recent ERCP and felt to be related to NSAID use. -Avoid NSAIDs.  Continue Protonix 40 mg twice daily.  Hypokalemia Hypomagnesemia Possibly due to poor oral intake. -Replete potassium and magnesium, monitor levels closely.  Order EKG.  DVT prophylaxis: SCDs Code Status: Full code Family Communication: No family available at this time. Disposition Plan: Status is: Observation  The patient remains OBS appropriate and will d/c before 2 midnights.  Dispo: The patient is from: Home              Anticipated d/c is to: Home              Patient currently is not medically stable to d/c.   Difficult to place patient No  Level of care: Level of care: Med-Surg  The medical decision making on this patient was of high complexity and the patient is at high risk for clinical deterioration, therefore this is a level 3 visit.  Shela Leff MD [facility]  If 7PM-7AM, please contact night-coverage [email or web address]  04/28/2021, 5:58 AM

## 2021-04-29 ENCOUNTER — Inpatient Hospital Stay (HOSPITAL_COMMUNITY): Payer: Medicare Other | Admitting: Anesthesiology

## 2021-04-29 ENCOUNTER — Inpatient Hospital Stay (HOSPITAL_COMMUNITY): Payer: Medicare Other

## 2021-04-29 ENCOUNTER — Encounter (HOSPITAL_COMMUNITY): Payer: Self-pay | Admitting: Internal Medicine

## 2021-04-29 ENCOUNTER — Encounter (HOSPITAL_COMMUNITY)
Admission: EM | Disposition: A | Discharge status: Home / Self Care | Payer: Self-pay | Source: Home / Self Care | Attending: Internal Medicine

## 2021-04-29 DIAGNOSIS — R7881 Bacteremia: Secondary | ICD-10-CM | POA: Diagnosis present

## 2021-04-29 HISTORY — PX: ERCP: SHX5425

## 2021-04-29 HISTORY — PX: STENT REMOVAL: SHX6421

## 2021-04-29 HISTORY — PX: BILIARY BRUSHING: SHX6843

## 2021-04-29 HISTORY — PX: BILIARY STENT PLACEMENT: SHX5538

## 2021-04-29 LAB — CBC
HCT: 29 % — ABNORMAL LOW (ref 36.0–46.0)
Hemoglobin: 9.7 g/dL — ABNORMAL LOW (ref 12.0–15.0)
MCH: 32.9 pg (ref 26.0–34.0)
MCHC: 33.4 g/dL (ref 30.0–36.0)
MCV: 98.3 fL (ref 80.0–100.0)
Platelets: 327 10*3/uL (ref 150–400)
RBC: 2.95 MIL/uL — ABNORMAL LOW (ref 3.87–5.11)
RDW: 14.1 % (ref 11.5–15.5)
WBC: 14 10*3/uL — ABNORMAL HIGH (ref 4.0–10.5)
nRBC: 0 % (ref 0.0–0.2)

## 2021-04-29 LAB — BLOOD CULTURE ID PANEL (REFLEXED) - BCID2

## 2021-04-29 SURGERY — ERCP, WITH INTERVENTION IF INDICATED
Anesthesia: General

## 2021-04-29 MED ORDER — INDOMETHACIN 50 MG RE SUPP
RECTAL | Status: DC | PRN
  Administered 2021-04-29: 100 mg via RECTAL

## 2021-04-29 MED ORDER — INDOMETHACIN 50 MG RE SUPP
RECTAL | Status: AC
  Filled 2021-04-29: qty 2

## 2021-04-29 MED ORDER — FENTANYL CITRATE (PF) 100 MCG/2ML IJ SOLN: 25.0000 ug | INTRAMUSCULAR | Status: DC | PRN

## 2021-04-29 MED ORDER — FENTANYL CITRATE (PF) 100 MCG/2ML IJ SOLN
INTRAMUSCULAR | Status: AC
  Filled 2021-04-29: qty 2

## 2021-04-29 MED ORDER — HALOPERIDOL LACTATE 5 MG/ML IJ SOLN
INTRAMUSCULAR | Status: AC
  Administered 2021-04-29: 5 mg
  Filled 2021-04-29: qty 1

## 2021-04-29 MED ORDER — FENTANYL CITRATE (PF) 100 MCG/2ML IJ SOLN
INTRAMUSCULAR | Status: DC | PRN
  Administered 2021-04-29 (×3): 25 ug via INTRAVENOUS
  Administered 2021-04-29: 50 ug via INTRAVENOUS
  Administered 2021-04-29 (×3): 25 ug via INTRAVENOUS

## 2021-04-29 MED ORDER — ONDANSETRON HCL 4 MG/2ML IJ SOLN: 4.0000 mg | Freq: Once | INTRAMUSCULAR | Status: DC | PRN

## 2021-04-29 MED ORDER — HALOPERIDOL LACTATE 5 MG/ML IJ SOLN: 2.0000 mg | Freq: Once | INTRAMUSCULAR | Status: AC

## 2021-04-29 MED ORDER — PHENYLEPHRINE 40 MCG/ML (10ML) SYRINGE FOR IV PUSH (FOR BLOOD PRESSURE SUPPORT)
PREFILLED_SYRINGE | INTRAVENOUS | Status: DC | PRN
  Administered 2021-04-29 (×4): 120 ug via INTRAVENOUS

## 2021-04-29 MED ORDER — HYDROXYZINE HCL 50 MG/ML IM SOLN
25.0000 mg | Freq: Four times a day (QID) | INTRAMUSCULAR | Status: DC | PRN
  Filled 2021-04-29 (×4): qty 0.5

## 2021-04-29 MED ORDER — SODIUM CHLORIDE 0.9 % IV SOLN: INTRAVENOUS | Status: DC

## 2021-04-29 MED ORDER — PROPOFOL 10 MG/ML IV BOLUS
INTRAVENOUS | Status: DC | PRN
  Administered 2021-04-29: 100 mg via INTRAVENOUS

## 2021-04-29 MED ORDER — LACTATED RINGERS IV SOLN
INTRAVENOUS | Status: AC | PRN
  Administered 2021-04-29: 20 mL/h via INTRAVENOUS

## 2021-04-29 MED ORDER — SODIUM CHLORIDE 0.9 % IV SOLN
INTRAVENOUS | Status: DC | PRN
  Administered 2021-04-29: 20 mL

## 2021-04-29 MED ORDER — ROCURONIUM BROMIDE 10 MG/ML (PF) SYRINGE
PREFILLED_SYRINGE | INTRAVENOUS | Status: DC | PRN
  Administered 2021-04-29: 40 mg via INTRAVENOUS

## 2021-04-29 MED ORDER — SUGAMMADEX SODIUM 200 MG/2ML IV SOLN
INTRAVENOUS | Status: DC | PRN
  Administered 2021-04-29: 100 mg via INTRAVENOUS

## 2021-04-29 MED ORDER — ONDANSETRON HCL 4 MG/2ML IJ SOLN
INTRAMUSCULAR | Status: DC | PRN
  Administered 2021-04-29: 4 mg via INTRAVENOUS

## 2021-04-29 MED ORDER — SODIUM CHLORIDE 0.9 % IV SOLN: INTRAVENOUS | Status: AC

## 2021-04-29 MED ORDER — PHENYLEPHRINE HCL (PRESSORS) 10 MG/ML IV SOLN
INTRAVENOUS | Status: AC
  Filled 2021-04-29: qty 1

## 2021-04-29 MED ORDER — PHENYLEPHRINE HCL-NACL 10-0.9 MG/250ML-% IV SOLN
INTRAVENOUS | Status: DC | PRN
  Administered 2021-04-29: 75 ug/min via INTRAVENOUS

## 2021-04-29 MED ORDER — LIDOCAINE 2% (20 MG/ML) 5 ML SYRINGE
INTRAMUSCULAR | Status: DC | PRN
  Administered 2021-04-29: 40 mg via INTRAVENOUS

## 2021-04-29 NOTE — Op Note (Addendum)
[facility] Patient Name: Alicia Spencer Procedure Date: 04/29/2021 MRN: [id number] Attending MD: Wilhemina Bonito. Marina Goodell , MD Date of Birth: 1940-11-17 CSN: [id number] Age: 80 Admit Type: Inpatient Procedure:                ERCP with brushings, stent removal, and biliary                            stent placement Indications:              Common bile duct stricture. The patient was found                            to have an indeterminate distal bile duct stricture                            for which she underwent ERCP with stent placement 5                            weeks ago. She presents to the hospital now with                            abdominal pain and elevated liver test as well as                            biliary ductal dilation suggestive of either stent                            migration or stent occlusion. Now for ERCP Providers:                Wilhemina Bonito. Marina Goodell, MD, Vicki Mallet, RN, Kandice Robinsons, Technician Referring MD:             [facility] hospital[facility] Medicines:                General Anesthesia Complications:            No immediate complications. Estimated Blood Loss:     Estimated blood loss: none. Procedure:                Pre-Anesthesia Assessment:                           - Prior to the procedure, a History and Physical                            was performed, and patient medications and                            allergies were reviewed. The patient's tolerance of                            previous anesthesia was also reviewed. The risks  and benefits of the procedure and the sedation                            options and risks were discussed with the patient.                            All questions were answered, and informed consent                            was obtained. Prior Anticoagulants: The patient has                            taken no previous anticoagulant or antiplatelet                             agents. ASA Grade Assessment: II - A patient with                            mild systemic disease. After reviewing the risks                            and benefits, the patient was deemed in                            satisfactory condition to undergo the procedure.                           After obtaining informed consent, the scope was                            passed under direct vision. Throughout the                            procedure, the patient's blood pressure, pulse, and                            oxygen saturations were monitored continuously. The                            Olympus [id number]) was introduced through                            the mouth, and used to inject contrast into and                            used to inject contrast into the bile duct. The                            ERCP was accomplished without difficulty. The                            patient tolerated the procedure well. Scope In: Scope Out: Findings:      1. Esophagus was not examined.  Stomach was unremarkable. The duodenal       bulb and postbulbar duodenum demonstrated evidence of ulcer healing from       previous exam. The previously placed biliary stent was protruding into       the duodenum      2. The original biliary stent was removed with an endoscopic snare after       performing scout radiograph      3. The common duct was selectively cannulated. Injection of contrast       yielded diffuse dilation of the biliary tree with abrupt cut off of the       distal duct measuring about 1.3 cm.      4. For cytology across the stricture was performed. This was bloody.      5. A 10 French 5 cm in length plastic stent with flaps on each end was       placed into the bile duct. Good position. Excellent drainage.      6. There was no manipulation of the pancreatic duct Impression:               1. Biliary stent malfunction status post ERC with                             stent exchange                           2. Indeterminate distal biliary stricture measuring                            1.3 cm. Status post brush cytology                           3. Continue antibiotics                           4. Moderate clinical course and blood work                           5. Has plans for EUS with Dr. Meridee Score 05/08/2021                           GI will follow Moderate Sedation:      none Recommendation:           - Observe patient's clinical course. Procedure Code(s):        --- Professional ---                           [phone], Endoscopic retrograde                            cholangiopancreatography (ERCP); with removal and                            exchange of stent(s), biliary or pancreatic duct,                            including pre- and post-dilation and guide wire  passage, when performed, including sphincterotomy,                            when performed, each stent exchanged Diagnosis Code(s):        --- Professional ---                           Z46.59, Encounter for fitting and adjustment of                            other gastrointestinal appliance and device                           K83.1, Obstruction of bile duct CPT copyright 2019 [employer]. All rights reserved. The codes documented in this report are preliminary and upon coder review may  be revised to meet current compliance requirements. Wilhemina Bonito. Marina Goodell, MD 04/29/2021 11:31:10 AM This report has been signed electronically. Number of Addenda: 0

## 2021-04-29 NOTE — Anesthesia Postprocedure Evaluation (Signed)
Anesthesia Post Note  Patient: Alicia Spencer  Procedure(s) Performed: ENDOSCOPIC RETROGRADE CHOLANGIOPANCREATOGRAPHY (ERCP) STENT REMOVAL BILIARY BRUSHING BILIARY STENT PLACEMENT     Patient location during evaluation: PACU Anesthesia Type: General Level of consciousness: awake and alert Pain management: pain level controlled Vital Signs Assessment: post-procedure vital signs reviewed and stable Respiratory status: spontaneous breathing, nonlabored ventilation and respiratory function stable Cardiovascular status: stable and blood pressure returned to baseline Anesthetic complications: no   No notable events documented.  Last Vitals:  Vitals:   04/29/21 1150 04/29/21 1405  BP: 114/75 (!) 145/90  Pulse: 72 (!) 59  Resp: 15 20  Temp:    SpO2: 97% 93%    Last Pain:  Vitals:   04/29/21 1150  TempSrc:   PainSc: 0-No pain                 Beryle Lathe

## 2021-04-29 NOTE — Transfer of Care (Signed)
Immediate Anesthesia Transfer of Care Note  Patient: Alicia Spencer  Procedure(s) Performed: ENDOSCOPIC RETROGRADE CHOLANGIOPANCREATOGRAPHY (ERCP) STENT REMOVAL BILIARY BRUSHING BILIARY STENT PLACEMENT  Patient Location: PACU and Endoscopy Unit  Anesthesia Type:General  Level of Consciousness: awake, alert  and oriented  Airway & Oxygen Therapy: Patient Spontanous Breathing and Patient connected to face mask  Post-op Assessment: Report given to RN and Post -op Vital signs reviewed and stable  Post vital signs: Reviewed and stable  Last Vitals:  Vitals Value Taken Time  BP 152/73 04/29/21 1120  Temp    Pulse 88 04/29/21 1120  Resp 16 04/29/21 1120  SpO2 95 % 04/29/21 1120    Last Pain:  Vitals:   04/29/21 1120  TempSrc:   PainSc: Asleep      Patients Stated Pain Goal: 0 (04/28/21 0826)  Complications: No notable events documented.

## 2021-04-29 NOTE — Anesthesia Procedure Notes (Signed)
Procedure Name: Intubation Date/Time: 04/29/2021 9:47 AM Performed by: Sudie Grumbling, CRNA Pre-anesthesia Checklist: Patient identified, Emergency Drugs available, Suction available and Patient being monitored Patient Re-evaluated:Patient Re-evaluated prior to induction Oxygen Delivery Method: Circle system utilized Preoxygenation: Pre-oxygenation with 100% oxygen Induction Type: IV induction Ventilation: Mask ventilation without difficulty Laryngoscope Size: Miller and 2 Grade View: Grade I Tube type: Oral Tube size: 7.0 mm Number of attempts: 1 Airway Equipment and Method: Stylet and Oral airway Placement Confirmation: ETT inserted through vocal cords under direct vision, positive ETCO2 and breath sounds checked- equal and bilateral Secured at: 22 cm Tube secured with: Tape Dental Injury: Teeth and Oropharynx as per pre-operative assessment

## 2021-04-29 NOTE — Interval H&P Note (Signed)
History and Physical Interval Note:  04/29/2021 9:35 AM  Alicia Spencer  has presented today for surgery, with the diagnosis of biliary obstruction.  The various methods of treatment have been discussed with the patient and family. After consideration of risks, benefits and other options for treatment, the patient has consented to  Procedure(s): ENDOSCOPIC RETROGRADE CHOLANGIOPANCREATOGRAPHY (ERCP) (N/A) as a surgical intervention.  The patient's history has been reviewed, patient examined, no change in status, stable for surgery.  I have reviewed the patient's chart and labs.  Questions were answered to the patient's satisfaction.     Yancey Flemings

## 2021-04-29 NOTE — Progress Notes (Signed)
PROGRESS NOTE    Alicia Spencer  GGE:[id number] DOB: [birth year] DOA: 04/27/2021 PCP: [name], MD    Brief Narrative:  80 year old female with no significant medical history but recently diagnosed distal biliary stricture status post ERCP presented back to the hospital with abdominal pain and abnormal LFTs including worsening bilirubin.  Imaging studies consistent with diffuse dilation of intra and extra hepatic biliary duct.  Admitted with hypokalemia, abnormal LFTs and confusion.   Assessment & Plan:   Principal Problem:   Biliary obstruction Active Problems:   Bile duct stricture   Duodenal ulcer   Elevated LFTs   Hypokalemia   Jaundice  Biliary obstruction: Suspected biliary stricture.  Previous investigations negative for malignancy. Plan for bile duct stent exchange today. Suspect cholangitis on Zosyn. Continue antibiotics, procedure today, recheck LFTs tomorrow.  Brushing and biopsies today.  Followed by GI.  Acute metabolic infective encephalopathy: Suspected secondary to cholangitis. Bacteremia with gram-negative bacteria secondary to cholangitis.  On Zosyn.  We will continue until clinical improvement and treat with 7 days of antibiotic therapy. No focal deficits.  Clinically stabilizing.  Duodenal ulcer: On PPI.  Healing ulcers according to new endoscopy findings.  Hypokalemia and hypomagnesemia: Replaced aggressively with improvement.  We will recheck tomorrow morning.  DVT prophylaxis: SCDs Start: 04/28/21 0640   Code Status: Full code Family Communication: None at the bedside Disposition Plan: Status is: Inpatient  Remains inpatient appropriate because:IV treatments appropriate due to intensity of illness or inability to take PO and Inpatient level of care appropriate due to severity of illness  Dispo: The patient is from: Home              Anticipated d/c is to: Home              Patient currently is not medically stable to d/c.   Difficult to place  patient No         Consultants:  Gastroenterology  Procedures:  ERCP 7/9  Antimicrobials:  Zosyn 7/8----   Subjective: Patient seen and examined in the morning rounds.  Overnight events noted.  She had restlessness and confusion given 1 dose of Haldol.  In the morning rounds she was still confused but she was more calm and composed since yesterday.  She knew she is going for procedure.  No family was at the bedside.  She was asking me to find her husband.  Objective: Vitals:   04/29/21 1120 04/29/21 1130 04/29/21 1140 04/29/21 1150  BP: (!) 152/73 129/72 (!) 151/81 114/75  Pulse: 88 84 80 72  Resp: 16 18 19 15   Temp: 98.9 F (37.2 C)     TempSrc: Oral     SpO2: 95% 100% 92% 97%  Weight:      Height:        Intake/Output Summary (Last 24 hours) at 04/29/2021 1154 Last data filed at 04/29/2021 1110 Gross per 24 hour  Intake 1890.74 ml  Output 1050 ml  Net 840.74 ml   Filed Weights   04/28/21 0102  Weight: 49.8 kg    Examination:  General exam: Appears calm and comfortable today. Mostly on room air. Patient is icteric.  Not in any distress. Respiratory system: Clear to auscultation. Respiratory effort normal. Cardiovascular system: S1 & S2 heard, RRR. No JVD, murmurs, rubs, gallops or clicks. No pedal edema. Gastrointestinal system: Soft and nontender.   Central nervous system: Alert and oriented x1-2.  No focal neurological deficits. Extremities: Symmetric 5 x 5 power. Skin: No rashes,  lesions or ulcers Psychiatry: Judgement and insight appear compromised.   Data Reviewed: I have personally reviewed following labs and imaging studies  CBC: Recent Labs  Lab 04/27/21 2030 04/28/21 0706 04/29/21 0410  WBC 15.3* 19.7* 14.0*  NEUTROABS 14.2*  --   --   HGB 11.4* 11.2* 9.7*  HCT 34.1* 34.4* 29.0*  MCV 97.7 100.6* 98.3  PLT 485* 415* 327   Basic Metabolic Panel: Recent Labs  Lab 04/27/21 2030 04/28/21 0706 04/28/21 1443  NA 141 142 140  K 2.7*  3.3* 3.7  CL 103 106 111  CO2 25 26 23   GLUCOSE 189* 144* 129*  BUN 11 10 10   CREATININE 0.42* 0.51 0.43*  CALCIUM 10.1 9.6 8.7*  MG 1.5* 2.1  --    GFR: Estimated Creatinine Clearance: 46.3 mL/min (A) (by C-G formula based on SCr of 0.43 mg/dL (L)). Liver Function Tests: Recent Labs  Lab 04/27/21 2030 04/28/21 0706 04/28/21 1443  AST 114* 88* 59*  ALT 105* 97* 77*  ALKPHOS 540* 516* 433*  BILITOT 4.5* 5.3* 5.0*  PROT 7.4 7.2 6.1*  ALBUMIN 3.6 3.4* 2.8*   Recent Labs  Lab 04/27/21 2030  LIPASE 22   Recent Labs  Lab 04/28/21 0706  AMMONIA 41*   Coagulation Profile: No results for input(s): INR, PROTIME in the last 168 hours. Cardiac Enzymes: No results for input(s): CKTOTAL, CKMB, CKMBINDEX, TROPONINI in the last 168 hours. BNP (last 3 results) No results for input(s): PROBNP in the last 8760 hours. HbA1C: No results for input(s): HGBA1C in the last 72 hours. CBG: No results for input(s): GLUCAP in the last 168 hours. Lipid Profile: No results for input(s): CHOL, HDL, LDLCALC, TRIG, CHOLHDL, LDLDIRECT in the last 72 hours. Thyroid Function Tests: No results for input(s): TSH, T4TOTAL, FREET4, T3FREE, THYROIDAB in the last 72 hours. Anemia Panel: No results for input(s): VITAMINB12, FOLATE, FERRITIN, TIBC, IRON, RETICCTPCT in the last 72 hours. Sepsis Labs: Recent Labs  Lab 04/28/21 0706  LATICACIDVEN 1.0    Recent Results (from the past 240 hour(s))  Resp Panel by RT-PCR (Flu A&B, Covid) Nasopharyngeal Swab     Status: None   Collection Time: 04/27/21 10:40 PM   Specimen: Nasopharyngeal Swab; Nasopharyngeal(NP) swabs in vial transport medium  Result Value Ref Range Status   SARS Coronavirus 2 by RT PCR NEGATIVE NEGATIVE Final    Comment: (NOTE) SARS-CoV-2 target nucleic acids are NOT DETECTED.  The SARS-CoV-2 RNA is generally detectable in upper respiratory specimens during the acute phase of infection. The lowest concentration of SARS-CoV-2 viral  copies this assay can detect is 138 copies/mL. A negative result does not preclude SARS-Cov-2 infection and should not be used as the sole basis for treatment or other patient management decisions. A negative result may occur with  improper specimen collection/handling, submission of specimen other than nasopharyngeal swab, presence of viral mutation(s) within the areas targeted by this assay, and inadequate number of viral copies(<138 copies/mL). A negative result must be combined with clinical observations, patient history, and epidemiological information. The expected result is Negative.  Fact Sheet for Patients:  0707  Fact Sheet for Healthcare Providers:  06/28/21  This test is no t yet approved or cleared by the [employer] FDA and  has been authorized for detection and/or diagnosis of SARS-CoV-2 by FDA under an Emergency Use Authorization (EUA). This EUA will remain  in effect (meaning this test can be used) for the duration of the COVID-19 declaration under Section 564(b)(1) of the  Act, 21 U.S.C.section 360bbb-3(b)(1), unless the authorization is terminated  or revoked sooner.       Influenza A by PCR NEGATIVE NEGATIVE Final   Influenza B by PCR NEGATIVE NEGATIVE Final    Comment: (NOTE) The Xpert Xpress SARS-CoV-2/FLU/RSV plus assay is intended as an aid in the diagnosis of influenza from Nasopharyngeal swab specimens and should not be used as a sole basis for treatment. Nasal washings and aspirates are unacceptable for Xpert Xpress SARS-CoV-2/FLU/RSV testing.  Fact Sheet for Patients: [employer]com  Fact Sheet for Healthcare Providers: [employer]ious[employer]ker[employer]it  This test is not yet approved or cleared by the [place] FDA and has been authorized for detection and/or diagnosis of SARS-CoV-2 by FDA under an Emergency Use Authorization (EUA). This  EUA will remain in effect (meaning this test can be used) for the duration of the COVID-19 declaration under Section 564(b)(1) of the Act, 21 U.S.C. section 360bbb-3(b)(1), unless the authorization is terminated or revoked.  Performed at [facility], [address] [place], [place] [address]   Culture, blood (routine x 2)     Status: None (Preliminary result)   Collection Time: 04/28/21  7:10 AM   Specimen: BLOOD  Result Value Ref Range Status   Specimen Description   Final    BLOOD RIGHT ANTECUBITAL Performed at [facility], [address] [place], [place] [address]    Special Requests   Final    BOTTLES DRAWN AEROBIC AND ANAEROBIC Blood Culture adequate volume Performed at [facility], [address] [place], [place] [address]    Culture   Final    NO GROWTH < 24 HOURS Performed at [facility], [address] [place], [place] [address]    Report Status PENDING  Incomplete  Culture, blood (routine x 2)     Status: None (Preliminary result)   Collection Time: 04/28/21  7:21 AM   Specimen: BLOOD  Result Value Ref Range Status   Specimen Description   Final    BLOOD LEFT ANTECUBITAL Performed at [facility], [address], [place], [place] [address]    Special Requests   Final    BOTTLES DRAWN AEROBIC AND ANAEROBIC Blood Culture adequate volume Performed at [facility], [address], [place], [place] [address]    Culture  Setup Time   Final    GRAM NEGATIVE RODS AEROBIC BOTTLE ONLY CRITICAL RESULT CALLED TO, READ BACK BY AND VERIFIED WITH: TERRI GREEN PHARMD @1122  04/29/21 EB Performed at [facility], [address], [place], [place]    Culture GRAM NEGATIVE RODS  Final   Report Status PENDING  Incomplete  Blood Culture ID Panel (Reflexed)     Status: Abnormal   Collection Time: 04/28/21  7:21 AM  Result Value Ref Range Status   Enterococcus  faecalis NOT DETECTED NOT DETECTED Final   Enterococcus Faecium NOT DETECTED NOT DETECTED Final   Listeria monocytogenes NOT DETECTED NOT DETECTED Final   Staphylococcus species NOT DETECTED NOT DETECTED Final   Staphylococcus aureus (BCID) NOT DETECTED NOT DETECTED Final   Staphylococcus epidermidis NOT DETECTED NOT DETECTED Final   Staphylococcus lugdunensis NOT DETECTED NOT DETECTED Final   Streptococcus species NOT DETECTED NOT DETECTED Final   Streptococcus agalactiae NOT DETECTED NOT DETECTED Final   Streptococcus pneumoniae NOT DETECTED NOT DETECTED Final   Streptococcus pyogenes NOT DETECTED NOT DETECTED Final   A.calcoaceticus-baumannii NOT DETECTED NOT DETECTED Final   Bacteroides fragilis  NOT DETECTED NOT DETECTED Final   Enterobacterales DETECTED (A) NOT DETECTED Final    Comment: Enterobacterales represent a large order of gram negative bacteria, not a single organism. CRITICAL RESULT CALLED TO, READ BACK BY AND VERIFIED WITH: TERRI GREEN PHARMD  04/29/21 EB    Enterobacter cloacae complex NOT DETECTED NOT DETECTED Final   Escherichia coli NOT DETECTED NOT DETECTED Final   Klebsiella aerogenes NOT DETECTED NOT DETECTED Final   Klebsiella oxytoca DETECTED (A) NOT DETECTED Final    Comment: CRITICAL RESULT CALLED TO, READ BACK BY AND VERIFIED WITH: TERRI GREEN PHARMD  04/29/21 EB    Klebsiella pneumoniae NOT DETECTED NOT DETECTED Final   Proteus species NOT DETECTED NOT DETECTED Final   Salmonella species NOT DETECTED NOT DETECTED Final   Serratia marcescens NOT DETECTED NOT DETECTED Final   Haemophilus influenzae NOT DETECTED NOT DETECTED Final   Neisseria meningitidis NOT DETECTED NOT DETECTED Final   Pseudomonas aeruginosa NOT DETECTED NOT DETECTED Final   Stenotrophomonas maltophilia NOT DETECTED NOT DETECTED Final   Candida albicans NOT DETECTED NOT DETECTED Final   Candida auris NOT DETECTED NOT DETECTED Final   Candida glabrata NOT DETECTED NOT DETECTED  Final   Candida krusei NOT DETECTED NOT DETECTED Final   Candida parapsilosis NOT DETECTED NOT DETECTED Final   Candida tropicalis NOT DETECTED NOT DETECTED Final   Cryptococcus neoformans/gattii NOT DETECTED NOT DETECTED Final   CTX-M ESBL NOT DETECTED NOT DETECTED Final   Carbapenem resistance IMP NOT DETECTED NOT DETECTED Final   Carbapenem resistance KPC NOT DETECTED NOT DETECTED Final   Carbapenem resistance NDM NOT DETECTED NOT DETECTED Final   Carbapenem resist OXA 48 LIKE NOT DETECTED NOT DETECTED Final   Carbapenem resistance VIM NOT DETECTED NOT DETECTED Final    Comment: Performed at [facility], [address] [place], [place] [address]         Radiology Studies: CT ABDOMEN PELVIS W CONTRAST  Result Date: 04/27/2021 CLINICAL DATA:  Epigastric pain, elevated liver function studies, and bilirubin. Recent biliary stent. Pain for 2 days. EXAM: CT ABDOMEN AND PELVIS WITH CONTRAST TECHNIQUE: Multidetector CT imaging of the abdomen and pelvis was performed using the standard protocol following bolus administration of intravenous contrast. CONTRAST:  80mL OMNIPAQUE IOHEXOL 350 MG/ML SOLN COMPARISON:  CT 03/22/2021.  MRI 03/23/2021 FINDINGS: Lower chest: Mild dependent atelectasis in the lung bases. Hepatobiliary: Diffusely distended gallbladder with mild pericholecystic edema. Prominent intra and extrahepatic bile duct dilatation. A distal biliary stent is present. Persistence of bile duct dilatation despite stent placement suggest possible stent malfunction. No definite mass lesion. Pancreas: Pancreatic atrophy.  No ductal dilatation or inflammation. Spleen: Normal in size without focal abnormality. Adrenals/Urinary Tract: Adrenal glands are unremarkable. Kidneys are normal, without renal calculi, focal lesion, or hydronephrosis. Bladder is unremarkable. Stomach/Bowel: Stomach, small bowel, and colon are not abnormally distended. No wall thickening or inflammatory changes. Appendix  is not identified. Vascular/Lymphatic: Aortic atherosclerosis. No enlarged abdominal or pelvic lymph nodes. Reproductive: Uterine calcification consistent with fibroid. No abnormal adnexal masses. Other: No free air or free fluid in the abdomen. Abdominal wall musculature appears intact. Musculoskeletal: Spondylolysis with mild spondylolisthesis at L4-5. Mild degenerative changes in the spine. Postoperative fixation of the right hip with degenerative change in the right hip. IMPRESSION: 1. Persistent intra and extrahepatic bile duct dilatation and gallbladder distension with a biliary stent present. Persistence of bile duct dilatation suggest possible stent malfunction. No mass or stone identified. 2. Pancreatic atrophy. No inflammatory changes  or ductal dilatation demonstrated. No focal mass lesions identified. 3. Aortic atherosclerosis. 4. Calcified uterine fibroids. Electronically Signed   By: Burman NievesWilliam  Stevens M.D.   On: 04/27/2021 23:09   DG ERCP  Result Date: 04/29/2021 CLINICAL DATA:  Biliary stent exchange. EXAM: ERCP TECHNIQUE: Multiple spot images obtained with the fluoroscopic device and submitted for interpretation post-procedure. COMPARISON:  ERCP-03/24/2021 FLUOROSCOPY TIME:  5 minutes, 4 seconds (89.4 mGy) FINDINGS: 12 spot intraoperative fluoroscopic images of the right upper abdominal quadrant during ERCP provided for review. Initial image demonstrates an ERCP probe overlying the right upper abdominal quadrant. There has been removal of the pre-existing internal biliary stent. Subsequent images demonstrate selective cannulation and opacification of the common bile duct which appears markedly dilated. There is opacification of the intrahepatic biliary tree which appears at least moderately dilated. Additional radiopaque wire overlies the right upper abdominal quadrant, potentially external to the patient. There is abrupt narrowing/occlusion involving the distal aspect of the CBD. There is no  definitive opacification of either the cystic or pancreatic ducts. Completion images demonstrate placement of an internal biliary stent overlying expected location of the CBD. IMPRESSION: ERCP with biliary stent exchange. These images were submitted for radiologic interpretation only. Please see the procedural report for the amount of contrast and the fluoroscopy time utilized. Electronically Signed   By: Simonne ComeJohn  Watts M.D.   On: 04/29/2021 11:36        Scheduled Meds:  aprepitant  40 mg Oral Once   [MAR Hold] indomethacin  100 mg Rectal Once   [MAR Hold] pantoprazole  40 mg Oral BID   [MAR Hold] potassium chloride  40 mEq Oral BID   Continuous Infusions:  [MAR Hold] piperacillin-tazobactam (ZOSYN)  IV 3.375 g (04/29/21 0626)     LOS: 1 day    Time spent: 35 minutes    Dorcas CarrowKuber Tiyona Desouza, MD [facility] Pager [phone]

## 2021-04-29 NOTE — Progress Notes (Addendum)
Pt confused, axious, constantly setting off bed alarm. On call notified via secure chat st [phone] for something to help her rest. No new orders. Pt pulled out IV, still trying to get up, secure chat sent to on call at [phone]. No new orders. Text page sent at [phone]. See new orders. Pt refusing IM injection, became paranoid and combative. Secure chat sent at [phone] asking for something IV d/t pts refusal. No new orders. Pt started walking in hall refusing to go in room, walking in other pts rooms, yelling/cursing at staff. 2 text pages sent. Security called, got pt back to room, see new orders. Medication given.   It was later discovered by this writer that 5mg  Haldol was given by mistake, when 2mg  was the ordered amount. See vitals in flowsheet, on call [place] notified via text page, husband Maddix Kliewer notified via phone call, safety zone portal completed.

## 2021-04-29 NOTE — Progress Notes (Signed)
PHARMACY - PHYSICIAN COMMUNICATION CRITICAL VALUE ALERT - BLOOD CULTURE IDENTIFICATION (BCID)  Alicia Spencer is an 80 y.o. female who presented to [facility] on 04/27/2021 with a chief complaint of biliary obstruction, Zosyn started for IAI. For ERCP with stent removal  Assessment:  7/8 BCx sent, BCID resulted 7/9 with 1 of 4 bottles growing Kleb oxytoca, current abx will cover  Name of physician (or Provider) Contacted: Dr Jerral Ralph  Current antibiotics: Zosyn EI  Changes to prescribed antibiotics recommended:  Patient is on recommended antibiotics - No changes needed  Results for orders placed or performed during the hospital encounter of 04/27/21  Blood Culture ID Panel (Reflexed) (Collected: 04/28/2021  7:21 AM)  Result Value Ref Range   Enterococcus faecalis NOT DETECTED NOT DETECTED   Enterococcus Faecium NOT DETECTED NOT DETECTED   Listeria monocytogenes NOT DETECTED NOT DETECTED   Staphylococcus species NOT DETECTED NOT DETECTED   Staphylococcus aureus (BCID) NOT DETECTED NOT DETECTED   Staphylococcus epidermidis NOT DETECTED NOT DETECTED   Staphylococcus lugdunensis NOT DETECTED NOT DETECTED   Streptococcus species NOT DETECTED NOT DETECTED   Streptococcus agalactiae NOT DETECTED NOT DETECTED   Streptococcus pneumoniae NOT DETECTED NOT DETECTED   Streptococcus pyogenes NOT DETECTED NOT DETECTED   A.calcoaceticus-baumannii NOT DETECTED NOT DETECTED   Bacteroides fragilis NOT DETECTED NOT DETECTED   Enterobacterales DETECTED (A) NOT DETECTED   Enterobacter cloacae complex NOT DETECTED NOT DETECTED   Escherichia coli NOT DETECTED NOT DETECTED   Klebsiella aerogenes NOT DETECTED NOT DETECTED   Klebsiella oxytoca DETECTED (A) NOT DETECTED   Klebsiella pneumoniae NOT DETECTED NOT DETECTED   Proteus species NOT DETECTED NOT DETECTED   Salmonella species NOT DETECTED NOT DETECTED   Serratia marcescens NOT DETECTED NOT DETECTED   Haemophilus influenzae NOT DETECTED NOT DETECTED    Neisseria meningitidis NOT DETECTED NOT DETECTED   Pseudomonas aeruginosa NOT DETECTED NOT DETECTED   Stenotrophomonas maltophilia NOT DETECTED NOT DETECTED   Candida albicans NOT DETECTED NOT DETECTED   Candida auris NOT DETECTED NOT DETECTED   Candida glabrata NOT DETECTED NOT DETECTED   Candida krusei NOT DETECTED NOT DETECTED   Candida parapsilosis NOT DETECTED NOT DETECTED   Candida tropicalis NOT DETECTED NOT DETECTED   Cryptococcus neoformans/gattii NOT DETECTED NOT DETECTED   CTX-M ESBL NOT DETECTED NOT DETECTED   Carbapenem resistance IMP NOT DETECTED NOT DETECTED   Carbapenem resistance KPC NOT DETECTED NOT DETECTED   Carbapenem resistance NDM NOT DETECTED NOT DETECTED   Carbapenem resist OXA 48 LIKE NOT DETECTED NOT DETECTED   Carbapenem resistance VIM NOT DETECTED NOT DETECTED    Otho Bellows PharmD 04/29/2021  11:30 AM

## 2021-04-30 ENCOUNTER — Encounter (HOSPITAL_COMMUNITY): Payer: Self-pay | Admitting: Internal Medicine

## 2021-04-30 DIAGNOSIS — R7881 Bacteremia: Secondary | ICD-10-CM

## 2021-04-30 LAB — CBC WITH DIFFERENTIAL/PLATELET
Abs Immature Granulocytes: 0.12 10*3/uL — ABNORMAL HIGH (ref 0.00–0.07)
Basophils Absolute: 0 10*3/uL (ref 0.0–0.1)
Basophils Relative: 0 %
Eosinophils Absolute: 0 10*3/uL (ref 0.0–0.5)
Eosinophils Relative: 0 %
HCT: 32.8 % — ABNORMAL LOW (ref 36.0–46.0)
Hemoglobin: 10.6 g/dL — ABNORMAL LOW (ref 12.0–15.0)
Immature Granulocytes: 1 %
Lymphocytes Relative: 5 %
Lymphs Abs: 0.5 10*3/uL — ABNORMAL LOW (ref 0.7–4.0)
MCH: 32.3 pg (ref 26.0–34.0)
MCHC: 32.3 g/dL (ref 30.0–36.0)
MCV: 100 fL (ref 80.0–100.0)
Monocytes Absolute: 0.4 10*3/uL (ref 0.1–1.0)
Monocytes Relative: 3 %
Neutro Abs: 10.7 10*3/uL — ABNORMAL HIGH (ref 1.7–7.7)
Neutrophils Relative %: 91 %
Platelets: 443 10*3/uL — ABNORMAL HIGH (ref 150–400)
RBC: 3.28 MIL/uL — ABNORMAL LOW (ref 3.87–5.11)
RDW: 14.3 % (ref 11.5–15.5)
WBC: 11.7 10*3/uL — ABNORMAL HIGH (ref 4.0–10.5)
nRBC: 0 % (ref 0.0–0.2)

## 2021-04-30 LAB — MAGNESIUM: Magnesium: 1.9 mg/dL (ref 1.7–2.4)

## 2021-04-30 LAB — COMPREHENSIVE METABOLIC PANEL
ALT: 57 U/L — ABNORMAL HIGH (ref 0–44)
AST: 31 U/L (ref 15–41)
Albumin: 3.2 g/dL — ABNORMAL LOW (ref 3.5–5.0)
Alkaline Phosphatase: 451 U/L — ABNORMAL HIGH (ref 38–126)
Anion gap: 10 (ref 5–15)
BUN: 24 mg/dL — ABNORMAL HIGH (ref 8–23)
CO2: 22 mmol/L (ref 22–32)
Calcium: 9 mg/dL (ref 8.9–10.3)
Chloride: 107 mmol/L (ref 98–111)
Creatinine, Ser: 0.66 mg/dL (ref 0.44–1.00)
GFR, Estimated: >60 mL/min (ref >60)
Glucose, Bld: 148 mg/dL — ABNORMAL HIGH (ref 70–99)
Potassium: 4.2 mmol/L (ref 3.5–5.1)
Sodium: 139 mmol/L (ref 135–145)
Total Bilirubin: 3.2 mg/dL — ABNORMAL HIGH (ref 0.3–1.2)
Total Protein: 7 g/dL (ref 6.5–8.1)

## 2021-04-30 MED ORDER — AMOXICILLIN-POT CLAVULANATE 875-125 MG PO TABS: 1.0000 | ORAL_TABLET | Freq: Two times a day (BID) | ORAL | 0 refills | Status: AC

## 2021-04-30 MED ORDER — HALOPERIDOL 2 MG PO TABS
2.0000 mg | ORAL_TABLET | Freq: Three times a day (TID) | ORAL | Status: DC | PRN
  Filled 2021-04-30: qty 1

## 2021-04-30 NOTE — Plan of Care (Signed)

## 2021-04-30 NOTE — H&P (View-Only) (Signed)
HISTORY OF PRESENT ILLNESS:  Alicia Spencer is a 80 y.o. 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  She presents now with stent obstruction.  Underwent ERCP with stent exchange yesterday.  Cytologic brushings pending.  No fevers.  White blood cell count and liver tests improved.  Patient is a bit confused but otherwise looks well.  Quite feisty.  REVIEW OF SYSTEMS:  All non-GI ROS negative.  Past Medical History:  Diagnosis Date   Biliary obstruction 03/2021    Past Surgical History:  Procedure Laterality Date   BILIARY BRUSHING  03/24/2021   Procedure: BILIARY BRUSHING;  Surgeon: [name], MD;  Location: [facility] ENDOSCOPY;  Service: Endoscopy;;   BILIARY STENT PLACEMENT  03/24/2021   Procedure: BILIARY STENT PLACEMENT;  Surgeon: [name], MD;  Location: [facility] ENDOSCOPY;  Service: Endoscopy;;   BIOPSY  03/24/2021   Procedure: BIOPSY;  Surgeon: [name], MD;  Location: [facility] ENDOSCOPY;  Service: Endoscopy;;   ENDOSCOPIC RETROGRADE CHOLANGIOPANCREATOGRAPHY (ERCP) WITH PROPOFOL N/A 03/24/2021   Procedure: ENDOSCOPIC RETROGRADE CHOLANGIOPANCREATOGRAPHY (ERCP) WITH PROPOFOL;  Surgeon: [name], MD;  Location: [facility] ENDOSCOPY;  Service: Endoscopy;  Laterality: N/A;   SPHINCTEROTOMY  03/24/2021   Procedure: SPHINCTEROTOMY;  Surgeon: [name], MD;  Location: [facility] ENDOSCOPY;  Service: Endoscopy;;    Social History Laverta Baltimore  reports that she has been smoking cigarettes. She has never used smokeless tobacco. No history on file for alcohol use and drug use.  family history includes Cancer in her mother; Cancer - Other in her father.  Allergies  Allergen Reactions   Sulfites Anaphylaxis   Iodine Other (See Comments)    welts       PHYSICAL EXAMINATION: Vital signs: BP 136/81 (BP Location: Right Arm)   Pulse 65   Temp 98.3 F (36.8 C) (Oral)   Resp 18   Ht 5\' 2"  (1.575 m)   Wt  49.8 kg   LMP  (LMP Unknown)   SpO2 98%   BMI 20.08 kg/m   Constitutional: generally well-appearing, no acute distress Psychiatric: alert and in a bit confused, cooperative Eyes: extraocular movements intact, anicteric, conjunctiva pink Mouth: oral pharynx moist, no lesions Neck: supple no lymphadenopathy Cardiovascular: heart regular rate and rhythm, no murmur Lungs: clear to auscultation bilaterally Abdomen: soft, nontender, nondistended, no obvious ascites, no peritoneal signs, normal bowel sounds, no organomegaly Rectal: Omitted Extremities: no clubbing, cyanosis, or lower extremity edema bilaterally Skin: no lesions on visible extremities Neuro: No focal deficits.  Cranial nerves intact  ASSESSMENT:  1.  Biliary obstruction secondary to occluded biliary stent.  Status post ERC with brushings of distal stricture and stent replacement.  Doing well clinically without any evidence of cholangitis currently.  Liver tests improved.  White blood cell count improved.  Abdominal exam benign. 2.  Indeterminate distal biliary stricture.  Endoscopic ultrasound planned for July 18 with Dr. July 20 3.  General medical problems   PLAN:  1.  I called the patient's daughter Meridee Score to give her an update 2.  Okay for patient to go home later today 3.  Please send her out on ciprofloxacin 500 mg twice daily for total of 7 days of outpatient therapy.  Of course, make sure that the identified microorganisms are sensitive.  If not, alternative therapy for the same duration. 4.  At my direction, our office will arrange for her to have repeat liver tests  this Wednesday.  Daughter aware Please call for any questions.  We will sign off.  Thanks  Wilhemina Bonito. Eda Keys., M.D. [facility] Division of Gastroenterology

## 2021-04-30 NOTE — Progress Notes (Addendum)
HISTORY OF PRESENT ILLNESS:  Alicia Spencer is a 79 y.o. 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  She presents now with stent obstruction.  Underwent ERCP with stent exchange yesterday.  Cytologic brushings pending.  No fevers.  White blood cell count and liver tests improved.  Patient is a bit confused but otherwise looks well.  Quite feisty.  REVIEW OF SYSTEMS:  All non-GI ROS negative.  Past Medical History:  Diagnosis Date   Biliary obstruction 03/2021    Past Surgical History:  Procedure Laterality Date   BILIARY BRUSHING  03/24/2021   Procedure: BILIARY BRUSHING;  Surgeon: Seham Gardenhire N, MD;  Location: [facility];  Service: Endoscopy;;   BILIARY STENT PLACEMENT  03/24/2021   Procedure: BILIARY STENT PLACEMENT;  Surgeon: Cowen Pesqueira N, MD;  Location: [facility];  Service: Endoscopy;;   BIOPSY  03/24/2021   Procedure: BIOPSY;  Surgeon: Delvonte Berenson N, MD;  Location: [facility];  Service: Endoscopy;;   ENDOSCOPIC RETROGRADE CHOLANGIOPANCREATOGRAPHY (ERCP) WITH PROPOFOL N/A 03/24/2021   Procedure: ENDOSCOPIC RETROGRADE CHOLANGIOPANCREATOGRAPHY (ERCP) WITH PROPOFOL;  Surgeon: Ellagrace Yoshida N, MD;  Location: [facility];  Service: Endoscopy;  Laterality: N/A;   SPHINCTEROTOMY  03/24/2021   Procedure: SPHINCTEROTOMY;  Surgeon: Nestor Wieneke N, MD;  Location: [facility];  Service: Endoscopy;;    Social History [name]  reports that she has been smoking cigarettes. She has never used smokeless tobacco. No history on file for alcohol use and drug use.  family history includes Cancer in her mother; Cancer - Other in her father.  Allergies  Allergen Reactions   Sulfites Anaphylaxis   Iodine Other (See Comments)    welts       PHYSICAL EXAMINATION: Vital signs: BP 136/81 (BP Location: Right Arm)   Pulse 65   Temp 98.3 F (36.8 C) (Oral)   Resp 18   Ht 5' 2" (1.575 m)   Wt  49.8 kg   LMP  (LMP Unknown)   SpO2 98%   BMI 20.08 kg/m   Constitutional: generally well-appearing, no acute distress Psychiatric: alert and in a bit confused, cooperative Eyes: extraocular movements intact, anicteric, conjunctiva pink Mouth: oral pharynx moist, no lesions Neck: supple no lymphadenopathy Cardiovascular: heart regular rate and rhythm, no murmur Lungs: clear to auscultation bilaterally Abdomen: soft, nontender, nondistended, no obvious ascites, no peritoneal signs, normal bowel sounds, no organomegaly Rectal: Omitted Extremities: no clubbing, cyanosis, or lower extremity edema bilaterally Skin: no lesions on visible extremities Neuro: No focal deficits.  Cranial nerves intact  ASSESSMENT:  1.  Biliary obstruction secondary to occluded biliary stent.  Status post ERC with brushings of distal stricture and stent replacement.  Doing well clinically without any evidence of cholangitis currently.  Liver tests improved.  White blood cell count improved.  Abdominal exam benign. 2.  Indeterminate distal biliary stricture.  Endoscopic ultrasound planned for July 18 with Dr. Mansouraty 3.  General medical problems   PLAN:  1.  I called the patient's daughter [name] to give her an update 2.  Okay for patient to go home later today 3.  Please send her out on ciprofloxacin 500 mg twice daily for total of 7 days of outpatient therapy.  Of course, make sure that the identified microorganisms are sensitive.  If not, alternative therapy for the same duration. 4.  At my direction, our office will arrange for her to have repeat liver tests   this Wednesday.  Daughter aware Please call for any questions.  We will sign off.  Thanks  Wilhemina Bonito. Eda Keys., M.D. [facility] of Gastroenterology

## 2021-04-30 NOTE — Progress Notes (Signed)
Discharge instructions given to patient and all questions were answered.  

## 2021-04-30 NOTE — Discharge Summary (Signed)
Physician Discharge Summary  Alicia Spencer QIO:[id number] DOB: [birth year] DOA: 04/27/2021  PCP: [name], MD  Admit date: 04/27/2021 Discharge date: 04/30/2021  Admitted From: Home Disposition: Home  Recommendations for Outpatient Follow-up:  Follow up with PCP in 1-2 weeks Please obtain liver function test in 1 week. Follow-up with GI office as scheduled before.  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  Imaging studies consistent with diffuse dilation of intra and extra hepatic biliary duct.  Admitted with hypokalemia, abnormal LFTs and confusion.  Subsequent blood cultures were positive for Klebsiella.   # Biliary obstruction: Due to biliary stricture.  Previous investigations negative for malignancy. ERCP and bile duct exchange 7/9. Gram-negative bacteremia due to cholangitis, currently on Zosyn.  Changing to Augmentin. LFTs trending down after a stent exchange. Underwent new brush biopsies during procedure.  Results pending.   # Acute metabolic infective encephalopathy:  Patient does have history of cognitive dysfunction and early dementia.  Aggravated by hospitalization and acute infection.   Patient continues to have difficult to control symptoms.  She has worsening confusion and sundowning. Family requested to take her home where she will be in familiar environment and surrounded by family.  She does have previous history of similar episodes on hospitalizations.   # Duodenal ulcer: On PPI.  Healing ulcers according to new endoscopy findings.   # Hypokalemia and hypomagnesemia: Replaced aggressively with  improvement.  Patient is fairly stable but with worsening confusion and sundowning.  Clinically and LFTs improved. Her blood culture was positive for Klebsiella oxytoca , final sensitivities pending. Due to patient's worsening delirium, family requested to discharge home where she will recover in familiar environment. Discharging patient with Augmentin, avoiding fluoroquinolone on elderly patient with altered mentation.  We will follow-up with blood cultures and prescribe antibiotics if not sensitive to penicillins.  Patient has follow-up with gastroenterology, will benefit with rechecking LFTs in 1 week to ensure stabilization.  Discharge Diagnoses:  Principal Problem:   Biliary obstruction Active Problems:   Bile duct stricture   Duodenal ulcer   Elevated LFTs   Hypokalemia   Jaundice   Bacteremia due to Gram-negative bacteria    Discharge Instructions  Discharge Instructions     Call MD for:  persistant nausea and vomiting   Complete by: As directed    Call MD for:  severe uncontrolled pain   Complete by: As directed    Call MD for:  temperature >100.4   Complete by: As directed    Diet - low sodium heart healthy   Complete by: As directed    Increase activity slowly   Complete by: As directed       Allergies as of 04/30/2021       Reactions   Sulfites Anaphylaxis   Iodine Other (See Comments)   welts        Medication List     TAKE these medications    acetaminophen 325 MG tablet Commonly known as: Tylenol Take 1 tablet (325 mg total) by mouth every 6 (six) hours as needed for mild pain or moderate pain.   amoxicillin-clavulanate 875-125 MG tablet Commonly known as: Augmentin Take 1 tablet by mouth 2 (two) times daily for 7 days.  cyclobenzaprine 5 MG tablet Commonly known as: FLEXERIL Take 5 mg by mouth 3 (three) times daily as needed for muscle spasms.   memantine 5 MG tablet Commonly known as: NAMENDA Take 5 mg by mouth 2 (two) times daily.    pantoprazole 40 MG tablet Commonly known as: PROTONIX Take 1 tablet (40 mg total) by mouth 2 (two) times daily.        Follow-up Information     [name], MD Follow up in 1 week(s).   Specialty: Family Medicine Contact information: [address] [place] [address]                Allergies  Allergen Reactions   Sulfites Anaphylaxis   Iodine Other (See Comments)    welts    Consultations: Gastroenterology   Procedures/Studies: CT ABDOMEN PELVIS W CONTRAST  Result Date: 04/27/2021 CLINICAL DATA:  Epigastric pain, elevated liver function studies, and bilirubin. Recent biliary stent. Pain for 2 days. EXAM: CT ABDOMEN AND PELVIS WITH CONTRAST TECHNIQUE: Multidetector CT imaging of the abdomen and pelvis was performed using the standard protocol following bolus administration of intravenous contrast. CONTRAST:  80mL OMNIPAQUE IOHEXOL 350 MG/ML SOLN COMPARISON:  CT 03/22/2021.  MRI 03/23/2021 FINDINGS: Lower chest: Mild dependent atelectasis in the lung bases. Hepatobiliary: Diffusely distended gallbladder with mild pericholecystic edema. Prominent intra and extrahepatic bile duct dilatation. A distal biliary stent is present. Persistence of bile duct dilatation despite stent placement suggest possible stent malfunction. No definite mass lesion. Pancreas: Pancreatic atrophy.  No ductal dilatation or inflammation. Spleen: Normal in size without focal abnormality. Adrenals/Urinary Tract: Adrenal glands are unremarkable. Kidneys are normal, without renal calculi, focal lesion, or hydronephrosis. Bladder is unremarkable. Stomach/Bowel: Stomach, small bowel, and colon are not abnormally distended. No wall thickening or inflammatory changes. Appendix is not identified. Vascular/Lymphatic: Aortic atherosclerosis. No enlarged abdominal or pelvic lymph nodes. Reproductive: Uterine calcification consistent with fibroid. No abnormal adnexal masses. Other: No free air or free fluid in  the abdomen. Abdominal wall musculature appears intact. Musculoskeletal: Spondylolysis with mild spondylolisthesis at L4-5. Mild degenerative changes in the spine. Postoperative fixation of the right hip with degenerative change in the right hip. IMPRESSION: 1. Persistent intra and extrahepatic bile duct dilatation and gallbladder distension with a biliary stent present. Persistence of bile duct dilatation suggest possible stent malfunction. No mass or stone identified. 2. Pancreatic atrophy. No inflammatory changes or ductal dilatation demonstrated. No focal mass lesions identified. 3. Aortic atherosclerosis. 4. Calcified uterine fibroids. Electronically Signed   By: Burman Nieves M.D.   On: 04/27/2021 23:09   DG ERCP  Result Date: 04/29/2021 CLINICAL DATA:  Biliary stent exchange. EXAM: ERCP TECHNIQUE: Multiple spot images obtained with the fluoroscopic device and submitted for interpretation post-procedure. COMPARISON:  ERCP-03/24/2021 FLUOROSCOPY TIME:  5 minutes, 4 seconds (89.4 mGy) FINDINGS: 12 spot intraoperative fluoroscopic images of the right upper abdominal quadrant during ERCP provided for review. Initial image demonstrates an ERCP probe overlying the right upper abdominal quadrant. There has been removal of the pre-existing internal biliary stent. Subsequent images demonstrate selective cannulation and opacification of the common bile duct which appears markedly dilated. There is opacification of the intrahepatic biliary tree which appears at least moderately dilated. Additional radiopaque wire overlies the right upper abdominal quadrant, potentially external to the patient. There is abrupt narrowing/occlusion involving the distal aspect of the CBD. There is no definitive opacification of either the cystic or pancreatic ducts. Completion images demonstrate placement of an internal biliary stent  overlying expected location of the CBD. IMPRESSION: ERCP with biliary stent exchange. These images were  submitted for radiologic interpretation only. Please see the procedural report for the amount of contrast and the fluoroscopy time utilized. Electronically Signed   By: Simonne Come M.D.   On: 04/29/2021 11:36   (Echo, Carotid, EGD, Colonoscopy, ERCP)    Subjective: Patient seen and examined.  In the morning rounds, patient herself denied any complaints but continues to remain confused. By later evening, patient continues to be getting out of the bed, confused.  Able to eat well and denies any nausea vomiting or abdominal pain.  Due to her ongoing symptoms, her daughter arrived and requested to take her home.   Discharge Exam: Vitals:   04/29/21 2227 04/30/21 0621  BP: (!) 129/97 136/81  Pulse: (!) 58 65  Resp: 18 18  Temp: 97.6 F (36.4 C) 98.3 F (36.8 C)  SpO2: 100% 98%   Vitals:   04/29/21 1215 04/29/21 1405 04/29/21 2227 04/30/21 0621  BP: 122/85 (!) 145/90 (!) 129/97 136/81  Pulse: 68 (!) 59 (!) 58 65  Resp: 16 20 18 18   Temp:   97.6 F (36.4 C) 98.3 F (36.8 C)  TempSrc:   Oral Oral  SpO2:  93% 100% 98%  Weight:      Height:        General: Pt is alert and awake but not oriented.  Looks comfortable.  She is pleasant but confused. Cardiovascular: RRR, S1/S2 +, no rubs, no gallops Respiratory: CTA bilaterally, no wheezing, no rhonchi Abdominal: Soft, NT, ND, bowel sounds + Extremities: no edema, no cyanosis    The results of significant diagnostics from this hospitalization (including imaging, microbiology, ancillary and laboratory) are listed below for reference.     Microbiology: Recent Results (from the past 240 hour(s))  Resp Panel by RT-PCR (Flu A&B, Covid) Nasopharyngeal Swab     Status: None   Collection Time: 04/27/21 10:40 PM   Specimen: Nasopharyngeal Swab; Nasopharyngeal(NP) swabs in vial transport medium  Result Value Ref Range Status   SARS Coronavirus 2 by RT PCR NEGATIVE NEGATIVE Final    Comment: (NOTE) SARS-CoV-2 target nucleic acids are NOT  DETECTED.  The SARS-CoV-2 RNA is generally detectable in upper respiratory specimens during the acute phase of infection. The lowest concentration of SARS-CoV-2 viral copies this assay can detect is 138 copies/mL. A negative result does not preclude SARS-Cov-2 infection and should not be used as the sole basis for treatment or other patient management decisions. A negative result may occur with  improper specimen collection/handling, submission of specimen other than nasopharyngeal swab, presence of viral mutation(s) within the areas targeted by this assay, and inadequate number of viral copies(<138 copies/mL). A negative result must be combined with clinical observations, patient history, and epidemiological information. The expected result is Negative.  Fact Sheet for Patients:  06/28/21  Fact Sheet for Healthcare Providers:  BloggerCourse.com  This test is no t yet approved or cleared by the [place] FDA and  has been authorized for detection and/or diagnosis of SARS-CoV-2 by FDA under an Emergency Use Authorization (EUA). This EUA will remain  in effect (meaning this test can be used) for the duration of the COVID-19 declaration under Section 564(b)(1) of the Act, 21 U.S.C.section 360bbb-3(b)(1), unless the authorization is terminated  or revoked sooner.       Influenza A by PCR NEGATIVE NEGATIVE Final   Influenza B by PCR NEGATIVE NEGATIVE Final    Comment: (NOTE)  The Xpert Xpress SARS-CoV-2/FLU/RSV plus assay is intended as an aid in the diagnosis of influenza from Nasopharyngeal swab specimens and should not be used as a sole basis for treatment. Nasal washings and aspirates are unacceptable for Xpert Xpress SARS-CoV-2/FLU/RSV testing.  Fact Sheet for Patients: BloggerCourse.com  Fact Sheet for Healthcare Providers: SeriousBroker.it  This test is not yet  approved or cleared by the [place] FDA and has been authorized for detection and/or diagnosis of SARS-CoV-2 by FDA under an Emergency Use Authorization (EUA). This EUA will remain in effect (meaning this test can be used) for the duration of the COVID-19 declaration under Section 564(b)(1) of the Act, 21 U.S.C. section 360bbb-3(b)(1), unless the authorization is terminated or revoked.  Performed at [facility], [address], [place], [place] [address]   Culture, blood (routine x 2)     Status: None (Preliminary result)   Collection Time: 04/28/21  7:10 AM   Specimen: BLOOD  Result Value Ref Range Status   Specimen Description   Final    BLOOD RIGHT ANTECUBITAL Performed at [facility], [address] [place] [address], [place], [place] [address]    Special Requests   Final    BOTTLES DRAWN AEROBIC AND ANAEROBIC Blood Culture adequate volume Performed at [facility], [address], [place], [place] [address]    Culture   Final    NO GROWTH 2 DAYS Performed at [facility], [address], [place], [place] [address]    Report Status PENDING  Incomplete  Culture, blood (routine x 2)     Status: Abnormal (Preliminary result)   Collection Time: 04/28/21  7:21 AM   Specimen: BLOOD  Result Value Ref Range Status   Specimen Description   Final    BLOOD LEFT ANTECUBITAL Performed at [facility], [address] [place], [place] [address]    Special Requests   Final    BOTTLES DRAWN AEROBIC AND ANAEROBIC Blood Culture adequate volume Performed at [facility], [address], [place], [place] [address]    Culture  Setup Time   Final    GRAM NEGATIVE RODS AEROBIC BOTTLE ONLY CRITICAL RESULT CALLED TO, READ BACK BY AND VERIFIED WITH: TERRI GREEN PHARMD @1122  04/29/21 EB    Culture (A)  Final    KLEBSIELLA OXYTOCA SUSCEPTIBILITIES TO FOLLOW Performed at [facility], [address], [place], [place]    Report Status PENDING  Incomplete  Blood Culture ID Panel (Reflexed)     Status: Abnormal   Collection Time: 04/28/21  7:21 AM  Result Value Ref Range Status   Enterococcus faecalis NOT DETECTED NOT DETECTED Final   Enterococcus Faecium NOT DETECTED NOT DETECTED Final   Listeria monocytogenes NOT DETECTED NOT DETECTED Final   Staphylococcus species NOT DETECTED NOT DETECTED Final   Staphylococcus aureus (BCID) NOT DETECTED NOT DETECTED Final   Staphylococcus epidermidis NOT DETECTED NOT DETECTED Final   Staphylococcus lugdunensis NOT DETECTED NOT DETECTED Final   Streptococcus species NOT DETECTED NOT DETECTED Final   Streptococcus agalactiae NOT DETECTED NOT DETECTED Final   Streptococcus pneumoniae NOT DETECTED NOT DETECTED Final   Streptococcus pyogenes NOT DETECTED NOT DETECTED Final   A.calcoaceticus-baumannii NOT DETECTED NOT DETECTED Final   Bacteroides fragilis NOT DETECTED NOT DETECTED Final   Enterobacterales DETECTED (A) NOT DETECTED Final    Comment: Enterobacterales represent a large order of gram negative bacteria, not a single organism. CRITICAL RESULT CALLED TO,  READ BACK BY AND VERIFIED WITH: TERRI GREEN PHARMD  04/29/21 EB    Enterobacter cloacae complex NOT DETECTED NOT DETECTED Final   Escherichia coli NOT DETECTED NOT DETECTED Final   Klebsiella aerogenes NOT DETECTED NOT DETECTED Final   Klebsiella oxytoca DETECTED (A) NOT DETECTED Final    Comment: CRITICAL RESULT CALLED TO, READ BACK BY AND VERIFIED WITH: TERRI GREEN PHARMD  04/29/21 EB    Klebsiella pneumoniae NOT DETECTED NOT DETECTED Final   Proteus species NOT DETECTED NOT DETECTED Final   Salmonella species NOT DETECTED NOT DETECTED Final   Serratia marcescens NOT DETECTED NOT DETECTED Final   Haemophilus influenzae NOT DETECTED NOT DETECTED Final   Neisseria meningitidis NOT DETECTED NOT DETECTED Final   Pseudomonas aeruginosa NOT DETECTED NOT DETECTED Final    Stenotrophomonas maltophilia NOT DETECTED NOT DETECTED Final   Candida albicans NOT DETECTED NOT DETECTED Final   Candida auris NOT DETECTED NOT DETECTED Final   Candida glabrata NOT DETECTED NOT DETECTED Final   Candida krusei NOT DETECTED NOT DETECTED Final   Candida parapsilosis NOT DETECTED NOT DETECTED Final   Candida tropicalis NOT DETECTED NOT DETECTED Final   Cryptococcus neoformans/gattii NOT DETECTED NOT DETECTED Final   CTX-M ESBL NOT DETECTED NOT DETECTED Final   Carbapenem resistance IMP NOT DETECTED NOT DETECTED Final   Carbapenem resistance KPC NOT DETECTED NOT DETECTED Final   Carbapenem resistance NDM NOT DETECTED NOT DETECTED Final   Carbapenem resist OXA 48 LIKE NOT DETECTED NOT DETECTED Final   Carbapenem resistance VIM NOT DETECTED NOT DETECTED Final    Comment: Performed at [facility], [address] [place], [place] [address]     Labs: BNP (last 3 results) No results for input(s): BNP in the last 8760 hours. Basic Metabolic Panel: Recent Labs  Lab 04/27/21 2030 04/28/21 0706 04/28/21 1443 04/30/21 0423  NA 141 142 140 139  K 2.7* 3.3* 3.7 4.2  CL 103 106 111 107  CO2 GLUCOSE 189* 144* 129* 148*  BUN 24*  CREATININE 0.42* 0.51 0.43* 0.66  CALCIUM 10.1 9.6 8.7* 9.0  MG 1.5* 2.1  --  1.9   Liver Function Tests: Recent Labs  Lab 04/27/21 2030 04/28/21 0706 04/28/21 1443 04/30/21 0423  AST 114* 88* 59* 31  ALT 105* 97* 77* 57*  ALKPHOS 540* 516* 433* 451*  BILITOT 4.5* 5.3* 5.0* 3.2*  PROT 7.4 7.2 6.1* 7.0  ALBUMIN 3.6 3.4* 2.8* 3.2*   Recent Labs  Lab 04/27/21 2030  LIPASE 22   Recent Labs  Lab 04/28/21 0706  AMMONIA 41*   CBC: Recent Labs  Lab 04/27/21 2030 04/28/21 0706 04/29/21 0410 04/30/21 0423  WBC 15.3* 19.7* 14.0* 11.7*  NEUTROABS 14.2*  --   --  10.7*  HGB 11.4* 11.2* 9.7* 10.6*  HCT 34.1* 34.4* 29.0* 32.8*  MCV 97.7 100.6* 98.3 100.0  PLT 485* 415* 327 443*   Cardiac Enzymes: No  results for input(s): CKTOTAL, CKMB, CKMBINDEX, TROPONINI in the last 168 hours. BNP: Invalid input(s): POCBNP CBG: No results for input(s): GLUCAP in the last 168 hours. D-Dimer No results for input(s): DDIMER in the last 72 hours. Hgb A1c No results for input(s): HGBA1C in the last 72 hours. Lipid Profile No results for input(s): CHOL, HDL, LDLCALC, TRIG, CHOLHDL, LDLDIRECT in the last 72 hours. Thyroid function studies No results for input(s): TSH, T4TOTAL, T3FREE, THYROIDAB in the last 72 hours.  Invalid input(s): FREET3 Anemia work  up No results for input(s): VITAMINB12, FOLATE, FERRITIN, TIBC, IRON, RETICCTPCT in the last 72 hours. Urinalysis    Component Value Date/Time   COLORURINE AMBER (A) 03/22/2021 2019   APPEARANCEUR HAZY (A) 03/22/2021 2019   LABSPEC 1.013 03/22/2021 2019   PHURINE 5.0 03/22/2021 2019   GLUCOSEU NEGATIVE 03/22/2021 2019   HGBUR NEGATIVE 03/22/2021 2019   BILIRUBINUR MODERATE (A) 03/22/2021 2019   KETONESUR 5 (A) 03/22/2021 2019   PROTEINUR NEGATIVE 03/22/2021 2019   NITRITE NEGATIVE 03/22/2021 2019   LEUKOCYTESUR TRACE (A) 03/22/2021 2019   Sepsis Labs Invalid input(s): PROCALCITONIN,  WBC,  LACTICIDVEN Microbiology Recent Results (from the past 240 hour(s))  Resp Panel by RT-PCR (Flu A&B, Covid) Nasopharyngeal Swab     Status: None   Collection Time: 04/27/21 10:40 PM   Specimen: Nasopharyngeal Swab; Nasopharyngeal(NP) swabs in vial transport medium  Result Value Ref Range Status   SARS Coronavirus 2 by RT PCR NEGATIVE NEGATIVE Final    Comment: (NOTE) SARS-CoV-2 target nucleic acids are NOT DETECTED.  The SARS-CoV-2 RNA is generally detectable in upper respiratory specimens during the acute phase of infection. The lowest concentration of SARS-CoV-2 viral copies this assay can detect is 138 copies/mL. A negative result does not preclude SARS-Cov-2 infection and should not be used as the sole basis for treatment or other patient  management decisions. A negative result may occur with  improper specimen collection/handling, submission of specimen other than nasopharyngeal swab, presence of viral mutation(s) within the areas targeted by this assay, and inadequate number of viral copies(<138 copies/mL). A negative result must be combined with clinical observations, patient history, and epidemiological information. The expected result is Negative.  Fact Sheet for Patients:  BloggerCourse.comhttps://www.fda.gov/media/152166/download  Fact Sheet for Healthcare Providers:  SeriousBroker.ithttps://www.fda.gov/media/152162/download  This test is no t yet approved or cleared by the [place] FDA and  has been authorized for detection and/or diagnosis of SARS-CoV-2 by FDA under an Emergency Use Authorization (EUA). This EUA will remain  in effect (meaning this test can be used) for the duration of the COVID-19 declaration under Section 564(b)(1) of the Act, 21 U.S.C.section 360bbb-3(b)(1), unless the authorization is terminated  or revoked sooner.       Influenza A by PCR NEGATIVE NEGATIVE Final   Influenza B by PCR NEGATIVE NEGATIVE Final    Comment: (NOTE) The Xpert Xpress SARS-CoV-2/FLU/RSV plus assay is intended as an aid in the diagnosis of influenza from Nasopharyngeal swab specimens and should not be used as a sole basis for treatment. Nasal washings and aspirates are unacceptable for Xpert Xpress SARS-CoV-2/FLU/RSV testing.  Fact Sheet for Patients: BloggerCourse.comhttps://www.fda.gov/media/152166/download  Fact Sheet for Healthcare Providers: SeriousBroker.ithttps://www.fda.gov/media/152162/download  This test is not yet approved or cleared by the [place] FDA and has been authorized for detection and/or diagnosis of SARS-CoV-2 by FDA under an Emergency Use Authorization (EUA). This EUA will remain in effect (meaning this test can be used) for the duration of the COVID-19 declaration under Section 564(b)(1) of the Act, 21 U.S.C. section 360bbb-3(b)(1),  unless the authorization is terminated or revoked.  Performed at [facility], [address] [place], [place] [address]   Culture, blood (routine x 2)     Status: None (Preliminary result)   Collection Time: 04/28/21  7:10 AM   Specimen: BLOOD  Result Value Ref Range Status   Specimen Description   Final    BLOOD RIGHT ANTECUBITAL Performed at [facility], [address] [place], [place] [address]  Special Requests   Final    BOTTLES DRAWN AEROBIC AND ANAEROBIC Blood Culture adequate volume Performed at [facility], [address] [place], [place] [address]    Culture   Final    NO GROWTH 2 DAYS Performed at [facility], [address], [place], [place] [address]    Report Status PENDING  Incomplete  Culture, blood (routine x 2)     Status: Abnormal (Preliminary result)   Collection Time: 04/28/21  7:21 AM   Specimen: BLOOD  Result Value Ref Range Status   Specimen Description   Final    BLOOD LEFT ANTECUBITAL Performed at [facility], [address], [place], [place] [address]    Special Requests   Final    BOTTLES DRAWN AEROBIC AND ANAEROBIC Blood Culture adequate volume Performed at [facility], [address], [place], [place] [address]    Culture  Setup Time   Final    GRAM NEGATIVE RODS AEROBIC BOTTLE ONLY CRITICAL RESULT CALLED TO, READ BACK BY AND VERIFIED WITH: TERRI GREEN PHARMD  04/29/21 EB    Culture (A)  Final    KLEBSIELLA OXYTOCA SUSCEPTIBILITIES TO FOLLOW Performed at [facility], [address], [place], [place] [address]    Report Status PENDING  Incomplete  Blood Culture ID Panel (Reflexed)     Status: Abnormal   Collection Time: 04/28/21  7:21 AM  Result Value Ref Range Status   Enterococcus faecalis NOT DETECTED NOT DETECTED Final   Enterococcus Faecium NOT DETECTED NOT DETECTED Final   Listeria monocytogenes NOT DETECTED  NOT DETECTED Final   Staphylococcus species NOT DETECTED NOT DETECTED Final   Staphylococcus aureus (BCID) NOT DETECTED NOT DETECTED Final   Staphylococcus epidermidis NOT DETECTED NOT DETECTED Final   Staphylococcus lugdunensis NOT DETECTED NOT DETECTED Final   Streptococcus species NOT DETECTED NOT DETECTED Final   Streptococcus agalactiae NOT DETECTED NOT DETECTED Final   Streptococcus pneumoniae NOT DETECTED NOT DETECTED Final   Streptococcus pyogenes NOT DETECTED NOT DETECTED Final   A.calcoaceticus-baumannii NOT DETECTED NOT DETECTED Final   Bacteroides fragilis NOT DETECTED NOT DETECTED Final   Enterobacterales DETECTED (A) NOT DETECTED Final    Comment: Enterobacterales represent a large order of gram negative bacteria, not a single organism. CRITICAL RESULT CALLED TO, READ BACK BY AND VERIFIED WITH: TERRI GREEN PHARMD  04/29/21 EB    Enterobacter cloacae complex NOT DETECTED NOT DETECTED Final   Escherichia coli NOT DETECTED NOT DETECTED Final   Klebsiella aerogenes NOT DETECTED NOT DETECTED Final   Klebsiella oxytoca DETECTED (A) NOT DETECTED Final    Comment: CRITICAL RESULT CALLED TO, READ BACK BY AND VERIFIED WITH: TERRI GREEN PHARMD  04/29/21 EB    Klebsiella pneumoniae NOT DETECTED NOT DETECTED Final   Proteus species NOT DETECTED NOT DETECTED Final   Salmonella species NOT DETECTED NOT DETECTED Final   Serratia marcescens NOT DETECTED NOT DETECTED Final   Haemophilus influenzae NOT DETECTED NOT DETECTED Final   Neisseria meningitidis NOT DETECTED NOT DETECTED Final   Pseudomonas aeruginosa NOT DETECTED NOT DETECTED Final   Stenotrophomonas maltophilia NOT DETECTED NOT DETECTED Final   Candida albicans NOT DETECTED NOT DETECTED Final   Candida auris NOT DETECTED NOT DETECTED Final   Candida glabrata NOT DETECTED NOT DETECTED Final   Candida krusei NOT DETECTED NOT DETECTED Final   Candida parapsilosis NOT DETECTED NOT DETECTED Final   Candida tropicalis NOT  DETECTED NOT DETECTED Final  Cryptococcus neoformans/gattii NOT DETECTED NOT DETECTED Final   CTX-M ESBL NOT DETECTED NOT DETECTED Final   Carbapenem resistance IMP NOT DETECTED NOT DETECTED Final   Carbapenem resistance KPC NOT DETECTED NOT DETECTED Final   Carbapenem resistance NDM NOT DETECTED NOT DETECTED Final   Carbapenem resist OXA 48 LIKE NOT DETECTED NOT DETECTED Final   Carbapenem resistance VIM NOT DETECTED NOT DETECTED Final    Comment: Performed at [facility], [address] [place], [place] [address]     Time coordinating discharge:  35 minutes  SIGNED:   Dorcas Carrow, MD  [facility] 04/30/2021, 3:35 PM

## 2021-04-30 NOTE — Progress Notes (Signed)
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  Imaging studies consistent with diffuse dilation of intra and extra hepatic biliary duct.  Admitted with hypokalemia, abnormal LFTs and confusion. Blood cultures positive for Klebsiella.   Assessment & Plan:   Principal Problem:   Biliary obstruction Active Problems:   Bile duct stricture   Duodenal ulcer   Elevated LFTs   Hypokalemia   Jaundice   Bacteremia due to Gram-negative bacteria  Biliary obstruction: Due to biliary stricture.  Previous investigations negative for malignancy. ERCP and bile duct exchange 7/9. Gram-negative bacteremia due to cholangitis, currently on Zosyn. Continue antibiotics until final cultures.  Will change to oral antibiotics once culture reports available. LFTs trending down after a stent exchange. Underwent new brush biopsies during procedure.  Results pending.  Acute metabolic infective encephalopathy:  Patient does have a history of cognitive dysfunction and early dementia.  Aggravated by hospitalization and acute infection.  All-time fall precautions.  Delirium precautions.  Will use low-dose Haldol.  Discharge home as soon as possible once culture sensitivity reports available.    Duodenal ulcer: On PPI.  Healing ulcers according to new endoscopy findings.  Hypokalemia and hypomagnesemia: Replaced aggressively with improvement.    DVT prophylaxis: SCDs Start: 04/28/21 0640   Code Status: Full code Family Communication: Daughter on the phone. Disposition Plan: Status is: Inpatient  Remains inpatient appropriate because:IV treatments appropriate due to intensity of illness or inability to take PO and  Inpatient level of care appropriate due to severity of illness  Dispo: The patient is from: Home              Anticipated d/c is to: Home              Patient currently is not medically stable to d/c.   Difficult to place patient No         Consultants:  Gastroenterology  Procedures:  ERCP 7/9  Antimicrobials:  Zosyn 7/8----   Subjective: Patient seen and examined.  Overnight confused.  She is confused but not delirious.  She thinks she is in high school.  No family at the bedside.  Difficult to reorient.  Remains afebrile.  Denies any nausea vomiting or abdominal pain.  She was able to eat regular diet without trouble.  Objective: Vitals:   04/29/21 1215 04/29/21 1405 04/29/21 2227 04/30/21 0621  BP: 122/85 (!) 145/90 (!) 129/97 136/81  Pulse: 68 (!) 59 (!) 58 65  Resp: 16 20 18 18   Temp:   97.6 F (36.4 C) 98.3 F (36.8 C)  TempSrc:   Oral Oral  SpO2:  93% 100% 98%  Weight:      Height:        Intake/Output Summary (Last 24 hours) at 04/30/2021 1112 Last data filed at 04/30/2021 1000 Gross per 24 hour  Intake 920 ml  Output 551 ml  Net 369 ml   Filed Weights   04/28/21 0102  Weight: 49.8 kg    Examination:  General exam: Appears calm and comfortable at rest. Patient is pleasant but confused.  On room air.  Icteric. Respiratory system: Clear to auscultation. Respiratory effort normal. Cardiovascular system: S1 & S2 heard, RRR. No JVD, murmurs, rubs, gallops or clicks. No  pedal edema. Gastrointestinal system: Soft and nontender.   Central nervous system: Alert and oriented x1-2.  No focal neurological deficits. Extremities: Symmetric 5 x 5 power. Skin: No rashes, lesions or ulcers Psychiatry: Judgement and insight appear compromised.   Data Reviewed: I have personally reviewed following labs and imaging studies  CBC: Recent Labs  Lab 04/27/21 2030 04/28/21 0706 04/29/21 0410 04/30/21 0423  WBC 15.3* 19.7* 14.0* 11.7*  NEUTROABS 14.2*  --   --   10.7*  HGB 11.4* 11.2* 9.7* 10.6*  HCT 34.1* 34.4* 29.0* 32.8*  MCV 97.7 100.6* 98.3 100.0  PLT 485* 415* 327 443*   Basic Metabolic Panel: Recent Labs  Lab 04/27/21 2030 04/28/21 0706 04/28/21 1443 04/30/21 0423  NA 141 142 140 139  K 2.7* 3.3* 3.7 4.2  CL 103 106 111 107  CO2 25 26 23 22   GLUCOSE 189* 144* 129* 148*  BUN 11 10 10  24*  CREATININE 0.42* 0.51 0.43* 0.66  CALCIUM 10.1 9.6 8.7* 9.0  MG 1.5* 2.1  --  1.9   GFR: Estimated Creatinine Clearance: 46.3 mL/min (by C-G formula based on SCr of 0.66 mg/dL). Liver Function Tests: Recent Labs  Lab 04/27/21 2030 04/28/21 0706 04/28/21 1443 04/30/21 0423  AST 114* 88* 59* 31  ALT 105* 97* 77* 57*  ALKPHOS 540* 516* 433* 451*  BILITOT 4.5* 5.3* 5.0* 3.2*  PROT 7.4 7.2 6.1* 7.0  ALBUMIN 3.6 3.4* 2.8* 3.2*   Recent Labs  Lab 04/27/21 2030  LIPASE 22   Recent Labs  Lab 04/28/21 0706  AMMONIA 41*   Coagulation Profile: No results for input(s): INR, PROTIME in the last 168 hours. Cardiac Enzymes: No results for input(s): CKTOTAL, CKMB, CKMBINDEX, TROPONINI in the last 168 hours. BNP (last 3 results) No results for input(s): PROBNP in the last 8760 hours. HbA1C: No results for input(s): HGBA1C in the last 72 hours. CBG: No results for input(s): GLUCAP in the last 168 hours. Lipid Profile: No results for input(s): CHOL, HDL, LDLCALC, TRIG, CHOLHDL, LDLDIRECT in the last 72 hours. Thyroid Function Tests: No results for input(s): TSH, T4TOTAL, FREET4, T3FREE, THYROIDAB in the last 72 hours. Anemia Panel: No results for input(s): VITAMINB12, FOLATE, FERRITIN, TIBC, IRON, RETICCTPCT in the last 72 hours. Sepsis Labs: Recent Labs  Lab 04/28/21 0706  LATICACIDVEN 1.0    Recent Results (from the past 240 hour(s))  Resp Panel by RT-PCR (Flu A&B, Covid) Nasopharyngeal Swab     Status: None   Collection Time: 04/27/21 10:40 PM   Specimen: Nasopharyngeal Swab; Nasopharyngeal(NP) swabs in vial transport medium   Result Value Ref Range Status   SARS Coronavirus 2 by RT PCR NEGATIVE NEGATIVE Final    Comment: (NOTE) SARS-CoV-2 target nucleic acids are NOT DETECTED.  The SARS-CoV-2 RNA is generally detectable in upper respiratory specimens during the acute phase of infection. The lowest concentration of SARS-CoV-2 viral copies this assay can detect is 138 copies/mL. A negative result does not preclude SARS-Cov-2 infection and should not be used as the sole basis for treatment or other patient management decisions. A negative result may occur with  improper specimen collection/handling, submission of specimen other than nasopharyngeal swab, presence of viral mutation(s) within the areas targeted by this assay, and inadequate number of viral copies(<138 copies/mL). A negative result must be combined with clinical observations, patient history, and epidemiological information. The expected result is Negative.  Fact Sheet for Patients:  0707  Fact Sheet for Healthcare Providers:  06/28/21  This test is  no t yet approved or cleared by the [place] and  has been authorized for detection and/or diagnosis of SARS-CoV-2 by FDA under an Emergency Use Authorization (EUA). This EUA will remain  in effect (meaning this test can be used) for the duration of the COVID-19 declaration under Section 564(b)(1) of the Act, 21 U.S.C.section 360bbb-3(b)(1), unless the authorization is terminated  or revoked sooner.       Influenza A by PCR NEGATIVE NEGATIVE Final   Influenza B by PCR NEGATIVE NEGATIVE Final    Comment: (NOTE) The Xpert Xpress SARS-CoV-2/FLU/RSV plus assay is intended as an aid in the diagnosis of influenza from Nasopharyngeal swab specimens and should not be used as a sole basis for treatment. Nasal washings and aspirates are unacceptable for Xpert Xpress SARS-CoV-2/FLU/RSV testing.  Fact Sheet for  Patients: BloggerCourse.com  Fact Sheet for Healthcare Providers: SeriousBroker.it  This test is not yet approved or cleared by the [place] FDA and has been authorized for detection and/or diagnosis of SARS-CoV-2 by FDA under an Emergency Use Authorization (EUA). This EUA will remain in effect (meaning this test can be used) for the duration of the COVID-19 declaration under Section 564(b)(1) of the Act, 21 U.S.C. section 360bbb-3(b)(1), unless the authorization is terminated or revoked.  Performed at [facility], [address], [place], [place] [address]   Culture, blood (routine x 2)     Status: None (Preliminary result)   Collection Time: 04/28/21  7:10 AM   Specimen: BLOOD  Result Value Ref Range Status   Specimen Description   Final    BLOOD RIGHT ANTECUBITAL Performed at [facility], [address] [place], [place] [address]    Special Requests   Final    BOTTLES DRAWN AEROBIC AND ANAEROBIC Blood Culture adequate volume Performed at [facility], [address], [place], [place] [address]    Culture   Final    NO GROWTH 2 DAYS Performed at [facility], [address], [place], [place] [address]    Report Status PENDING  Incomplete  Culture, blood (routine x 2)     Status: Abnormal (Preliminary result)   Collection Time: 04/28/21  7:21 AM   Specimen: BLOOD  Result Value Ref Range Status   Specimen Description   Final    BLOOD LEFT ANTECUBITAL Performed at [facility], [address], [place], [place] [address]    Special Requests   Final    BOTTLES DRAWN AEROBIC AND ANAEROBIC Blood Culture adequate volume Performed at [facility], [address] [place], [place] [address]    Culture  Setup Time   Final    GRAM NEGATIVE RODS AEROBIC BOTTLE ONLY CRITICAL RESULT CALLED TO, READ BACK BY AND VERIFIED  WITH: TERRI GREEN PHARMD  04/29/21 EB    Culture (A)  Final    KLEBSIELLA OXYTOCA SUSCEPTIBILITIES TO FOLLOW Performed at [facility], [address] [place], [place] [address]    Report Status PENDING  Incomplete  Blood Culture ID Panel (Reflexed)     Status: Abnormal   Collection Time: 04/28/21  7:21 AM  Result Value Ref Range Status   Enterococcus faecalis NOT DETECTED NOT DETECTED Final   Enterococcus Faecium NOT DETECTED NOT DETECTED Final   Listeria monocytogenes NOT DETECTED NOT DETECTED Final   Staphylococcus species NOT DETECTED NOT DETECTED Final   Staphylococcus aureus (BCID) NOT DETECTED NOT DETECTED Final   Staphylococcus epidermidis NOT  DETECTED NOT DETECTED Final   Staphylococcus lugdunensis NOT DETECTED NOT DETECTED Final   Streptococcus species NOT DETECTED NOT DETECTED Final   Streptococcus agalactiae NOT DETECTED NOT DETECTED Final   Streptococcus pneumoniae NOT DETECTED NOT DETECTED Final   Streptococcus pyogenes NOT DETECTED NOT DETECTED Final   A.calcoaceticus-baumannii NOT DETECTED NOT DETECTED Final   Bacteroides fragilis NOT DETECTED NOT DETECTED Final   Enterobacterales DETECTED (A) NOT DETECTED Final    Comment: Enterobacterales represent a large order of gram negative bacteria, not a single organism. CRITICAL RESULT CALLED TO, READ BACK BY AND VERIFIED WITH: TERRI GREEN PHARMD @1122  04/29/21 EB    Enterobacter cloacae complex NOT DETECTED NOT DETECTED Final   Escherichia coli NOT DETECTED NOT DETECTED Final   Klebsiella aerogenes NOT DETECTED NOT DETECTED Final   Klebsiella oxytoca DETECTED (A) NOT DETECTED Final    Comment: CRITICAL RESULT CALLED TO, READ BACK BY AND VERIFIED WITH: TERRI GREEN PHARMD @1122  04/29/21 EB    Klebsiella pneumoniae NOT DETECTED NOT DETECTED Final   Proteus species NOT DETECTED NOT DETECTED Final   Salmonella species NOT DETECTED NOT DETECTED Final   Serratia marcescens NOT DETECTED NOT DETECTED Final    Haemophilus influenzae NOT DETECTED NOT DETECTED Final   Neisseria meningitidis NOT DETECTED NOT DETECTED Final   Pseudomonas aeruginosa NOT DETECTED NOT DETECTED Final   Stenotrophomonas maltophilia NOT DETECTED NOT DETECTED Final   Candida albicans NOT DETECTED NOT DETECTED Final   Candida auris NOT DETECTED NOT DETECTED Final   Candida glabrata NOT DETECTED NOT DETECTED Final   Candida krusei NOT DETECTED NOT DETECTED Final   Candida parapsilosis NOT DETECTED NOT DETECTED Final   Candida tropicalis NOT DETECTED NOT DETECTED Final   Cryptococcus neoformans/gattii NOT DETECTED NOT DETECTED Final   CTX-M ESBL NOT DETECTED NOT DETECTED Final   Carbapenem resistance IMP NOT DETECTED NOT DETECTED Final   Carbapenem resistance KPC NOT DETECTED NOT DETECTED Final   Carbapenem resistance NDM NOT DETECTED NOT DETECTED Final   Carbapenem resist OXA 48 LIKE NOT DETECTED NOT DETECTED Final   Carbapenem resistance VIM NOT DETECTED NOT DETECTED Final    Comment: Performed at [facility], [address], [place], [address] [place]         Radiology Studies: DG ERCP  Result Date: 04/29/2021 CLINICAL DATA:  Biliary stent exchange. EXAM: ERCP TECHNIQUE: Multiple spot images obtained with the fluoroscopic device and submitted for interpretation post-procedure. COMPARISON:  ERCP-03/24/2021 FLUOROSCOPY TIME:  5 minutes, 4 seconds (89.4 mGy) FINDINGS: 12 spot intraoperative fluoroscopic images of the right upper abdominal quadrant during ERCP provided for review. Initial image demonstrates an ERCP probe overlying the right upper abdominal quadrant. There has been removal of the pre-existing internal biliary stent. Subsequent images demonstrate selective cannulation and opacification of the common bile duct which appears markedly dilated. There is opacification of the intrahepatic biliary tree which appears at least moderately dilated. Additional radiopaque wire overlies the right upper abdominal  quadrant, potentially external to the patient. There is abrupt narrowing/occlusion involving the distal aspect of the CBD. There is no definitive opacification of either the cystic or pancreatic ducts. Completion images demonstrate placement of an internal biliary stent overlying expected location of the CBD. IMPRESSION: ERCP with biliary stent exchange. These images were submitted for radiologic interpretation only. Please see the procedural report for the amount of contrast and the fluoroscopy time utilized. Electronically Signed   By: 06/30/2021 M.D.   On: 04/29/2021 11:36  Scheduled Meds:  indomethacin  100 mg Rectal Once   pantoprazole  40 mg Oral BID   Continuous Infusions:  sodium chloride 20 mL/hr at 04/29/21 1600   piperacillin-tazobactam (ZOSYN)  IV 3.375 g (04/30/21 1032)     LOS: 2 days    Time spent: 32 minutes    Dorcas Carrow, MD [facility] Pager [phone]

## 2021-05-01 ENCOUNTER — Other Ambulatory Visit: Payer: Self-pay

## 2021-05-01 ENCOUNTER — Telehealth: Payer: Self-pay | Admitting: Internal Medicine

## 2021-05-01 DIAGNOSIS — K831 Obstruction of bile duct: Secondary | ICD-10-CM

## 2021-05-01 LAB — CULTURE, BLOOD (ROUTINE X 2): Special Requests: ADEQUATE

## 2021-05-01 NOTE — Telephone Encounter (Signed)
Inbound call from patient's daughter requesting a call from a nurse please.  Patient was recently discharged from the hospital and states per Dr. Marina Goodell needs to have blood work done but is unsure for what and where they need to go.  Please advise.

## 2021-05-01 NOTE — Telephone Encounter (Signed)
Spoke with pts daughter and she is aware Dr. Marina Goodell wanted her mother to repeat liver labs on Wednesday. Orders in epic and she is aware.

## 2021-05-02 ENCOUNTER — Other Ambulatory Visit: Payer: Self-pay

## 2021-05-02 ENCOUNTER — Encounter (HOSPITAL_COMMUNITY): Payer: Self-pay | Admitting: Gastroenterology

## 2021-05-03 ENCOUNTER — Other Ambulatory Visit (INDEPENDENT_AMBULATORY_CARE_PROVIDER_SITE_OTHER): Payer: Medicare Other

## 2021-05-03 DIAGNOSIS — K831 Obstruction of bile duct: Secondary | ICD-10-CM

## 2021-05-03 LAB — CULTURE, BLOOD (ROUTINE X 2)
Culture: NO GROWTH
Special Requests: ADEQUATE

## 2021-05-03 LAB — HEPATIC FUNCTION PANEL
ALT: 29 U/L (ref 0–35)
AST: 17 U/L (ref 0–37)
Albumin: 3.4 g/dL — ABNORMAL LOW (ref 3.5–5.2)
Alkaline Phosphatase: 352 U/L — ABNORMAL HIGH (ref 39–117)
Bilirubin, Direct: 1.1 mg/dL — ABNORMAL HIGH (ref 0.0–0.3)
Total Bilirubin: 2.4 mg/dL — ABNORMAL HIGH (ref 0.2–1.2)
Total Protein: 6.4 g/dL (ref 6.0–8.3)

## 2021-05-03 LAB — CYTOLOGY - NON PAP

## 2021-05-06 NOTE — Anesthesia Preprocedure Evaluation (Addendum)
Anesthesia Evaluation  Patient identified by MRN, date of birth, ID band Patient awake    Reviewed: Allergy & Precautions, NPO status , Patient's Chart, lab work & pertinent test results  Airway Mallampati: II  TM Distance: >3 FB Neck ROM: Full    Dental  (+) Teeth Intact, Dental Advisory Given, Caps   Pulmonary Current Smoker and Patient abstained from smoking.,    Pulmonary exam normal breath sounds clear to auscultation       Cardiovascular negative cardio ROS Normal cardiovascular exam Rhythm:Regular Rate:Normal     Neuro/Psych negative neurological ROS     GI/Hepatic PUD, GERD  Medicated,Bile duct stricture    Endo/Other  negative endocrine ROS  Renal/GU negative Renal ROS     Musculoskeletal negative musculoskeletal ROS (+)   Abdominal   Peds  Hematology  (+) Blood dyscrasia, anemia ,   Anesthesia Other Findings   Reproductive/Obstetrics                            Anesthesia Physical Anesthesia Plan  ASA: 3  Anesthesia Plan: General   Post-op Pain Management:    Induction: Intravenous  PONV Risk Score and Plan: 2 and Dexamethasone and Ondansetron  Airway Management Planned: Oral ETT  Additional Equipment:   Intra-op Plan:   Post-operative Plan: Extubation in OR  Informed Consent: I have reviewed the patients History and Physical, chart, labs and discussed the procedure including the risks, benefits and alternatives for the proposed anesthesia with the patient or authorized representative who has indicated his/her understanding and acceptance.     Dental advisory given  Plan Discussed with: CRNA  Anesthesia Plan Comments:        Anesthesia Quick Evaluation

## 2021-05-08 ENCOUNTER — Encounter (HOSPITAL_COMMUNITY)
Admission: RE | Disposition: A | Discharge status: Home / Self Care | Payer: Self-pay | Source: Home / Self Care | Attending: Gastroenterology

## 2021-05-08 ENCOUNTER — Ambulatory Visit (HOSPITAL_COMMUNITY)
Admission: RE | Admit: 2021-05-08 | Discharge: 2021-05-08 | Disposition: A | Discharge status: Home / Self Care | Payer: Medicare Other | Attending: Gastroenterology | Admitting: Gastroenterology

## 2021-05-08 ENCOUNTER — Ambulatory Visit (HOSPITAL_COMMUNITY): Payer: Medicare Other

## 2021-05-08 ENCOUNTER — Ambulatory Visit (HOSPITAL_COMMUNITY): Payer: Medicare Other | Admitting: Anesthesiology

## 2021-05-08 ENCOUNTER — Other Ambulatory Visit: Payer: Self-pay

## 2021-05-08 ENCOUNTER — Encounter (HOSPITAL_COMMUNITY): Payer: Self-pay | Admitting: Gastroenterology

## 2021-05-08 DIAGNOSIS — Y738 Miscellaneous gastroenterology and urology devices associated with adverse incidents, not elsewhere classified: Secondary | ICD-10-CM | POA: Diagnosis not present

## 2021-05-08 DIAGNOSIS — Z4659 Encounter for fitting and adjustment of other gastrointestinal appliance and device: Secondary | ICD-10-CM | POA: Diagnosis not present

## 2021-05-08 DIAGNOSIS — K838 Other specified diseases of biliary tract: Secondary | ICD-10-CM | POA: Diagnosis not present

## 2021-05-08 DIAGNOSIS — R932 Abnormal findings on diagnostic imaging of liver and biliary tract: Secondary | ICD-10-CM | POA: Insufficient documentation

## 2021-05-08 DIAGNOSIS — F17210 Nicotine dependence, cigarettes, uncomplicated: Secondary | ICD-10-CM | POA: Insufficient documentation

## 2021-05-08 DIAGNOSIS — Z980 Intestinal bypass and anastomosis status: Secondary | ICD-10-CM | POA: Insufficient documentation

## 2021-05-08 DIAGNOSIS — Z888 Allergy status to other drugs, medicaments and biological substances status: Secondary | ICD-10-CM | POA: Diagnosis not present

## 2021-05-08 DIAGNOSIS — R7989 Other specified abnormal findings of blood chemistry: Secondary | ICD-10-CM | POA: Diagnosis not present

## 2021-05-08 DIAGNOSIS — K269 Duodenal ulcer, unspecified as acute or chronic, without hemorrhage or perforation: Secondary | ICD-10-CM

## 2021-05-08 DIAGNOSIS — R945 Abnormal results of liver function studies: Secondary | ICD-10-CM | POA: Diagnosis present

## 2021-05-08 DIAGNOSIS — R933 Abnormal findings on diagnostic imaging of other parts of digestive tract: Secondary | ICD-10-CM | POA: Diagnosis present

## 2021-05-08 DIAGNOSIS — K831 Obstruction of bile duct: Secondary | ICD-10-CM | POA: Diagnosis not present

## 2021-05-08 DIAGNOSIS — T85590A Other mechanical complication of bile duct prosthesis, initial encounter: Secondary | ICD-10-CM | POA: Diagnosis not present

## 2021-05-08 DIAGNOSIS — R52 Pain, unspecified: Secondary | ICD-10-CM

## 2021-05-08 DIAGNOSIS — R17 Unspecified jaundice: Secondary | ICD-10-CM | POA: Insufficient documentation

## 2021-05-08 DIAGNOSIS — K319 Disease of stomach and duodenum, unspecified: Secondary | ICD-10-CM | POA: Diagnosis not present

## 2021-05-08 DIAGNOSIS — K2970 Gastritis, unspecified, without bleeding: Secondary | ICD-10-CM | POA: Diagnosis not present

## 2021-05-08 HISTORY — PX: STENT REMOVAL: SHX6421

## 2021-05-08 HISTORY — PX: ENDOSCOPIC RETROGRADE CHOLANGIOPANCREATOGRAPHY (ERCP) WITH PROPOFOL: SHX5810

## 2021-05-08 HISTORY — PX: EUS: SHX5427

## 2021-05-08 HISTORY — PX: BIOPSY: SHX5522

## 2021-05-08 HISTORY — PX: BILIARY DILATION: SHX6850

## 2021-05-08 HISTORY — PX: BILIARY STENT PLACEMENT: SHX5538

## 2021-05-08 HISTORY — PX: REMOVAL OF STONES: SHX5545

## 2021-05-08 HISTORY — PX: BILIARY BRUSHING: SHX6843

## 2021-05-08 HISTORY — PX: FINE NEEDLE ASPIRATION: SHX5430

## 2021-05-08 HISTORY — PX: SPYGLASS CHOLANGIOSCOPY: SHX5441

## 2021-05-08 SURGERY — UPPER ENDOSCOPIC ULTRASOUND (EUS) RADIAL
Anesthesia: General

## 2021-05-08 MED ORDER — FENTANYL CITRATE (PF) 100 MCG/2ML IJ SOLN
INTRAMUSCULAR | Status: AC
  Filled 2021-05-08: qty 2

## 2021-05-08 MED ORDER — PHENYLEPHRINE HCL (PRESSORS) 10 MG/ML IV SOLN
INTRAVENOUS | Status: DC | PRN
  Administered 2021-05-08: 40 ug via INTRAVENOUS

## 2021-05-08 MED ORDER — LIDOCAINE HCL 1 % IJ SOLN
INTRAMUSCULAR | Status: DC | PRN
  Administered 2021-05-08 (×2): 50 mg via INTRADERMAL

## 2021-05-08 MED ORDER — GLUCAGON HCL RDNA (DIAGNOSTIC) 1 MG IJ SOLR
INTRAMUSCULAR | Status: AC
  Filled 2021-05-08: qty 1

## 2021-05-08 MED ORDER — GLUCAGON HCL RDNA (DIAGNOSTIC) 1 MG IJ SOLR
INTRAMUSCULAR | Status: DC | PRN
  Administered 2021-05-08 (×4): .25 mg via INTRAVENOUS

## 2021-05-08 MED ORDER — EPHEDRINE SULFATE 50 MG/ML IJ SOLN
INTRAMUSCULAR | Status: DC | PRN
  Administered 2021-05-08: 5 mg via INTRAVENOUS
  Administered 2021-05-08: 15 mg via INTRAVENOUS

## 2021-05-08 MED ORDER — LACTATED RINGERS IV SOLN
INTRAVENOUS | Status: AC | PRN
  Administered 2021-05-08: 1000 mL via INTRAVENOUS

## 2021-05-08 MED ORDER — SUGAMMADEX SODIUM 200 MG/2ML IV SOLN
INTRAVENOUS | Status: DC | PRN
  Administered 2021-05-08: 150 mg via INTRAVENOUS

## 2021-05-08 MED ORDER — PROPOFOL 1000 MG/100ML IV EMUL
INTRAVENOUS | Status: AC
  Filled 2021-05-08: qty 100

## 2021-05-08 MED ORDER — SUCRALFATE 1 GM/10ML PO SUSP: 1.0000 g | Freq: Two times a day (BID) | ORAL | 1 refills | Status: DC

## 2021-05-08 MED ORDER — SODIUM CHLORIDE 0.9 % IV SOLN: INTRAVENOUS | Status: DC

## 2021-05-08 MED ORDER — FENTANYL CITRATE (PF) 100 MCG/2ML IJ SOLN: 25.0000 ug | INTRAMUSCULAR | Status: DC | PRN

## 2021-05-08 MED ORDER — ONDANSETRON HCL 4 MG/2ML IJ SOLN: 4.0000 mg | Freq: Once | INTRAMUSCULAR | Status: DC | PRN

## 2021-05-08 MED ORDER — CIPROFLOXACIN IN D5W 400 MG/200ML IV SOLN
INTRAVENOUS | Status: DC | PRN
  Administered 2021-05-08: 400 mg via INTRAVENOUS

## 2021-05-08 MED ORDER — FENTANYL CITRATE (PF) 100 MCG/2ML IJ SOLN
INTRAMUSCULAR | Status: DC | PRN
  Administered 2021-05-08 (×3): 25 ug via INTRAVENOUS
  Administered 2021-05-08: 50 ug via INTRAVENOUS
  Administered 2021-05-08: 25 ug via INTRAVENOUS

## 2021-05-08 MED ORDER — ROCURONIUM BROMIDE 100 MG/10ML IV SOLN
INTRAVENOUS | Status: DC | PRN
  Administered 2021-05-08 (×3): 10 mg via INTRAVENOUS
  Administered 2021-05-08: 60 mg via INTRAVENOUS

## 2021-05-08 MED ORDER — CIPROFLOXACIN IN D5W 400 MG/200ML IV SOLN
INTRAVENOUS | Status: AC
  Filled 2021-05-08: qty 200

## 2021-05-08 MED ORDER — PROPOFOL 10 MG/ML IV BOLUS
INTRAVENOUS | Status: DC | PRN
  Administered 2021-05-08: 120 mg via INTRAVENOUS

## 2021-05-08 MED ORDER — INDOMETHACIN 50 MG RE SUPP
RECTAL | Status: AC
  Filled 2021-05-08: qty 2

## 2021-05-08 MED ORDER — PROPOFOL 500 MG/50ML IV EMUL
INTRAVENOUS | Status: AC
  Filled 2021-05-08: qty 50

## 2021-05-08 NOTE — Op Note (Addendum)
[facility] Patient Name: Alicia Spencer Procedure Date: 05/08/2021 MRN: [id number] Attending MD: Alicia Spencer , MD Date of Birth: 02-05-1941 CSN: [id number] Age: 80 Admit Type: Inpatient Procedure:                Upper EUS Indications:              Common bile duct dilation (etiology unknown) seen                            on CT scan, Common bile duct dilation (acquired)                            seen on MRCP, Abnormal liver function test Providers:                Alicia ParishGabriel Mansouraty, MD, Alicia Sorrelarrie Baker, RN, Brion AlimentShayla                            Spencer, Technician, Alicia ClipperPeggy Flynn-Cook, CRNA Referring MD:             Alicia BonitoJohn N. Marina GoodellPerry, MD, Alicia Spencer Medicines:                General Anesthesia Complications:            No immediate complications. Estimated Blood Loss:     Estimated blood loss was minimal. Procedure:                Pre-Anesthesia Assessment:                           - Prior to the procedure, a History and Physical                            was performed, and patient medications and                            allergies were reviewed. The patient's tolerance of                            previous anesthesia was also reviewed. The risks                            and benefits of the procedure and the sedation                            options and risks were discussed with the patient.                            All questions were answered, and informed consent                            was obtained. Prior Anticoagulants: The patient has                            taken no previous anticoagulant or antiplatelet  agents. ASA Grade Assessment: III - A patient with                            severe systemic disease. After reviewing the risks                            and benefits, the patient was deemed in                            satisfactory condition to undergo the procedure.                           After obtaining informed  consent, the endoscope was                            passed under direct vision. Throughout the                            procedure, the patient's blood pressure, pulse, and                            oxygen saturations were monitored continuously. The                            ERCP [id number] was introduced through the mouth, and                            used to inject contrast into second part of                            duodenum. The ([id number]) [id number] Linear EUS was                            introduced through the mouth, and used to inject                            contrast into duodenum for ultrasound examination                            from the stomach and duodenum. After obtaining                            informed consent, the endoscope was passed under                            direct vision. Throughout the procedure, the                            patient's blood pressure, pulse, and oxygen                            saturations were monitored continuously.The upper  EUS was accomplished without difficulty. The                            patient tolerated the procedure. Scope In: Scope Out: Findings:      ENDOSCOPIC FINDING: :      Patchy moderate inflammation characterized by erosions, erythema and       granularity was found in the entire examined stomach. Biopsies were       taken with a cold forceps for histology and Helicobacter pylori testing.      One non-bleeding cratered duodenal ulcer with a clean ulcer base       (Forrest Class III) was found in the first portion of the duodenum and       in the second portion of the duodenum. The lesion was 10 mm in largest       dimension.      There was evidence of a patent previous sphinctertomy at the major       papilla.      A previously placed plastic biliary stent was seen at the major papilla.       Removal of a stent was accomplished with a snare and this was sent for        cytology.      No other gross lesions were noted in the duodenal bulb, in the first       portion of the duodenum and in the second portion of the duodenum.      ENDOSONOGRAPHIC FINDING: :      Endosonographic imaging of the ampulla showed no extrinsic compression,       intramural (subepithelial) lesion or mass.      There was dilation in the common bile duct (2.2 mm -> 8.4 mm -> 10 mm).       There was evidence of sludge vs pneumobilia within the duct.      There was evidence of very distal bile duct thickening (up to 2.8 mm).       Fine needle biopsy was performed of this region. Color Doppler imaging       was utilized prior to needle puncture to confirm a lack of significant       vascular structures within the needle path. Six passes were made with       the Acquire 22 gauge ultrasound core biopsy needle using a transduodenal       approach. Visible cores of tissue were obtained. Preliminary cytologic       examination and touch preps were performed. Final cytology results are       pending.      Endosonographic imaging in the pancreatic head (PD - 1.6 mm), genu of       the pancreas (PD - 2.0 mm), pancreatic body (PD - 0.9 mm) and pancreatic       tail (PD - 1.5 mm) showed no chronic pancreatitis, mass or pancreatic       duct changes.      Endosonographic imaging in the visualized portion of the liver showed no       mass.      No malignant-appearing lymph nodes were visualized in the celiac region       (level 20), peripancreatic region and porta hepatis region.      The celiac region was visualized. Impression:               EGD Impression:                           -  Gastritis. Biopsied.                           - Non-bleeding duodenal ulcer with a clean ulcer                            base (Forrest Class III) at D1/D2 sweep.                           - Patent sphincterotomy.                           - Plastic biliary stent at ampulla was removed and                             sent for cytology.                           - No other gross lesions in the duodenal bulb, in                            the first portion of the duodenum and in the second                            portion of the duodenum.                           EUS Impression:                           - There was dilation in the common bile duct.                           - Distal CBD thickening noted. This was sampled via                            EUS Fine Needle Biopsy.                           - No evidence of a pancreatic abnormality noted                            today.                           - No evidence of an ampullary mass/abnormality                            other than the thickening of the CBD noted above.                           - No malignant-appearing lymph nodes were                            visualized in the celiac region (level 20),  peripancreatic region and porta hepatis region. Moderate Sedation:      Not Applicable - Patient had care per Anesthesia. Recommendation:           - Proceed to scheduled ERCP.                           - Await cytology results and await path results.                           - Continue PPI BID.                           - Add Carafate BID.                           - The findings and recommendations were discussed                            with the patient.                           - The findings and recommendations were discussed                            with the patient's family. Procedure Code(s):        --- Professional ---                           [phone], Esophagogastroduodenoscopy, flexible,                            transoral; with transendoscopic ultrasound-guided                            intramural or transmural fine needle                            aspiration/biopsy(s), (includes endoscopic                            ultrasound examination limited to the esophagus,                             stomach or duodenum, and adjacent structures)                           43247, Esophagogastroduodenoscopy, flexible,                            transoral; with removal of foreign body(s) Diagnosis Code(s):        --- Professional ---                           K29.70, Gastritis, unspecified, without bleeding                           K26.9, Duodenal ulcer, unspecified as acute or  chronic, without hemorrhage or perforation                           Z98.0, Intestinal bypass and anastomosis status                           Z46.59, Encounter for fitting and adjustment of                            other gastrointestinal appliance and device                           K83.8, Other specified diseases of biliary tract                           I89.9, Noninfective disorder of lymphatic vessels                            and lymph nodes, unspecified                           R94.5, Abnormal results of liver function studies                           R93.2, Abnormal findings on diagnostic imaging of                            liver and biliary tract CPT copyright 2019 [employer]. All rights reserved. The codes documented in this report are preliminary and upon coder review may  be revised to meet current compliance requirements. Alicia Parish, MD 05/08/2021 10:30:48 AM Number of Addenda: 0

## 2021-05-08 NOTE — Op Note (Signed)
[facility] Patient Name: Alicia Spencer Procedure Date: 05/08/2021 MRN: [id number] Attending MD: Justice Britain , MD Date of Birth: Dec 10, 1940 CSN: [id number] Age: 80 Admit Type: Outpatient Procedure:                ERCP Indications:              Common bile duct stricture, Biliary dilation on                            Computed Tomogram Scan, Abnormal MRCP, Jaundice,                            Abnormal liver function test Providers:                Justice Britain, MD Referring MD:             Docia Chuck. Henrene Pastor, MD, Jama Flavors. Wilson Medicines:                Monitored Anesthesia Care, General Anesthesia,                            Cipro 400 mg IV, Indomethacin 100 mg PR, Glucagon 1                            mg IV Complications:            No immediate complications. Estimated Blood Loss:     Estimated blood loss was minimal. Procedure:                Pre-Anesthesia Assessment:                           - Prior to the procedure, a History and Physical                            was performed, and patient medications and                            allergies were reviewed. The patient's tolerance of                            previous anesthesia was also reviewed. The risks                            and benefits of the procedure and the sedation                            options and risks were discussed with the patient.                            All questions were answered, and informed consent                            was obtained. Prior Anticoagulants: The patient has  taken no previous anticoagulant or antiplatelet                            agents. ASA Grade Assessment: III - A patient with                            severe systemic disease. After reviewing the risks                            and benefits, the patient was deemed in                            satisfactory condition to undergo the procedure.                            After obtaining informed consent, the scope was                            passed under direct vision. Throughout the                            procedure, the patient's blood pressure, pulse, and                            oxygen saturations were monitored continuously. The                            ERCP was technically difficult and complex due to                            inadequate patient positioning. Successful                            completion of the procedure was aided by performing                            the maneuvers documented (below) in this report.                            The patient tolerated the procedure. The ERCP                            [id number] was introduced through the mouth, and used                            to inject contrast into and used for direct                            visualization of the bile duct. Findings:      A biliary stent was visible on the scout film initially.      The esophagus was successfully intubated under direct vision without       detailed examination of the pharynx, larynx, and associated structures,       and  upper GI tract. A biliary sphincterotomy had been performed. The       sphincterotomy appeared open. One stent which had been placed through       the major papilla into the biliary tree was no longer visible as we had       removed it during the EUS.      A short 0.035 inch Soft Jagwire was passed into the biliary tree. The       Autotome sphincterotome was passed over the guidewire and the bile duct       was then deeply cannulated. Contrast was injected. I personally       interpreted the bile duct images. There was appropriate flow of contrast       through the ducts. Image quality was adequate. Contrast extended to the       hepatic ducts. Opacification of the entire biliary tree except for the       cystic duct and gallbladder was successful. The very distal portion of       the main bile duct contained a single  severe stenosis 15 mm in length.       The lower third of the main bile duct, middle third of the main bile       duct and upper third of the main bile duct were moderately dilated due       to stricture. The largest diameter was 15 mm. To discover objects, the       biliary tree was swept with a retrieval balloon starting at the       bifurcation. Sludge was swept from the duct. Dilation of the common bile       duct with a 6 mm Hurricane balloon dilator was successful as a       sphincteroplasty for a total of 4 minutes. Cells for cytology were       obtained by brushing the strictured area.      Due to previous negative cytology, decision made to pursue Spyglass       attempt at cannulation/cholangioscopy. The bile duct was explored       endoscopically using the SpyGlass direct visualization system. The       SpyScope had difficulty remaining in position (lost position over 10       times) due to the stricture being so distal and the angulation of the       biliary orifice. I was able to advance it to the stricture, but I could       not maneuver it beyond and more proximally. Visibility with the scope       was fair. Abnormal mucosa characterized by granularity and increased       vascular pattern was found in the lower third of the main bile duct. The       lower third of the main bile duct was biopsied with a SpyBite miniature       biopsy forceps for histology.      Then a A short 0.035 inch Soft Jagwire was passed into the biliary tree.       The Autotome sphincterotome was passed over the guidewire and the bile       duct was then deeply cannulated. One 10 Fr by 5 cm plastic biliary stent       with a single external flap and a single internal flap was placed into       the common bile duct. Bile flowed  through the stent. The stent was in       good position.      A pancreatogram was not performed.      The duodenoscope was withdrawn from the patient. Impression:               -  Prior biliary sphincterotomy appeared open.                            Previous stent had been removed during EUS.                           - A single severe biliary stricture was found in                            the lower third of the main bile duct. The                            stricture was indeterminate though concerning for                            possible malignancy.                           - The upper third of the main bile duct, middle                            third of the main bile duct and lower third of the                            main bile duct were moderately dilated due to the                            stricture.                           - The biliary tree was swept and sludge was found.                           - Common bile duct was successfully dilated as                            sphincteroplasty.                           - Cells for cytology obtained from the stricture.                           - Abnormal biliary tract mucosa was found found on                            cholangioscopy but stricture so distal and tight,                            unable to traverse with the Spyglass scope. Spybite  biopsies obtained from this region.                           - One plastic biliary stent was placed into the                            common bile duct. Moderate Sedation:      Not Applicable - Patient had care per Anesthesia. Recommendation:           - The patient will be observed post-procedure,                            until all discharge criteria are met.                           - Discharge patient to home.                           - Patient has a contact number available for                            emergencies. The signs and symptoms of potential                            delayed complications were discussed with the                            patient. Return to normal activities tomorrow.                             Written discharge instructions were provided to the                            patient.                           - Low fat diet.                           - Observe patient's clinical course.                           - Await cytology results and await path results.                           - Repeat ERCP in 4 months to exchange stent.                           - Watch for pancreatitis, bleeding, perforation,                            and cholangitis.                           - The findings and recommendations were discussed  with the patient.                           - The findings and recommendations were discussed                            with the patient's family. Procedure Code(s):        --- Professional ---                           [phone], Endoscopic retrograde                            cholangiopancreatography (ERCP); with removal and                            exchange of stent(s), biliary or pancreatic duct,                            including pre- and post-dilation and guide wire                            passage, when performed, including sphincterotomy,                            when performed, each stent exchanged                           43264, Endoscopic retrograde                            cholangiopancreatography (ERCP); with removal of                            calculi/debris from biliary/pancreatic duct(s)                           43273, Endoscopic cannulation of papilla with                            direct visualization of pancreatic/common bile                            duct(s) (List separately in addition to code(s) for                            primary procedure) Diagnosis Code(s):        --- Professional ---                           Y09.983J, Displacement of bile duct prosthesis,                            initial encounter                           K83.9, Disease of biliary tract, unspecified  K83.1, Obstruction of bile duct                           K83.8, Other specified diseases of biliary tract                           R17, Unspecified jaundice                           R94.5, Abnormal results of liver function studies                           R93.2, Abnormal findings on diagnostic imaging of                            liver and biliary tract CPT copyright 2019 American [employer] Association. All rights reserved. The codes documented in this report are preliminary and upon coder review may  be revised to meet current compliance requirements. Justice Britain, MD 05/08/2021 10:42:59 AM Number of Addenda: 0

## 2021-05-08 NOTE — Interval H&P Note (Signed)
History and Physical Interval Note:  05/08/2021 7:22 AM  Alicia Spencer  has presented today for surgery, with the diagnosis of bile duct stricture, duodenal ulcer.  The various methods of treatment have been discussed with the patient and family. After consideration of risks, benefits and other options for treatment, the patient has consented to  Procedure(s): UPPER ENDOSCOPIC ULTRASOUND (EUS) RADIAL (N/A) ENDOSCOPIC RETROGRADE CHOLANGIOPANCREATOGRAPHY (ERCP) WITH PROPOFOL (N/A) SPYGLASS CHOLANGIOSCOPY (N/A) as a surgical intervention.  The patient's history has been reviewed, patient examined, no change in status, stable for surgery.  I have reviewed the patient's chart and labs.  Questions were answered to the patient's satisfaction.    The risks of an ERCP were discussed at length, including but not limited to the risk of perforation, bleeding, abdominal pain, post-ERCP pancreatitis (while usually mild can be severe and even life threatening).  The risks of an EUS including intestinal perforation, bleeding, infection, aspiration, and medication effects were discussed as was the possibility it may not give a definitive diagnosis if a biopsy is performed.  When a biopsy of the pancreas is done as part of the EUS, there is an additional risk of pancreatitis at the rate of about 1-2%.  It was explained that procedure related pancreatitis is typically mild, although it can be severe and even life threatening, which is why we do not perform random pancreatic biopsies and only biopsy a lesion/area we feel is concerning enough to warrant the risk.    [employer]

## 2021-05-08 NOTE — Anesthesia Procedure Notes (Signed)
Procedure Name: Intubation Date/Time: 05/08/2021 7:47 AM Performed by: Garrel Ridgel, CRNA Pre-anesthesia Checklist: Patient identified, Emergency Drugs available, Suction available and Patient being monitored Patient Re-evaluated:Patient Re-evaluated prior to induction Oxygen Delivery Method: Circle system utilized Preoxygenation: Pre-oxygenation with 100% oxygen Induction Type: IV induction Ventilation: Mask ventilation without difficulty Laryngoscope Size: Mac and 3 Grade View: Grade I Tube type: Oral Tube size: 7.0 mm Number of attempts: 1 Airway Equipment and Method: Stylet and Oral airway Placement Confirmation: ETT inserted through vocal cords under direct vision, positive ETCO2 and breath sounds checked- equal and bilateral Secured at: 22 cm Tube secured with: Tape Dental Injury: Teeth and Oropharynx as per pre-operative assessment

## 2021-05-08 NOTE — Discharge Instructions (Signed)
YOU HAD AN ENDOSCOPIC PROCEDURE TODAY: Refer to the procedure report and other information in the discharge instructions given to you for any specific questions about what was found during the examination. If this information does not answer your questions, please call [place] office at [phone] to clarify.   YOU SHOULD EXPECT: Some feelings of bloating in the abdomen. Passage of more gas than usual. Walking can help get rid of the air that was put into your GI tract during the procedure and reduce the bloating. If you had a lower endoscopy (such as a colonoscopy or flexible sigmoidoscopy) you may notice spotting of blood in your stool or on the toilet paper. Some abdominal soreness may be present for a day or two, also.  DIET: Your first meal following the procedure should be a light meal and then it is ok to progress to your normal diet. A half-sandwich or bowl of soup is an example of a good first meal. Heavy or fried foods are harder to digest and may make you feel nauseous or bloated. Drink plenty of fluids but you should avoid alcoholic beverages for 24 hours. If you had a esophageal dilation, please see attached instructions for diet.    ACTIVITY: Your care partner should take you home directly after the procedure. You should plan to take it easy, moving slowly for the rest of the day. You can resume normal activity the day after the procedure however YOU SHOULD NOT DRIVE, use power tools, machinery or perform tasks that involve climbing or major physical exertion for 24 hours (because of the sedation medicines used during the test).   SYMPTOMS TO REPORT IMMEDIATELY: A gastroenterologist can be reached at any hour. Please call [phone]  for any of the following symptoms:   Following upper endoscopy (EGD, EUS, ERCP, esophageal dilation) Vomiting of blood or coffee ground material  New, significant abdominal pain  New, significant chest pain or pain under the shoulder blades  Painful or  persistently difficult swallowing  New shortness of breath  Black, tarry-looking or red, bloody stools  FOLLOW UP:  If any biopsies were taken you will be contacted by phone or by letter within the next 1-3 weeks. Call [phone]  if you have not heard about the biopsies in 3 weeks.  Please also call with any specific questions about appointments or follow up tests.  

## 2021-05-08 NOTE — Anesthesia Postprocedure Evaluation (Signed)
Anesthesia Post Note  Patient: Alicia Spencer  Procedure(s) Performed: UPPER ENDOSCOPIC ULTRASOUND (EUS) RADIAL ENDOSCOPIC RETROGRADE CHOLANGIOPANCREATOGRAPHY (ERCP) WITH PROPOFOL SPYGLASS CHOLANGIOSCOPY BIOPSY FINE NEEDLE ASPIRATION (FNA) RADIAL STENT REMOVAL BILIARY BRUSHING BILIARY DILATION BILIARY STENT PLACEMENT     Patient location during evaluation: PACU Anesthesia Type: General Level of consciousness: awake and alert Pain management: pain level controlled Vital Signs Assessment: post-procedure vital signs reviewed and stable Respiratory status: spontaneous breathing, nonlabored ventilation, respiratory function stable and patient connected to nasal cannula oxygen Cardiovascular status: blood pressure returned to baseline and stable Postop Assessment: no apparent nausea or vomiting Anesthetic complications: no   No notable events documented.  Last Vitals:  Vitals:   05/08/21 1030 05/08/21 1040  BP: (!) 145/78 (!) 150/83  Pulse: 82 79  Resp: 16 16  Temp:    SpO2: 98% 94%    Last Pain:  Vitals:   05/08/21 1040  TempSrc:   PainSc: 0-No pain                 Cecile Hearing

## 2021-05-08 NOTE — Transfer of Care (Signed)
Immediate Anesthesia Transfer of Care Note  Patient: Alicia Spencer  Procedure(s) Performed: UPPER ENDOSCOPIC ULTRASOUND (EUS) RADIAL ENDOSCOPIC RETROGRADE CHOLANGIOPANCREATOGRAPHY (ERCP) WITH PROPOFOL SPYGLASS CHOLANGIOSCOPY BIOPSY FINE NEEDLE ASPIRATION (FNA) RADIAL STENT REMOVAL  Patient Location: PACU and Endoscopy Unit  Anesthesia Type:General  Level of Consciousness: oriented, drowsy and patient cooperative  Airway & Oxygen Therapy: Patient Spontanous Breathing and Patient connected to face mask oxygen  Post-op Assessment: Report given to RN and Post -op Vital signs reviewed and stable  Post vital signs: Reviewed and stable  Last Vitals:  Vitals Value Taken Time  BP    Temp    Pulse    Resp 21 05/08/21 1023  SpO2    Vitals shown include unvalidated device data.  Last Pain:  Vitals:   05/08/21 0658  TempSrc: Temporal  PainSc: 2          Complications: No notable events documented.

## 2021-05-09 ENCOUNTER — Encounter (HOSPITAL_COMMUNITY): Payer: Self-pay | Admitting: Gastroenterology

## 2021-05-09 ENCOUNTER — Encounter: Payer: Self-pay | Admitting: Gastroenterology

## 2021-05-09 LAB — CYTOLOGY - NON PAP

## 2021-05-09 LAB — SURGICAL PATHOLOGY

## 2021-05-10 ENCOUNTER — Other Ambulatory Visit: Payer: Self-pay

## 2021-05-10 DIAGNOSIS — R935 Abnormal findings on diagnostic imaging of other abdominal regions, including retroperitoneum: Secondary | ICD-10-CM

## 2021-05-10 DIAGNOSIS — K831 Obstruction of bile duct: Secondary | ICD-10-CM

## 2021-05-10 DIAGNOSIS — R7989 Other specified abnormal findings of blood chemistry: Secondary | ICD-10-CM

## 2021-05-24 ENCOUNTER — Other Ambulatory Visit: Payer: Self-pay | Admitting: *Deleted

## 2021-05-24 NOTE — Progress Notes (Signed)
The proposed treatment discussed in conference is for discussion purpose only and is not a binding recommendation.  The patients have not been physically examined, or presented with their treatment options.  Therefore, final treatment plans cannot be decided.  

## 2021-05-25 ENCOUNTER — Telehealth: Payer: Self-pay | Admitting: Gastroenterology

## 2021-05-25 NOTE — Telephone Encounter (Signed)
I was able to speak with the patient this afternoon as well as her husband. Discussed the findings from [facility].  Next steps are as follows: 1) surgical referral to pancreaticobiliary surgery [place] surgery (Dr. Allen/Dr. 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 Marina Goodell name or myself) 3) ERCP recall with stent exchange in 4 months (this is if the patient does not undergo surgery)  Please forward this information to the patient's PCP as well. Thanks. GM   FYI FB and SLA this is a patient we discussed at [facility] this week

## 2021-05-25 NOTE — Telephone Encounter (Signed)
Case discussed at [facility] this week. Reviewed pathology and imaging studies. Pathology showed on her biliary brushings and are fine-needle biopsy of the thickened bile duct, evidence suspicious for malignancy. 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 We discussed whether there would be a role to repeat EUS and brushings versus going directly to surgical resection. Although we could repeat procedures if the patient/family feel they want more evidence, it is felt that based on the clinical history it would be recommended to go ahead and go forward with surgical evaluation. I called patient but unavailable this AM. Will try to call back this PM after procedures.  GM

## 2021-05-26 ENCOUNTER — Other Ambulatory Visit: Payer: Self-pay

## 2021-05-26 DIAGNOSIS — K831 Obstruction of bile duct: Secondary | ICD-10-CM

## 2021-05-26 DIAGNOSIS — R935 Abnormal findings on diagnostic imaging of other abdominal regions, including retroperitoneum: Secondary | ICD-10-CM

## 2021-05-26 DIAGNOSIS — R7989 Other specified abnormal findings of blood chemistry: Secondary | ICD-10-CM

## 2021-05-26 NOTE — Telephone Encounter (Signed)
Lab orders have been entered  Referral to [facility] made  Recall ERCP has been entered

## 2021-05-26 NOTE — Telephone Encounter (Signed)
Sent this information to the PCP

## 2021-05-30 NOTE — Telephone Encounter (Signed)
Recall has been entered for 4 month stent replacement.

## 2021-05-30 NOTE — Telephone Encounter (Signed)
Dr Meridee Score I received a call from [facility] and they state that the pt and her husband decided that she does not want to proceed with [facility] referral at this time.

## 2021-05-30 NOTE — Telephone Encounter (Signed)
Patty, Thank you for the update.  I called the patient and spoke with her and the husband. The patient's husband has done research on his own and feels that there is absolutely no benefit for the patient to undergo surgery.  I tried to discuss with him why it would be reasonable to at least have the opportunity to talk with the pancreaticobiliary surgeons but he is adamant that they do not want to have surgery. When I speak with the patient's husband about consideration of meeting with oncology to have evaluation for whether radiation or chemoradiation could be option since they are taking surgery off the table (even though they have not ever spoken with the surgeon) he also states and is adamant that they do not want to have chemotherapy or radiation. I tried to reiterate to the patient's husband that if you do not have all the information about what the risks and benefit profiles are of potential therapeutic management options it is hard to know whether the decision to not have some sort of intervention is really in the patient's best interest; but he is adamant that he has had enough discussion about this. The patient and husband understand that if no intervention is performed other than stent exchange that with the likelihood of underlying cancer being extremely high that progression of disease and more quick loss of life is possible but they understand and accept this risk. At this point they still need to come in to at some point have repeat liver test to ensure stent function and they will do that within the next 1 to 2 weeks (orders already in place). Patient needs a stent exchange in 4 months and that can be done by Dr. Marina Goodell or myself for metal stent fully covered versus noncovered.  I am placing this back to the patient's primary gastroenterologist so that he is aware in case he wants to have or meet this total and her husband to help them ensure that they have the information to make the best  decision about her care.  GM

## 2021-06-02 ENCOUNTER — Other Ambulatory Visit (INDEPENDENT_AMBULATORY_CARE_PROVIDER_SITE_OTHER): Payer: Medicare Other

## 2021-06-02 DIAGNOSIS — R935 Abnormal findings on diagnostic imaging of other abdominal regions, including retroperitoneum: Secondary | ICD-10-CM | POA: Diagnosis not present

## 2021-06-02 DIAGNOSIS — K831 Obstruction of bile duct: Secondary | ICD-10-CM

## 2021-06-02 DIAGNOSIS — R7989 Other specified abnormal findings of blood chemistry: Secondary | ICD-10-CM | POA: Diagnosis not present

## 2021-06-02 LAB — HEPATIC FUNCTION PANEL
ALT: 9 U/L (ref 0–35)
AST: 13 U/L (ref 0–37)
Albumin: 3.9 g/dL (ref 3.5–5.2)
Alkaline Phosphatase: 126 U/L — ABNORMAL HIGH (ref 39–117)
Bilirubin, Direct: 0.2 mg/dL (ref 0.0–0.3)
Total Bilirubin: 0.8 mg/dL (ref 0.2–1.2)
Total Protein: 6.8 g/dL (ref 6.0–8.3)

## 2021-06-05 LAB — CANCER ANTIGEN 19-9: CA 19-9: 7 U/mL (ref <34)

## 2021-06-20 ENCOUNTER — Other Ambulatory Visit: Payer: Self-pay | Admitting: Gastroenterology

## 2021-06-26 ENCOUNTER — Other Ambulatory Visit: Payer: Self-pay | Admitting: Gastroenterology

## 2021-09-20 ENCOUNTER — Telehealth: Payer: Self-pay

## 2021-09-20 NOTE — Telephone Encounter (Signed)
Attempted to call pt, pts husband stated pt was not there and hung up the phone. Will try again.

## 2021-09-20 NOTE — Telephone Encounter (Signed)
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  She has elected against surgery.  Please do the following: 1.  Call her and see how she is doing 2.  Have her come in for CBC and liver tests at this time 3.  Have her schedule a routine office appointment with me sometime thereafter in the near future (nonurgent if she is feeling okay). Please convert this information into phone note for the record and future reference.  Thanks Dr. Marina Goodell

## 2021-09-21 ENCOUNTER — Telehealth: Payer: Self-pay

## 2021-09-21 NOTE — Telephone Encounter (Signed)
Attempted to call pt yesterday, husband stated she was not home and hung up. Called today and spoke with pt. She states she thinks she is doing pretty good and feeling ok. Let her know Dr. Marina Goodell wanted her to come in for some lab work and an OV that was not urgent since she felt ok. Pt spoke to her husband about if he wanted her to come for lab and he stated no. She asked him if he wanted her to see the doctor about her liver and he said no. Pt spoke back into the phone and said I am not come to see the doctor about my liver, bye and hung up. Dr. Marina Goodell notified.

## 2021-09-21 NOTE — Telephone Encounter (Signed)
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  She has elected against surgery.  Please do the following: 1.  Call her and see how she is doing 2.  Have her come in for CBC and liver tests at this time 3.  Have her schedule a routine office appointment with me sometime thereafter in the near future (nonurgent if she is feeling okay). Please convert this information into phone note for the record and future reference.  Thanks Dr. Marina Goodell

## 2021-09-22 NOTE — Telephone Encounter (Signed)
I contacted the patient myself short while ago.  She seems to have some limited understanding into her clinical issues.  She suggests that her husband would be the one to speak with.  He refuses to come to the telephone.  I did explain to her that her stent could become dislodged or clogged which may lead to life-threatening sepsis.  I urged her to come in for blood work and an office visit.  The telephone was hung up.  I will asked my nurse to reach out to the patient's PCP to see if they might have some inroads in this regard.  Bonita Quin, Please contact patient's PCP regarding the above.  See if he can convince her (and her husband) to at least come in for lab work and an office checkup.  Thanks Dr. Marina Goodell

## 2021-09-22 NOTE — Telephone Encounter (Signed)
Thanks [employer].  Keep me posted

## 2021-09-22 NOTE — Telephone Encounter (Signed)
Call placed to Dr. Tawana Scale office. He and his nurse are off, he only works on [employer], Wed, and [employer]. Will call back on Tuesday.

## 2021-09-26 ENCOUNTER — Telehealth: Payer: Self-pay | Admitting: Gastroenterology

## 2021-09-26 ENCOUNTER — Other Ambulatory Visit: Payer: Self-pay

## 2021-09-26 DIAGNOSIS — R7989 Other specified abnormal findings of blood chemistry: Secondary | ICD-10-CM

## 2021-09-26 NOTE — Telephone Encounter (Signed)
Inbound call from patients husband, wants to know if lab work needs to be done before appointment. Please advise.

## 2021-09-26 NOTE — Telephone Encounter (Signed)
Called and left message for Dr. Tawana Scale office to call me back regarding the pt.  Dr. Andrey Campanile does not see the pt she is seen by a PA at the office. Their office called and spoke with the husband and he stated he would bring her for an appt. Pt scheduled to see Dr. Marina Goodell 10/05/21 at 8:40am. Order in for LFT's and requested she have the pt go to the lab prior to her appt that morning. Dr. Marina Goodell awarel

## 2021-09-26 NOTE — Telephone Encounter (Signed)
Spoke with pts husband and let him know she can come anytime M-F between the hours of 7:30am-5pm, no appt needed for labs. He verbalized understanding.

## 2021-10-05 ENCOUNTER — Encounter: Payer: Self-pay | Admitting: Internal Medicine

## 2021-10-05 ENCOUNTER — Ambulatory Visit (INDEPENDENT_AMBULATORY_CARE_PROVIDER_SITE_OTHER): Payer: Medicare Other | Admitting: Internal Medicine

## 2021-10-05 ENCOUNTER — Other Ambulatory Visit (INDEPENDENT_AMBULATORY_CARE_PROVIDER_SITE_OTHER): Payer: Medicare Other

## 2021-10-05 ENCOUNTER — Other Ambulatory Visit: Payer: Self-pay

## 2021-10-05 VITALS — BP 134/80 | HR 84 | Ht 58.75 in | Wt 112.4 lb

## 2021-10-05 DIAGNOSIS — R935 Abnormal findings on diagnostic imaging of other abdominal regions, including retroperitoneum: Secondary | ICD-10-CM

## 2021-10-05 DIAGNOSIS — R7989 Other specified abnormal findings of blood chemistry: Secondary | ICD-10-CM | POA: Diagnosis not present

## 2021-10-05 DIAGNOSIS — K831 Obstruction of bile duct: Secondary | ICD-10-CM | POA: Diagnosis not present

## 2021-10-05 LAB — HEPATIC FUNCTION PANEL
ALT: 20 U/L (ref 0–35)
AST: 19 U/L (ref 0–37)
Albumin: 4.5 g/dL (ref 3.5–5.2)
Alkaline Phosphatase: 117 U/L (ref 39–117)
Bilirubin, Direct: 0.2 mg/dL (ref 0.0–0.3)
Total Bilirubin: 0.8 mg/dL (ref 0.2–1.2)
Total Protein: 7.6 g/dL (ref 6.0–8.3)

## 2021-10-05 NOTE — Patient Instructions (Signed)
Please come in to the lab once a month for liver tests  Please follow up in 6 months

## 2021-10-05 NOTE — Progress Notes (Signed)
HISTORY OF PRESENT ILLNESS:  Alicia Spencer is a 80 y.o. 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  She is accompanied by her husband.  In summary, she presented to the hospital with jaundice and was seen in consultation March 23, 2021.  She was found to have biliary ductal dilation and underwent ERCP March 24, 2021.  In addition to abrupt cut off of the distal bile duct, she was noted to have ulcerative changes in the duodenum including the ampulla.  Biopsies were negative for neoplasia.  Sphincterotomy was performed and a biliary stent placed.  She subsequently was rehospitalized with abdominal pain and elevated liver test and underwent repeat ERCP with stent exchange April 29, 2021.  Biliary brushings were negative for neoplasia.  Abdominal ultrasound and ERCP were performed May 08, 2021.  Endoscopic ultrasound did not show any pancreatic abnormality.  No evidence for ampullary mass.  The distal bile duct was thickened.  Stent exchange performed (10 French/5 cm).  Biopsies were negative for neoplasia.  1 cytologic specimen was nondiagnostic.  However, 2 additional cytologic specimens were "suspicious for malignancy".  Dr. Rush Landmark had multiple detailed discussions with the patient and her husband.  The patient has dementia.  The husband elected against oncologic treatment or surgery.  We discussed the role of periodic prophylactic stent exchange.  The patient's husband was not interested in having anything further done.  However, I was able to convince him to bring his wife in today for a visit and blood work.  She did have blood work earlier today.  Her appetite is stable.  She has had no weight loss.  She is able to remain active though clearly has dementia.  Her husband assists her with activities that are more than basic.  There has been no abdominal pain, dark urine, or fevers.  Her last blood work was June 02, 2021.  At that time  liver tests were normal except for mildly elevated alkaline phosphatase of 126.  REVIEW OF SYSTEMS:  All non-GI ROS negative. Past Medical History:  Diagnosis Date   Biliary obstruction 03/2021    Past Surgical History:  Procedure Laterality Date   BILIARY BRUSHING  03/24/2021   Procedure: BILIARY BRUSHING;  Surgeon: Irene Shipper, MD;  Location: [facility] ENDOSCOPY;  Service: Endoscopy;;   BILIARY BRUSHING  04/29/2021   Procedure: BILIARY BRUSHING;  Surgeon: Irene Shipper, MD;  Location: [facility] ENDOSCOPY;  Service: Endoscopy;;   BILIARY BRUSHING  05/08/2021   Procedure: BILIARY BRUSHING;  Surgeon: Irving Copas., MD;  Location: [facility]OS[facility];  Service: Gastroenterology;;   BILIARY DILATION  05/08/2021   Procedure: BILIARY DILATION;  Surgeon: Irving Copas., MD;  Location: [facility] ENDOSCOPY;  Service: Gastroenterology;;   BILIARY STENT PLACEMENT  03/24/2021   Procedure: BILIARY STENT PLACEMENT;  Surgeon: Irene Shipper, MD;  Location: [facility] ENDOSCOPY;  Service: Endoscopy;;   BILIARY STENT PLACEMENT N/A 04/29/2021   Procedure: BILIARY STENT PLACEMENT;  Surgeon: Irene Shipper, MD;  Location: [facility] ENDOSCOPY;  Service: Endoscopy;  Laterality: N/A;   BILIARY STENT PLACEMENT N/A 05/08/2021   Procedure: BILIARY STENT PLACEMENT;  Surgeon: Rush Landmark Telford Nab., MD;  Location: [facility] ENDOSCOPY;  Service: Gastroenterology;  Laterality: N/A;   BIOPSY  03/24/2021   Procedure: BIOPSY;  Surgeon: Irene Shipper, MD;  Location: [place];  Service: Endoscopy;;   BIOPSY  05/08/2021   Procedure: BIOPSY;  Surgeon: Irving Copas., MD;  Location: [facility] ENDOSCOPY;  Service:  Gastroenterology;;   ENDOSCOPIC RETROGRADE CHOLANGIOPANCREATOGRAPHY (ERCP) WITH PROPOFOL N/A 03/24/2021   Procedure: ENDOSCOPIC RETROGRADE CHOLANGIOPANCREATOGRAPHY (ERCP) WITH PROPOFOL;  Surgeon: Irene Shipper, MD;  Location[facility];  Service: Endoscopy;  Laterality: N/A;   ENDOSCOPIC RETROGRADE CHOLANGIOPANCREATOGRAPHY (ERCP) WITH PROPOFOL N/A  05/08/2021   Procedure: ENDOSCOPIC RETROGRADE CHOLANGIOPANCREATOGRAPHY (ERCP) WITH PROPOFOL;  Surgeon: Rush Landmark Telford Nab., MD;  Location: [facility] ENDOSCOPY;  Service: Gastroenterology;  Laterality: N/A;   ERCP N/A 04/29/2021   Procedure: ENDOSCOPIC RETROGRADE CHOLANGIOPANCREATOGRAPHY (ERCP);  Surgeon: Irene Shipper, MD;  Location: [facility] ENDOSCOPY;  Service: Endoscopy;  Laterality: N/A;   EUS N/A 05/08/2021   Procedure: UPPER ENDOSCOPIC ULTRASOUND (EUS) RADIAL;  Surgeon: Rush Landmark Telford Nab., MD;  Location: [facility] ENDOSCOPY;  Service: Gastroenterology;  Laterality: N/A;   FINE NEEDLE ASPIRATION  05/08/2021   Procedure: FINE NEEDLE ASPIRATION (FNA) RADIAL;  Surgeon: Irving Copas., MD;  Location: [facility] ENDOSCOPY;  Service: Gastroenterology;;   REMOVAL OF STONES  05/08/2021   Procedure: REMOVAL OF STONES;  Surgeon: Irving Copas., MD;  Location: [facility]OSCOPY;  Service: Gastroenterology;;   Joan Mayans  03/24/2021   Procedure: Joan Mayans;  Surgeon: Irene Shipper, MD;  Location: [facility] ENDOSCOPY;  Service: Endoscopy;;   SPYGLASS CHOLANGIOSCOPY N/A 05/08/2021   Procedure: XTGGYIRS CHOLANGIOSCOPY;  Surgeon: Irving Copas., MD;  Location: [facility] ENDOSCOPY;  Service: Gastroenterology;  Laterality: N/A;   STENT REMOVAL  04/29/2021   Procedure: STENT REMOVAL;  Surgeon: Irene Shipper, MD;  Location: [facility] ENDOSCOPY;  Service: Endoscopy;;   STENT REMOVAL  05/08/2021   Procedure: STENT REMOVAL;  Surgeon: Irving Copas., MD;  Location: [facility] ENDOSCOPY;  Service: Gastroenterology;;    Social History Aundria Rud  reports that she has been smoking cigarettes. She has never used smokeless tobacco. No history on file for alcohol use and drug use.  family history includes Cancer in her mother; Cancer - Other in her father.  Allergies  Allergen Reactions   Sulfites Anaphylaxis   Iodine Other (See Comments)    welts       PHYSICAL EXAMINATION: Vital signs: BP 134/80 (BP Location: Left Arm,  Patient Position: Sitting, Cuff Size: Normal)    Pulse 84    Ht 4' 10.75" (1.492 m) Comment: height measured without shoes   Wt 112 lb 6 oz (51 kg)    LMP  (LMP Unknown)    BMI 22.89 kg/m   Constitutional: Thin but generally well-appearing, no acute distress Psychiatric: Pleasant and alert.  Clearly with dementia, cooperative Eyes: extraocular movements intact, anicteric, conjunctiva pink Mouth: oral pharynx moist, no lesions Neck: supple no lymphadenopathy Cardiovascular: heart regular rate and rhythm, no murmur Lungs: clear to auscultation bilaterally Abdomen: soft, nontender, nondistended, no obvious ascites, no peritoneal signs, normal bowel sounds, no organomegaly Rectal: Omitted Extremities: no clubbing, cyanosis, or lower extremity edema bilaterally Skin: no lesions on visible extremities Neuro: No focal deficits.  Cranial nerves intact  ASSESSMENT:  1.  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  Status post ERCP x3 and endoscopic ultrasound.  The stricture is concerning for malignant (suspicious cells on cytology).  No evidence for other abnormality such as ampullary or pancreatic tumor.  Her last biliary stent placement was May 08, 2021 (plastic 10 French/5 cm).  Currently asymptomatic. 2.  Dementia  PLAN:  1.  We discussed the role of prophylactic stent change. 2.  Patient's husband was interested in stent change on demand.  We discussed signs and symptoms such as recurrent jaundice, abdominal pain, fevers, and worsening confusion  as possible indicators for stent malfunction.  Should this occur, he has been instructed to contact this office AND take the patient to the emergency room for evaluation. 3.  Repeat liver test today 4.  If liver tests okay, repeat liver test monthly 5.  Routine office follow-up no later than 6 months A total time of 40 minutes was spent preparing to see the patient, reviewing a myriad of laboratory tests, endoscopic procedures,  x-rays, and pathology.  Also, obtaining interval history, performing comprehensive physical examination, counseling the patient and in particular her husband regarding her above listed issues.  Ordering blood work and arranging follow-up.  Finally, documenting clinical information in the health record  ADDENDUM: Patient's liver tests have returned as normal.  Patient's husband will be contacted.  We will repeat liver tests in 1 month.

## 2021-10-31 ENCOUNTER — Other Ambulatory Visit: Payer: Self-pay | Admitting: Internal Medicine

## 2021-10-31 DIAGNOSIS — K831 Obstruction of bile duct: Secondary | ICD-10-CM

## 2021-11-03 ENCOUNTER — Other Ambulatory Visit (INDEPENDENT_AMBULATORY_CARE_PROVIDER_SITE_OTHER): Payer: Medicare Other

## 2021-11-03 DIAGNOSIS — R7989 Other specified abnormal findings of blood chemistry: Secondary | ICD-10-CM | POA: Diagnosis not present

## 2021-11-03 DIAGNOSIS — K831 Obstruction of bile duct: Secondary | ICD-10-CM | POA: Diagnosis not present

## 2021-11-03 LAB — HEPATIC FUNCTION PANEL
ALT: 49 U/L — ABNORMAL HIGH (ref 0–35)
AST: 24 U/L (ref 0–37)
Albumin: 4.6 g/dL (ref 3.5–5.2)
Alkaline Phosphatase: 132 U/L — ABNORMAL HIGH (ref 39–117)
Bilirubin, Direct: 0.2 mg/dL (ref 0.0–0.3)
Total Bilirubin: 0.9 mg/dL (ref 0.2–1.2)
Total Protein: 7.7 g/dL (ref 6.0–8.3)

## 2021-11-06 ENCOUNTER — Other Ambulatory Visit: Payer: Self-pay

## 2021-11-06 DIAGNOSIS — R7989 Other specified abnormal findings of blood chemistry: Secondary | ICD-10-CM

## 2021-12-05 ENCOUNTER — Other Ambulatory Visit (INDEPENDENT_AMBULATORY_CARE_PROVIDER_SITE_OTHER): Payer: Medicare Other

## 2021-12-05 DIAGNOSIS — R7989 Other specified abnormal findings of blood chemistry: Secondary | ICD-10-CM | POA: Diagnosis not present

## 2021-12-06 ENCOUNTER — Other Ambulatory Visit: Payer: Self-pay

## 2021-12-06 DIAGNOSIS — R7989 Other specified abnormal findings of blood chemistry: Secondary | ICD-10-CM

## 2021-12-06 LAB — HEPATIC FUNCTION PANEL
ALT: 13 U/L (ref 0–35)
AST: 15 U/L (ref 0–37)
Albumin: 4.6 g/dL (ref 3.5–5.2)
Alkaline Phosphatase: 120 U/L — ABNORMAL HIGH (ref 39–117)
Bilirubin, Direct: 0.1 mg/dL (ref 0.0–0.3)
Total Bilirubin: 0.7 mg/dL (ref 0.2–1.2)
Total Protein: 8 g/dL (ref 6.0–8.3)

## 2022-01-03 ENCOUNTER — Encounter (HOSPITAL_COMMUNITY): Payer: Self-pay

## 2022-01-03 ENCOUNTER — Emergency Department (HOSPITAL_COMMUNITY): Payer: Medicare Other

## 2022-01-03 ENCOUNTER — Observation Stay (HOSPITAL_COMMUNITY)
Admission: EM | Admit: 2022-01-03 | Discharge: 2022-01-04 | Disposition: A | Discharge status: Home / Self Care | Payer: Medicare Other | Attending: Family Medicine | Admitting: Family Medicine

## 2022-01-03 ENCOUNTER — Other Ambulatory Visit (INDEPENDENT_AMBULATORY_CARE_PROVIDER_SITE_OTHER): Payer: Medicare Other

## 2022-01-03 ENCOUNTER — Other Ambulatory Visit: Payer: Self-pay

## 2022-01-03 DIAGNOSIS — T85510A Breakdown (mechanical) of bile duct prosthesis, initial encounter: Secondary | ICD-10-CM | POA: Insufficient documentation

## 2022-01-03 DIAGNOSIS — K831 Obstruction of bile duct: Secondary | ICD-10-CM | POA: Diagnosis not present

## 2022-01-03 DIAGNOSIS — Z8711 Personal history of peptic ulcer disease: Secondary | ICD-10-CM | POA: Insufficient documentation

## 2022-01-03 DIAGNOSIS — R17 Unspecified jaundice: Secondary | ICD-10-CM | POA: Insufficient documentation

## 2022-01-03 DIAGNOSIS — F17210 Nicotine dependence, cigarettes, uncomplicated: Secondary | ICD-10-CM | POA: Diagnosis not present

## 2022-01-03 DIAGNOSIS — Z800 Family history of malignant neoplasm of digestive organs: Secondary | ICD-10-CM | POA: Diagnosis not present

## 2022-01-03 DIAGNOSIS — R079 Chest pain, unspecified: Secondary | ICD-10-CM | POA: Diagnosis not present

## 2022-01-03 DIAGNOSIS — R112 Nausea with vomiting, unspecified: Secondary | ICD-10-CM | POA: Insufficient documentation

## 2022-01-03 DIAGNOSIS — R1011 Right upper quadrant pain: Secondary | ICD-10-CM | POA: Insufficient documentation

## 2022-01-03 DIAGNOSIS — E876 Hypokalemia: Secondary | ICD-10-CM | POA: Insufficient documentation

## 2022-01-03 DIAGNOSIS — T85858A Stenosis due to other internal prosthetic devices, implants and grafts, initial encounter: Secondary | ICD-10-CM | POA: Insufficient documentation

## 2022-01-03 DIAGNOSIS — R10811 Right upper quadrant abdominal tenderness: Secondary | ICD-10-CM | POA: Diagnosis not present

## 2022-01-03 DIAGNOSIS — Z20822 Contact with and (suspected) exposure to covid-19: Secondary | ICD-10-CM | POA: Diagnosis not present

## 2022-01-03 DIAGNOSIS — K838 Other specified diseases of biliary tract: Secondary | ICD-10-CM | POA: Diagnosis not present

## 2022-01-03 DIAGNOSIS — X58XXXD Exposure to other specified factors, subsequent encounter: Secondary | ICD-10-CM | POA: Insufficient documentation

## 2022-01-03 DIAGNOSIS — K5730 Diverticulosis of large intestine without perforation or abscess without bleeding: Secondary | ICD-10-CM | POA: Insufficient documentation

## 2022-01-03 DIAGNOSIS — Z4659 Encounter for fitting and adjustment of other gastrointestinal appliance and device: Secondary | ICD-10-CM | POA: Diagnosis not present

## 2022-01-03 DIAGNOSIS — K828 Other specified diseases of gallbladder: Secondary | ICD-10-CM | POA: Insufficient documentation

## 2022-01-03 DIAGNOSIS — F0390 Unspecified dementia without behavioral disturbance: Secondary | ICD-10-CM | POA: Insufficient documentation

## 2022-01-03 DIAGNOSIS — S3289XD Fracture of other parts of pelvis, subsequent encounter for fracture with routine healing: Secondary | ICD-10-CM | POA: Diagnosis not present

## 2022-01-03 DIAGNOSIS — Y732 Prosthetic and other implants, materials and accessory gastroenterology and urology devices associated with adverse incidents: Secondary | ICD-10-CM | POA: Insufficient documentation

## 2022-01-03 DIAGNOSIS — Z87311 Personal history of (healed) other pathological fracture: Secondary | ICD-10-CM | POA: Diagnosis not present

## 2022-01-03 DIAGNOSIS — S2243XD Multiple fractures of ribs, bilateral, subsequent encounter for fracture with routine healing: Secondary | ICD-10-CM | POA: Diagnosis not present

## 2022-01-03 DIAGNOSIS — R7989 Other specified abnormal findings of blood chemistry: Secondary | ICD-10-CM

## 2022-01-03 DIAGNOSIS — I700 Atherosclerosis of aorta: Secondary | ICD-10-CM | POA: Insufficient documentation

## 2022-01-03 LAB — COMPREHENSIVE METABOLIC PANEL
ALT: 130 U/L — ABNORMAL HIGH (ref 0–44)
AST: 35 U/L (ref 15–41)
Albumin: 3.6 g/dL (ref 3.5–5.0)
Alkaline Phosphatase: 161 U/L — ABNORMAL HIGH (ref 38–126)
Anion gap: 8 (ref 5–15)
BUN: 20 mg/dL (ref 8–23)
CO2: 21 mmol/L — ABNORMAL LOW (ref 22–32)
Calcium: 9.1 mg/dL (ref 8.9–10.3)
Chloride: 106 mmol/L (ref 98–111)
Creatinine, Ser: 0.51 mg/dL (ref 0.44–1.00)
GFR, Estimated: >60 mL/min (ref >60)
Glucose, Bld: 98 mg/dL (ref 70–99)
Potassium: 2.9 mmol/L — ABNORMAL LOW (ref 3.5–5.1)
Sodium: 135 mmol/L (ref 135–145)
Total Bilirubin: 5.3 mg/dL — ABNORMAL HIGH (ref 0.3–1.2)
Total Protein: 7.2 g/dL (ref 6.5–8.1)

## 2022-01-03 LAB — HEPATIC FUNCTION PANEL
ALT: 133 U/L — ABNORMAL HIGH (ref 0–35)
ALT: 129 U/L — ABNORMAL HIGH (ref 0–44)
AST: 34 U/L (ref 0–37)
AST: 35 U/L (ref 15–41)
Albumin: 3.8 g/dL (ref 3.5–5.2)
Albumin: 3.6 g/dL (ref 3.5–5.0)
Alkaline Phosphatase: 168 U/L — ABNORMAL HIGH (ref 39–117)
Alkaline Phosphatase: 157 U/L — ABNORMAL HIGH (ref 38–126)
Bilirubin, Direct: 3.3 mg/dL — ABNORMAL HIGH (ref 0.0–0.3)
Bilirubin, Direct: 3.3 mg/dL — ABNORMAL HIGH (ref 0.0–0.2)
Indirect Bilirubin: 1.6 mg/dL — ABNORMAL HIGH (ref 0.3–0.9)
Total Bilirubin: 5.4 mg/dL — ABNORMAL HIGH (ref 0.2–1.2)
Total Bilirubin: 4.9 mg/dL — ABNORMAL HIGH (ref 0.3–1.2)
Total Protein: 6.8 g/dL (ref 6.0–8.3)
Total Protein: 7.2 g/dL (ref 6.5–8.1)

## 2022-01-03 LAB — RESP PANEL BY RT-PCR (FLU A&B, COVID) ARPGX2
Influenza A by PCR: NEGATIVE
Influenza B by PCR: NEGATIVE
SARS Coronavirus 2 by RT PCR: NEGATIVE

## 2022-01-03 LAB — URINALYSIS, ROUTINE W REFLEX MICROSCOPIC
Glucose, UA: NEGATIVE mg/dL
Hgb urine dipstick: NEGATIVE
Ketones, ur: 15 mg/dL — AB
Nitrite: NEGATIVE
Specific Gravity, Urine: 1.02 (ref 1.005–1.030)
pH: 6 (ref 5.0–8.0)

## 2022-01-03 LAB — CBC WITH DIFFERENTIAL/PLATELET
Abs Immature Granulocytes: 0.02 10*3/uL (ref 0.00–0.07)
Basophils Absolute: 0 10*3/uL (ref 0.0–0.1)
Basophils Relative: 0 %
Eosinophils Absolute: 0.1 10*3/uL (ref 0.0–0.5)
Eosinophils Relative: 2 %
HCT: 37.8 % (ref 36.0–46.0)
Hemoglobin: 12.8 g/dL (ref 12.0–15.0)
Immature Granulocytes: 0 %
Lymphocytes Relative: 13 %
Lymphs Abs: 0.7 10*3/uL (ref 0.7–4.0)
MCH: 32 pg (ref 26.0–34.0)
MCHC: 33.9 g/dL (ref 30.0–36.0)
MCV: 94.5 fL (ref 80.0–100.0)
Monocytes Absolute: 0.3 10*3/uL (ref 0.1–1.0)
Monocytes Relative: 7 %
Neutro Abs: 3.9 10*3/uL (ref 1.7–7.7)
Neutrophils Relative %: 78 %
Platelets: 189 10*3/uL (ref 150–400)
RBC: 4 MIL/uL (ref 3.87–5.11)
RDW: 12.8 % (ref 11.5–15.5)
WBC: 5.1 10*3/uL (ref 4.0–10.5)
nRBC: 0 % (ref 0.0–0.2)

## 2022-01-03 LAB — URINALYSIS, MICROSCOPIC (REFLEX)

## 2022-01-03 LAB — PHOSPHORUS: Phosphorus: 3 mg/dL (ref 2.5–4.6)

## 2022-01-03 LAB — CK: Total CK: 32 U/L — ABNORMAL LOW (ref 38–234)

## 2022-01-03 LAB — LACTIC ACID, PLASMA: Lactic Acid, Venous: 0.8 mmol/L (ref 0.5–1.9)

## 2022-01-03 LAB — PROTIME-INR
INR: 1 (ref 0.8–1.2)
Prothrombin Time: 13 seconds (ref 11.4–15.2)

## 2022-01-03 LAB — MAGNESIUM: Magnesium: 2.1 mg/dL (ref 1.7–2.4)

## 2022-01-03 LAB — LIPASE, BLOOD: Lipase: 49 U/L (ref 11–51)

## 2022-01-03 LAB — GAMMA GT: GGT: 139 U/L — ABNORMAL HIGH (ref 7–50)

## 2022-01-03 LAB — AMMONIA: Ammonia: 20 umol/L (ref 9–35)

## 2022-01-03 MED ORDER — POTASSIUM CHLORIDE 10 MEQ/100ML IV SOLN
10.0000 meq | Freq: Once | INTRAVENOUS | Status: AC
  Administered 2022-01-03: 10 meq via INTRAVENOUS
  Filled 2022-01-03: qty 100

## 2022-01-03 MED ORDER — POTASSIUM CHLORIDE CRYS ER 20 MEQ PO TBCR
40.0000 meq | EXTENDED_RELEASE_TABLET | Freq: Once | ORAL | Status: AC
  Administered 2022-01-03: 40 meq via ORAL
  Filled 2022-01-03: qty 2

## 2022-01-03 MED ORDER — ONDANSETRON HCL 4 MG/2ML IJ SOLN
4.0000 mg | Freq: Once | INTRAMUSCULAR | Status: AC
  Administered 2022-01-03: 4 mg via INTRAVENOUS
  Filled 2022-01-03: qty 2

## 2022-01-03 MED ORDER — PIPERACILLIN-TAZOBACTAM 3.375 G IVPB 30 MIN
3.3750 g | Freq: Once | INTRAVENOUS | Status: AC
  Administered 2022-01-03: 3.375 g via INTRAVENOUS
  Filled 2022-01-03: qty 50

## 2022-01-03 MED ORDER — FENTANYL CITRATE PF 50 MCG/ML IJ SOSY: 12.5000 ug | PREFILLED_SYRINGE | INTRAMUSCULAR | Status: DC | PRN

## 2022-01-03 MED ORDER — SODIUM CHLORIDE 0.9 % IV SOLN
75.0000 mL/h | INTRAVENOUS | Status: AC
  Administered 2022-01-03: 75 mL/h via INTRAVENOUS

## 2022-01-03 MED ORDER — SODIUM CHLORIDE 0.9 % IV BOLUS
1000.0000 mL | Freq: Once | INTRAVENOUS | Status: AC
  Administered 2022-01-03: 1000 mL via INTRAVENOUS

## 2022-01-03 MED ORDER — FENTANYL CITRATE PF 50 MCG/ML IJ SOSY
50.0000 ug | PREFILLED_SYRINGE | Freq: Once | INTRAMUSCULAR | Status: DC
  Filled 2022-01-03: qty 1

## 2022-01-03 MED ORDER — PIPERACILLIN-TAZOBACTAM 3.375 G IVPB
3.3750 g | Freq: Three times a day (TID) | INTRAVENOUS | Status: DC
Start: 2022-01-03 — End: 2022-01-04
  Administered 2022-01-03 – 2022-01-04 (×3): 3.375 g via INTRAVENOUS
  Filled 2022-01-03 (×3): qty 50

## 2022-01-03 MED ORDER — HYDROCODONE-ACETAMINOPHEN 5-325 MG PO TABS: 1.0000 | ORAL_TABLET | ORAL | Status: DC | PRN

## 2022-01-03 NOTE — Progress Notes (Signed)
A consult was received from an ED physician for piperacillin/tazobactam per pharmacy dosing.  The patient's profile has been reviewed for ht/wt/allergies/indication/available labs.   ? ?Allergies  ?Allergen Reactions  ? Sulfites Anaphylaxis  ? Iodine Other (See Comments)  ?  welts  ? ? ?A one time order has been placed for piperacillin/tazobactam 3.375 g IV once infused over 30 minutes.   ? ?Further antibiotics/pharmacy consults should be ordered by admitting physician if indicated.       ?                ?Thank you, ?Cindi Carbon, PharmD ?01/03/2022  6:06 PM ? ?

## 2022-01-03 NOTE — ED Provider Notes (Addendum)
?[facility]-EMERGENCY DEPT ?Provider Note ? ? ?CSN: [id number] ?Arrival date & time: 01/03/22  1647 ? ?  ? ?History ? ?Chief Complaint  ?Patient presents with  ? Abdominal Pain  ? Vomiting  ? ? ?Alicia Spencer is a 81 y.o. female. ? ?The history is provided by the patient.  ?Abdominal Pain ?Pain location:  RUQ ?Pain quality: aching   ?Pain radiates to:  Does not radiate ?Pain severity:  Mild ?Onset quality:  Gradual ?Duration:  1 week ?Timing:  Intermittent ?Progression:  Waxing and waning ?Chronicity:  Recurrent ?Context: not previous surgeries   ?Relieved by:  Nothing ?Worsened by:  Nothing ?Associated symptoms: nausea and vomiting   ?Associated symptoms: no anorexia, no belching, no chest pain, no chills, no constipation, no cough, no diarrhea, no dysuria, no fatigue, no shortness of breath and no sore throat   ?Risk factors: has not had multiple surgeries   ? ?  ? ?Home Medications ?Prior to Admission medications   ?Medication Sig Start Date End Date Taking? Authorizing Provider  ?acetaminophen (TYLENOL) 325 MG tablet Take 650 mg by mouth as needed.    [provider]  ?   ? ?Allergies    ?Sulfites and Iodine   ? ?Review of Systems   ?Review of Systems  ?Constitutional:  Negative for chills and fatigue.  ?HENT:  Negative for sore throat.   ?Respiratory:  Negative for cough and shortness of breath.   ?Cardiovascular:  Negative for chest pain.  ?Gastrointestinal:  Positive for abdominal pain, nausea and vomiting. Negative for anorexia, constipation and diarrhea.  ?Genitourinary:  Negative for dysuria.  ? ?Physical Exam ?Updated Vital Signs ? ?ED Triage Vitals  ?Enc Vitals Group  ?   BP 01/03/22 1654 (!) 152/105  ?   Pulse Rate 01/03/22 1654 93  ?   Resp 01/03/22 1654 18  ?   Temp 01/03/22 1654 97.7 ?F (36.5 ?C)  ?   Temp Source 01/03/22 1654 Oral  ?   SpO2 01/03/22 1654 97 %  ?   Weight --   ?   Height --   ?   Head Circumference --   ?   Peak Flow --   ?   Pain Score 01/03/22 1658 6  ?    Pain Loc --   ?   Pain Edu? --   ?   Excl. in GC? --   ? ? ?Physical Exam ?Vitals and nursing note reviewed.  ?Constitutional:   ?   General: She is not in acute distress. ?   Appearance: She is well-developed. She is not ill-appearing.  ?HENT:  ?   Head: Normocephalic and atraumatic.  ?   Mouth/Throat:  ?   Mouth: Mucous membranes are moist.  ?Eyes:  ?   General: Scleral icterus present.  ?   Extraocular Movements: Extraocular movements intact.  ?   Conjunctiva/sclera: Conjunctivae normal.  ?Cardiovascular:  ?   Rate and Rhythm: Normal rate and regular rhythm.  ?   Heart sounds: Normal heart sounds. No murmur heard. ?Pulmonary:  ?   Effort: Pulmonary effort is normal. No respiratory distress.  ?   Breath sounds: Normal breath sounds.  ?Abdominal:  ?   Palpations: Abdomen is soft.  ?   Tenderness: There is abdominal tenderness in the right upper quadrant.  ?Musculoskeletal:     ?   General: No swelling.  ?   Cervical back: Neck supple.  ?Skin: ?   General: Skin is warm and  dry.  ?   Capillary Refill: Capillary refill takes less than 2 seconds.  ?Neurological:  ?   General: No focal deficit present.  ?   Mental Status: She is alert.  ?Psychiatric:     ?   Mood and Affect: Mood normal.  ? ? ?ED Results / Procedures / Treatments   ?Labs ?(all labs ordered are listed, but only abnormal results are displayed) ?Labs Reviewed  ?COMPREHENSIVE METABOLIC PANEL - Abnormal; Notable for the following components:  ?    Result Value  ? Potassium 2.9 (*)   ? CO2 21 (*)   ? ALT 130 (*)   ? Alkaline Phosphatase 161 (*)   ? Total Bilirubin 5.3 (*)   ? All other components within normal limits  ?RESP PANEL BY RT-PCR (FLU A&B, COVID) [id number]  ?CULTURE, BLOOD (ROUTINE X 2)  ?CULTURE, BLOOD (ROUTINE X 2)  ?CBC WITH DIFFERENTIAL/PLATELET  ?LIPASE, BLOOD  ?AMMONIA  ?URINALYSIS, ROUTINE W REFLEX MICROSCOPIC  ?PROTIME-INR  ? ? ?EKG ?None ? ?Radiology ?CT ABDOMEN PELVIS WO CONTRAST ? ?Result Date: 01/03/2022 ?CLINICAL DATA:  Elevated  bilirubin, abdominal pain, nausea and vomiting EXAM: CT ABDOMEN AND PELVIS WITHOUT CONTRAST TECHNIQUE: Multidetector CT imaging of the abdomen and pelvis was performed following the standard protocol without IV contrast. Unenhanced CT was performed per clinician order. Lack of IV contrast limits sensitivity and specificity, especially for evaluation of abdominal/pelvic solid viscera. RADIATION DOSE REDUCTION: This exam was performed according to the departmental dose-optimization program which includes automated exposure control, adjustment of the mA and/or kV according to patient size and/or use of iterative reconstruction technique. COMPARISON:  01/03/2022, 04/27/2021 FINDINGS: Lower chest: No acute pleural or parenchymal lung disease. Hepatobiliary: Gallbladder is markedly distended. Minimal echogenic material layering dependently consistent with noncalcified stones or sludge. No gallbladder wall thickening or pericholecystic fluid. There is chronic intrahepatic and extrahepatic biliary duct dilation. Indwelling stent within the distal common bile duct, extending into the duodenal lumen. Unenhanced imaging of the liver is unremarkable. Pancreas: Unremarkable unenhanced appearance. Spleen: Unremarkable unenhanced appearance. Adrenals/Urinary Tract: No urinary tract calculi or obstructive uropathy. The adrenals are unremarkable. Bladder is moderately distended without focal abnormality. Stomach/Bowel: No bowel obstruction or ileus. The appendix, if still present, is not well visualized. Scattered diverticulosis of the distal colon. No bowel wall thickening or inflammatory change to suggest diverticulitis. Vascular/Lymphatic: Aortic atherosclerosis. No enlarged abdominal or pelvic lymph nodes. Reproductive: Calcified uterine fibroids again identified. No adnexal masses. Other: No free fluid or free intraperitoneal gas. Bilateral fat containing inguinal hernias, left greater than right. No bowel herniation.  Musculoskeletal: Multiple healing bilateral rib fractures well as a left parasymphyseal pelvic fracture have developed in the interim, with moderate callus formation. Chronic right-sided sacral insufficiency fracture along the inferior aspect of the right sacral ala abutting the SI joint. There are no acute bony abnormalities. Stable anterolisthesis of L4 on L5. Reconstructed images demonstrate no additional findings. IMPRESSION: 1. Distended gallbladder, with minimal sludge or noncalcified gallstones. No gallbladder wall thickening or pericholecystic fluid to suggest acute cholecystitis. 2. Stable indwelling common bile duct stent, with persistent intrahepatic and extrahepatic biliary duct dilation. The persistent dilation despite stent placement may reflect underlying stent malfunction. 3. Distal colonic diverticulosis without diverticulitis. 4. Subacute healing fractures are seen within the bilateral ribs and left parasymphyseal region of the pelvis. Chronic right sacral insufficiency fracture. 5.  Aortic Atherosclerosis (ICD10-I70.0). Electronically Signed   By: Sharlet SalinaMichael  Brown M.D.   On: 01/03/2022 18:38  ? ?US Abdomen Limited RUQ (  LIVER/GB) ? ?Result Date: 01/03/2022 ?CLINICAL DATA:  Right upper quadrant pain. EXAM: ULTRASOUND ABDOMEN LIMITED RIGHT UPPER QUADRANT COMPARISON:  CT scan 04/27/2021. FINDINGS: Gallbladder: Gallbladder is distended with layering sludge and probable tiny stones. Gallbladder wall thickness upper normal to mildly increased. Sonographer reports no sonographic Murphy sign. Common bile duct: Diameter: Intra and extrahepatic biliary duct dilatation evident. Common bile duct measures up to 2.1 cm diameter. Apparent 18 mm stone is identified in the duct. Liver: No focal abnormality within the liver parenchyma. Portal vein is patent on color Doppler imaging with normal direction of blood flow towards the liver. Other: None. IMPRESSION: Intra and extrahepatic biliary duct dilatation with  probable 18 mm stone in the common bile duct. Gallbladder is distended with layering sludge and probable tiny stones. Gallbladder wall thickness is upper normal to mildly increased. No sonographic Murphy sign on today's stud

## 2022-01-03 NOTE — Assessment & Plan Note (Signed)
ER has discussed case with [facility] who will see patient in consult in a.m. make patient n.p.o. plan for possible ERCP tomorrow.  Given obstruction patient was started on Zosyn empirically ?

## 2022-01-03 NOTE — Subjective & Objective (Signed)
Came in with abdominal pain nausea and vomiting she came to her PCP for routine LFTs check and was noted to have elevated bilirubin suspected biliary obstruction she has a history of biliary stents that has had malfunction in the past. ?Symptoms been ongoing for the past 1 week but intermittent.  No associated chest pain no fevers or chills.  No cough no shortness of breath. ?She has been trying to use Tylenol for occasional pain.  Still able to tolerate p.o. ?

## 2022-01-03 NOTE — Progress Notes (Signed)
Pharmacy Antibiotic Note ? ?Alicia Spencer is a 81 y.o. female admitted on 01/03/2022 with  intra-abdominal infection .  Pharmacy has been consulted for piperacillin/tazobactam dosing. ? ?Today, 01/03/22 ?WBC WNL ?SCr WNL, Will round up to 1 for CrCl ~ 35 mL/min ?No weight entered for this admission. Ht/wt order entered. Use TBW = 49 kg recorded on 04/28/21 for dosing ? ?Plan: ?Piperacillin/tazobactam 3.375 g IV q8h EI ? ?  ? ?Temp (24hrs), Avg:97.7 ?F (36.5 ?C), Min:97.7 ?F (36.5 ?C), Max:97.7 ?F (36.5 ?C) ? ?Recent Labs  ?Lab 01/03/22 ?1657  ?WBC 5.1  ?CREATININE 0.51  ?  ?CrCl cannot be calculated (Unknown ideal weight.).   ? ?Allergies  ?Allergen Reactions  ? Sulfites Anaphylaxis  ? Iodine Other (See Comments)  ?  welts  ? ? ?Antimicrobials this admission: ?Piperacillin/tazobactam 3/15 >>  ? ?Dose adjustments this admission: ? ?Microbiology results: ?3/15 BCx:  ? ?Cindi Carbon, PharmD ?01/03/2022 7:58 PM ?

## 2022-01-03 NOTE — Assessment & Plan Note (Addendum)
Likely secondary to biliary obstruction ?

## 2022-01-03 NOTE — ED Triage Notes (Signed)
Pt endorses upper abdominal pain and N/V. Pt went today for routine check of LFT levels and was told to come here for further evaluation.  ?

## 2022-01-03 NOTE — H&P (Signed)
? ? ?Alicia BaltimoreGayle L Wagenaar ZOX:[id number] DOB: 07/07/1941 DOA: 01/03/2022 ?  ?  ?PCP: Barbie BannerWilson, Fred H, MD   ?Outpatient Specialists:   ?  ?GI Dr. Meridee ScoreMansouraty (   [facility])  ?Patient arrived to ER on 01/03/22 at 1647 ?Referred by Attending Virgina Norfolkuratolo, Adam, DO ? ? ?Patient coming from:   ? home Lives  With family ?  ? ?Chief Complaint:   ?Chief Complaint  ?Patient presents with  ? Abdominal Pain  ? Vomiting  ? ? ?HPI: ?Alicia Spencer is a 81 y.o. female with medical history significant of dementia biliary stricture ?  ? ?Presented with   abdominal pain nausea vomiting ?Came in with abdominal pain nausea and vomiting she came to her PCP for routine LFTs check and was noted to have elevated bilirubin suspected biliary obstruction she has a history of biliary stents that has had malfunction in the past. ?Symptoms been ongoing for the past 1 week but intermittent.  No associated chest pain no fevers or chills.  No cough no shortness of breath. ?She has been trying to use Tylenol for occasional pain.  Still able to tolerate p.o. ? ?Of note cholecystectomy has been offered to the patient/family in the past but they they did not wish to undergo any procedures at that or talk to a surgeon they also expressed that they did not want to undergo any chemotherapy or radiation therapy ?There is a likelihood of underlying cancer still thought to be extremely high ? Touched base regarding this again, Husband does not recollect any discussion and would like to stay focused on stent placement for now. ?Rec having further discussion in AM to clarify goals of care and extent of work up ? ? Initial COVID TEST  ?NEGATIVE  ? ?Lab Results  ?Component Value Date  ? SARSCOV2NAA NEGATIVE 01/03/2022  ? SARSCOV2NAA NEGATIVE 04/27/2021  ? SARSCOV2NAA NEGATIVE 03/22/2021  ? ?  ?Regarding pertinent Chronic problems:   ?  ? Dementia - not on meds ? ? Chronic anemia - baseline hg Hemoglobin & Hematocrit  ?Recent Labs  ?  04/29/21 ?0410 04/30/21 ?0423 01/03/22 ?1657   ?HGB 9.7* 10.6* 12.8  ?  ? ?While in ER: ?  ? ?Potassium 2.9 total bilirubin 4.3 ?Pain treated with fentanyl given potassium chloride 10 mEq as well as 40 p.o. and started on Zosyn ?[facility] was made aware ? ?Ordered ? ?US RUQ  ultrasound shows distended gallbladder but overall unremarkable study ?   ? ?CTabd/pelvis -   stable indwelling common bile duct stent with persistent dilation.  Suspect possibly underlying stent malfunction.  No signs to suggest cholecystitis at this time. ?  ? ?Following Medications were ordered in ER: ?Medications  ?fentaNYL (SUBLIMAZE) injection 50 mcg (50 mcg Intravenous Patient Refused/Not Given 01/03/22 1759)  ?potassium chloride 10 mEq in 100 mL IVPB (10 mEq Intravenous New Bag/Given 01/03/22 1906)  ?sodium chloride 0.9 % bolus 1,000 mL (0 mLs Intravenous Stopped 01/03/22 1912)  ?ondansetron (ZOFRAN) injection 4 mg (4 mg Intravenous Given 01/03/22 1756)  ?piperacillin-tazobactam (ZOSYN) IVPB 3.375 g (0 g Intravenous Stopped 01/03/22 1837)  ?potassium chloride SA (KLOR-CON M) CR tablet 40 mEq (40 mEq Oral Given 01/03/22 1907)  ?  ?_______________________________________________________ ?ER Provider Called:   [facility]  Dr. Marina GoodellPerry note and discussed with  Dr. Meridee ScoreMansouraty who states to make patient n.p.o. at midnight as likely ERCP tomorrow to evaluate for stent migration, clogged stent.  Continue IV Zosyn.  Overall low suspicion for cholangitis at this time  given no fever and white count.  Patient to be admitted to medicine for further care. ? lab work including CBC, lipase, INR, ammonia, right upper quadrant ultrasound. ?Will see in AM   ?  ?ED Triage Vitals  ?Enc Vitals Group  ?   BP 01/03/22 1654 (!) 152/105  ?   Pulse Rate 01/03/22 1654 93  ?   Resp 01/03/22 1654 18  ?   Temp 01/03/22 1654 97.7 ?F (36.5 ?C)  ?   Temp Source 01/03/22 1654 Oral  ?   SpO2 01/03/22 1654 97 %  ?   Weight --   ?   Height --   ?   Head Circumference --   ?   Peak Flow --   ?   Pain Score 01/03/22 1658 6  ?   Pain Loc --    ?   Pain Edu? --   ?   Excl. in GC? --   ?VEHM(09)@    ? _________________________________________ ?Significant initial  Findings: ?Abnormal Labs Reviewed  ?COMPREHENSIVE METABOLIC PANEL - Abnormal; Notable for the following components:  ?    Result Value  ? Potassium 2.9 (*)   ? CO2 21 (*)   ? ALT 130 (*)   ? Alkaline Phosphatase 161 (*)   ? Total Bilirubin 5.3 (*)   ? All other components within normal limits  ? ?   ? ?The recent clinical data is shown below. ?Vitals:  ? 01/03/22 1654 01/03/22 1856  ?BP: (!) 152/105 (!) 173/100  ?Pulse: 93 72  ?Resp: 18 16  ?Temp: 97.7 ?F (36.5 ?C)   ?TempSrc: Oral   ?SpO2: 97% 100%  ? ?  ?WBC ? ?   ?Component Value Date/Time  ? WBC 5.1 01/03/2022 1657  ? LYMPHSABS 0.7 01/03/2022 1657  ? MONOABS 0.3 01/03/2022 1657  ? EOSABS 0.1 01/03/2022 1657  ? BASOSABS 0.0 01/03/2022 1657  ?  ? ?Lactic Acid, Venous ?   ?Component Value Date/Time  ? LATICACIDVEN 1.0 04/28/2021 0706  ?  ? ? UA   no evidence of UTI    ?  ?Urine analysis: ?   ?Component Value Date/Time  ? COLORURINE AMBER (A) 01/03/2022 1852  ? APPEARANCEUR CLEAR 01/03/2022 1852  ? LABSPEC 1.020 01/03/2022 1852  ? PHURINE 6.0 01/03/2022 1852  ? GLUCOSEU NEGATIVE 01/03/2022 1852  ? HGBUR NEGATIVE 01/03/2022 1852  ? BILIRUBINUR MODERATE (A) 01/03/2022 1852  ? KETONESUR 15 (A) 01/03/2022 1852  ? PROTEINUR TRACE (A) 01/03/2022 1852  ? NITRITE NEGATIVE 01/03/2022 1852  ? LEUKOCYTESUR SMALL (A) 01/03/2022 1852  ? ? ?Results for orders placed or performed during the hospital encounter of 01/03/22  ?Resp Panel by RT-PCR (Flu A&B, Covid) Nasopharyngeal Swab     Status: None  ? Collection Time: 01/03/22  5:59 PM  ? Specimen: Nasopharyngeal Swab; Nasopharyngeal(NP) swabs in vial transport medium  ?Result Value Ref Range Status  ? SARS Coronavirus 2 by RT PCR NEGATIVE NEGATIVE Final  ?      ? Influenza A by PCR NEGATIVE NEGATIVE Final  ? Influenza B by PCR NEGATIVE NEGATIVE Final  ?      ? ?   ?_______________________________________________ ?Hospitalist was called for admission for hyperbilirubinemia secondary to biliary obstruction ? ?The following Work up has been ordered so far: ? ?Orders Placed This Encounter  ?Procedures  ? Procedural/ Surgical Case Request: ENDOSCOPIC RETROGRADE CHOLANGIOPANCREATOGRAPHY (ERCP)  ? Resp Panel by RT-PCR (Flu A&B, Covid) Nasopharyngeal Swab  ? Blood culture (routine x 2)  ?  US Abdomen Limited RUQ (LIVER/GB)  ? CT ABDOMEN PELVIS WO CONTRAST  ? CBC with Differential  ? Comprehensive metabolic panel  ? Lipase, blood  ? Urinalysis, Routine w reflex microscopic  ? Ammonia  ? Protime-INR  ? Consult to gastroenterology  ? Consult to hospitalist  ?  ? ?OTHER Significant initial  Findings: ? ?labs showing: ? ?  ?Recent Labs  ?Lab 01/03/22 ?1657  ?NA 135  ?K 2.9*  ?CO2 21*  ?GLUCOSE 98  ?BUN 20  ?CREATININE 0.51  ?CALCIUM 9.1  ? ? ?Cr   stable,   ?Lab Results  ?Component Value Date  ? CREATININE 0.51 01/03/2022  ? CREATININE 0.66 04/30/2021  ? CREATININE 0.43 (L) 04/28/2021  ? ? ?Recent Labs  ?Lab 01/03/22 ?1229 01/03/22 ?1657  ?AST 34 35  ?ALT 133* 130*  ?ALKPHOS 168* 161*  ?BILITOT 5.4* 5.3*  ?PROT 6.8 7.2  ?ALBUMIN 3.8 3.6  ? ?Lab Results  ?Component Value Date  ? CALCIUM 9.1 01/03/2022  ? PHOS 4.6 03/23/2021  ?  ?   ?Plt: ?Lab Results  ?Component Value Date  ? PLT 189 01/03/2022  ?  ?  ?COVID-19 Labs ? ?No results for input(s): DDIMER, FERRITIN, LDH, CRP in the last 72 hours. ? ?Lab Results  ?Component Value Date  ? SARSCOV2NAA NEGATIVE 04/27/2021  ? SARSCOV2NAA NEGATIVE 03/22/2021  ?  ?   ?Recent Labs  ?Lab 01/03/22 ?1657  ?WBC 5.1  ?NEUTROABS 3.9  ?HGB 12.8  ?HCT 37.8  ?MCV 94.5  ?PLT 189  ? ? ?HG/HCT   stable,   ?   ?Component Value Date/Time  ? HGB 12.8 01/03/2022 1657  ? HCT 37.8 01/03/2022 1657  ? MCV 94.5 01/03/2022 1657  ? ? ? ? ?Recent Labs  ?Lab 01/03/22 ?1657  ?LIPASE 49  ? ?Recent Labs  ?Lab 01/03/22 ?1707  ?AMMONIA 20  ? ?  ? ?Cardiac Panel (last 3 results) ?No  results for input(s): CKTOTAL, CKMB, TROPONINI, RELINDX in the last 72 hours. ? .car ?BNP (last 3 results) ?No results for input(s): BNP in the last 8760 hours. ?  ? ?DM  labs:  ?HbA1C: ?No results for input(s): HGBA1C in the last 8760 hours. ?  ?  ?C

## 2022-01-03 NOTE — ED Notes (Signed)
Patient alert with no complaints at this time.   ?

## 2022-01-03 NOTE — Assessment & Plan Note (Signed)
We will replace check magnesium level ?

## 2022-01-04 ENCOUNTER — Other Ambulatory Visit: Payer: Self-pay

## 2022-01-04 ENCOUNTER — Encounter (HOSPITAL_COMMUNITY)
Admission: EM | Disposition: A | Discharge status: Home / Self Care | Payer: Self-pay | Source: Home / Self Care | Attending: Emergency Medicine

## 2022-01-04 ENCOUNTER — Observation Stay (HOSPITAL_COMMUNITY): Payer: Medicare Other | Admitting: Anesthesiology

## 2022-01-04 ENCOUNTER — Observation Stay (HOSPITAL_COMMUNITY): Payer: Medicare Other

## 2022-01-04 ENCOUNTER — Observation Stay (HOSPITAL_BASED_OUTPATIENT_CLINIC_OR_DEPARTMENT_OTHER): Payer: Medicare Other | Admitting: Anesthesiology

## 2022-01-04 DIAGNOSIS — Z4659 Encounter for fitting and adjustment of other gastrointestinal appliance and device: Secondary | ICD-10-CM | POA: Diagnosis not present

## 2022-01-04 DIAGNOSIS — K831 Obstruction of bile duct: Secondary | ICD-10-CM

## 2022-01-04 DIAGNOSIS — S2249XA Multiple fractures of ribs, unspecified side, initial encounter for closed fracture: Secondary | ICD-10-CM

## 2022-01-04 DIAGNOSIS — R7989 Other specified abnormal findings of blood chemistry: Secondary | ICD-10-CM | POA: Diagnosis not present

## 2022-01-04 HISTORY — PX: ERCP: SHX5425

## 2022-01-04 HISTORY — PX: STENT REMOVAL: SHX6421

## 2022-01-04 HISTORY — PX: BILIARY STENT PLACEMENT: SHX5538

## 2022-01-04 LAB — PHOSPHORUS: Phosphorus: 3.1 mg/dL (ref 2.5–4.6)

## 2022-01-04 LAB — CBC WITH DIFFERENTIAL/PLATELET
Abs Immature Granulocytes: 0.02 10*3/uL (ref 0.00–0.07)
Basophils Absolute: 0 10*3/uL (ref 0.0–0.1)
Basophils Relative: 0 %
Eosinophils Absolute: 0.1 10*3/uL (ref 0.0–0.5)
Eosinophils Relative: 3 %
HCT: 38.6 % (ref 36.0–46.0)
Hemoglobin: 13 g/dL (ref 12.0–15.0)
Immature Granulocytes: 0 %
Lymphocytes Relative: 15 %
Lymphs Abs: 0.7 10*3/uL (ref 0.7–4.0)
MCH: 31.9 pg (ref 26.0–34.0)
MCHC: 33.7 g/dL (ref 30.0–36.0)
MCV: 94.6 fL (ref 80.0–100.0)
Monocytes Absolute: 0.5 10*3/uL (ref 0.1–1.0)
Monocytes Relative: 10 %
Neutro Abs: 3.4 10*3/uL (ref 1.7–7.7)
Neutrophils Relative %: 72 %
Platelets: 201 10*3/uL (ref 150–400)
RBC: 4.08 MIL/uL (ref 3.87–5.11)
RDW: 13 % (ref 11.5–15.5)
WBC: 4.8 10*3/uL (ref 4.0–10.5)
nRBC: 0 % (ref 0.0–0.2)

## 2022-01-04 LAB — COMPREHENSIVE METABOLIC PANEL
ALT: 118 U/L — ABNORMAL HIGH (ref 0–44)
AST: 30 U/L (ref 15–41)
Albumin: 3.7 g/dL (ref 3.5–5.0)
Alkaline Phosphatase: 162 U/L — ABNORMAL HIGH (ref 38–126)
Anion gap: 9 (ref 5–15)
BUN: 17 mg/dL (ref 8–23)
CO2: 19 mmol/L — ABNORMAL LOW (ref 22–32)
Calcium: 9 mg/dL (ref 8.9–10.3)
Chloride: 110 mmol/L (ref 98–111)
Creatinine, Ser: 0.51 mg/dL (ref 0.44–1.00)
GFR, Estimated: >60 mL/min (ref >60)
Glucose, Bld: 97 mg/dL (ref 70–99)
Potassium: 3.5 mmol/L (ref 3.5–5.1)
Sodium: 138 mmol/L (ref 135–145)
Total Bilirubin: 5.1 mg/dL — ABNORMAL HIGH (ref 0.3–1.2)
Total Protein: 7.1 g/dL (ref 6.5–8.1)

## 2022-01-04 LAB — TSH: TSH: 1.721 u[IU]/mL (ref 0.350–4.500)

## 2022-01-04 LAB — MAGNESIUM: Magnesium: 1.9 mg/dL (ref 1.7–2.4)

## 2022-01-04 LAB — LACTIC ACID, PLASMA: Lactic Acid, Venous: 0.7 mmol/L (ref 0.5–1.9)

## 2022-01-04 SURGERY — ERCP, WITH INTERVENTION IF INDICATED
Anesthesia: General

## 2022-01-04 MED ORDER — SODIUM CHLORIDE 0.9 % IV SOLN: INTRAVENOUS | Status: DC

## 2022-01-04 MED ORDER — FENTANYL CITRATE (PF) 100 MCG/2ML IJ SOLN
INTRAMUSCULAR | Status: DC | PRN
  Administered 2022-01-04: 50 ug via INTRAVENOUS

## 2022-01-04 MED ORDER — PHENYLEPHRINE HCL (PRESSORS) 10 MG/ML IV SOLN
INTRAVENOUS | Status: AC
  Filled 2022-01-04: qty 1

## 2022-01-04 MED ORDER — PROPOFOL 10 MG/ML IV BOLUS
INTRAVENOUS | Status: DC | PRN
  Administered 2022-01-04: 100 mg via INTRAVENOUS

## 2022-01-04 MED ORDER — PHENYLEPHRINE HCL-NACL 20-0.9 MG/250ML-% IV SOLN
INTRAVENOUS | Status: DC | PRN
  Administered 2022-01-04: 35 ug/min via INTRAVENOUS

## 2022-01-04 MED ORDER — PHENYLEPHRINE 40 MCG/ML (10ML) SYRINGE FOR IV PUSH (FOR BLOOD PRESSURE SUPPORT)
PREFILLED_SYRINGE | INTRAVENOUS | Status: DC | PRN
  Administered 2022-01-04: 40 ug via INTRAVENOUS
  Administered 2022-01-04: 80 ug via INTRAVENOUS

## 2022-01-04 MED ORDER — SUGAMMADEX SODIUM 200 MG/2ML IV SOLN
INTRAVENOUS | Status: DC | PRN
  Administered 2022-01-04: 200 mg via INTRAVENOUS

## 2022-01-04 MED ORDER — INDOMETHACIN 50 MG RE SUPP
RECTAL | Status: DC | PRN
  Administered 2022-01-04: 100 mg via RECTAL

## 2022-01-04 MED ORDER — FENTANYL CITRATE (PF) 100 MCG/2ML IJ SOLN
INTRAMUSCULAR | Status: AC
Start: 2022-01-04 — End: ?
  Filled 2022-01-04: qty 2

## 2022-01-04 MED ORDER — ONDANSETRON HCL 4 MG/2ML IJ SOLN
INTRAMUSCULAR | Status: DC | PRN
  Administered 2022-01-04: 4 mg via INTRAVENOUS

## 2022-01-04 MED ORDER — LACTATED RINGERS IV SOLN: INTRAVENOUS | Status: DC | PRN

## 2022-01-04 MED ORDER — ROCURONIUM BROMIDE 10 MG/ML (PF) SYRINGE
PREFILLED_SYRINGE | INTRAVENOUS | Status: DC | PRN
  Administered 2022-01-04: 35 mg via INTRAVENOUS

## 2022-01-04 MED ORDER — SODIUM CHLORIDE 0.9 % IV SOLN
INTRAVENOUS | Status: DC | PRN
  Administered 2022-01-04: 15 mL

## 2022-01-04 MED ORDER — LIDOCAINE 2% (20 MG/ML) 5 ML SYRINGE
INTRAMUSCULAR | Status: DC | PRN
  Administered 2022-01-04: 100 mg via INTRAVENOUS

## 2022-01-04 NOTE — Assessment & Plan Note (Addendum)
Secondary to stent malfunction. Outpatient repeat CMP. ?

## 2022-01-04 NOTE — Consult Note (Incomplete)
? ?                                                                                ?Consultation Note ?Date: 01/04/2022  ? ?Patient Name: Alicia Spencer  ?DOB: 09-Jul-1941  MRN: [id number]  Age / Sex: 81 y.o., female  ?PCP: [name], MD ?Referring Physician: Narda Bonds, MD ? ?Reason for Consultation: Establishing goals of care ? ?HPI/Patient Profile: 81 y.o. female  with past medical history of dementia, biliary stricture admitted on 01/03/2022 with abd pain, nausea, vomiting secondary to malfunction biliary stent with plan for ERCP to stent exchange. Noted high suspicion for malignancy but family have previously opted to avoid cholecystectomy/surgery and would not desire chemotherapy/radiation although unclear if goals remain the same.  ? ?Clinical Assessment and Goals of Care: ?*** ? ?Primary Decision Maker ?{Primary Decision XBLTJ:03009} ?  ? ?SUMMARY OF RECOMMENDATIONS   ?*** ? ?Code Status/Advance Care Planning: ?{Palliative Code status:23503} ? ? ?Symptom Management:  ?*** ? ?Palliative Prophylaxis:  ?{Palliative Prophylaxis:21015} ? ?Additional Recommendations (Limitations, Scope, Preferences): ?{Recommended Scope and Preferences:21019} ? ?Psycho-social/Spiritual:  ?Desire for further Chaplaincy support:{YES NO:22349} ?Additional Recommendations: {PAL SOCIAL:21064} ? ?Prognosis:  ?{Palliative Care Prognosis:23504} ? ?Discharge Planning: {Palliative dispostion:23505}  ? ?  ? ?Primary Diagnoses: ?Present on Admission: ? Biliary obstruction ? Elevated LFTs ? Hypokalemia ? ? ?I have reviewed the medical record, interviewed the patient and family, and examined the patient. The following aspects are pertinent. ? ?Past Medical History:  ?Diagnosis Date  ? Biliary obstruction 03/2021  ? ?Social History  ? ?Socioeconomic History  ? Marital status: Married  ?  Spouse name: Not on file  ? Number of children: Not on file  ? Years of education: Not on file  ? Highest education level: Not on file  ?Occupational  History  ? Not on file  ?Tobacco Use  ? Smoking status: Every Day  ?  Packs/day: 0.00  ?  Years: 15.00  ?  Pack years: 0.00  ?  Types: Cigarettes  ? Smokeless tobacco: Never  ? Tobacco comments:  ?  only smokes 2 cigarettes/day  ?Vaping Use  ? Vaping Use: Never used  ?Substance and Sexual Activity  ? Alcohol use: Not on file  ?  Comment: occasionally  ? Drug use: Not on file  ? Sexual activity: Not on file  ?Other Topics Concern  ? Not on file  ?Social History Narrative  ? Not on file  ? ?Social Determinants of Health  ? ?Financial Resource Strain: Not on file  ?Food Insecurity: Not on file  ?Transportation Needs: Not on file  ?Physical Activity: Not on file  ?Stress: Not on file  ?Social Connections: Not on file  ? ?Family History  ?Problem Relation Age of Onset  ? Cancer Mother   ?     kidney?  ? Cancer - Other Father   ? Colon cancer Neg Hx   ? Esophageal cancer Neg Hx   ? Stomach cancer Neg Hx   ? Pancreatic cancer Neg Hx   ? ?Scheduled Meds: ? [MAR Hold] fentaNYL (SUBLIMAZE) injection  50 mcg Intravenous Once  ? ?Continuous Infusions: ? sodium chloride    ? [  MAR Hold] piperacillin-tazobactam (ZOSYN)  IV 3.375 g (01/04/22 0500)  ? ?PRN Meds:.[MAR Hold] fentaNYL (SUBLIMAZE) injection, [MAR Hold] HYDROcodone-acetaminophen ?Allergies  ?Allergen Reactions  ? Sulfites Anaphylaxis  ? Iodine Other (See Comments)  ?  welts  ? ?Review of Systems ? ?Physical Exam ? ?Vital Signs: BP (!) 138/91 (BP Location: Right Arm)   Pulse 72   Temp 97.8 ?F (36.6 ?C) (Oral)   Resp 18   Ht 4' 10.75" (1.492 m)   Wt 50.2 kg   LMP  (LMP Unknown)   SpO2 99%   BMI 22.54 kg/m?  ?Pain Scale: 0-10 ?  ?Pain Score: 3  ? ? ?SpO2: SpO2: 99 % ?O2 Device:SpO2: 99 % ?O2 Flow Rate: .  ? ?IO: Intake/output summary:  ?Intake/Output Summary (Last 24 hours) at 01/04/2022 1347 ?Last data filed at 01/04/2022 0300 ?Gross per 24 hour  ?Intake 290 ml  ?Output --  ?Net 290 ml  ? ? ?LBM: Last BM Date : 01/03/22 (per pt) ?Baseline Weight: Weight: 50.2  kg ?Most recent weight: Weight: 50.2 kg     ?Palliative Assessment/Data: ? ? ? ? ?Time In: *** ?Time Out: *** ?Time Total: *** ?Greater than 50%  of this time was spent counseling and coordinating care related to the above assessment and plan. ? ?Signed by: ?Yong Channel, NP ?Palliative Medicine Team ?Pager # [phone] (M-F 8a-5p) ?Team Phone # [phone] (Nights/Weekends) ?  ? ? ? ? ? ? ? ? ? ? ? ? ?  ?

## 2022-01-04 NOTE — Anesthesia Preprocedure Evaluation (Addendum)
Anesthesia Evaluation  ?Patient identified by MRN, date of birth, ID band ?Patient awake and Patient confused ? ? ? ?Reviewed: ?Allergy & Precautions, NPO status , Patient's Chart, lab work & pertinent test results ? ?History of Anesthesia Complications ?Negative for: history of anesthetic complications ? ?Airway ?Mallampati: II ? ?TM Distance: >3 FB ?Neck ROM: Full ? ? ? Dental ?no notable dental hx. ? ?  ?Pulmonary ?Current Smoker and Patient abstained from smoking.,  ?  ?Pulmonary exam normal ? ? ? ? ? ? ? Cardiovascular ?negative cardio ROS ?Normal cardiovascular exam ? ? ?  ?Neuro/Psych ?Dementia negative neurological ROS ?   ? GI/Hepatic ?PUD, Obstructive jaundice ?Lab Results ?     Component                Value               Date                 ?     ALT                      118 (H)             01/04/2022           ?     AST                      30                  01/04/2022           ?     GGT                      139 (H)             01/03/2022           ?     ALKPHOS                  162 (H)             01/04/2022           ?     BILITOT                  5.1 (H)             01/04/2022           ? ?  ?Endo/Other  ?negative endocrine ROS ? Renal/GU ?negative Renal ROSLab Results ?     Component                Value               Date                 ?     CREATININE               0.51                01/04/2022           ?     K                        3.5                 01/04/2022           ?     ?  negative genitourinary ?  ?Musculoskeletal ?negative musculoskeletal ROS ?(+)  ? Abdominal ?  ?Peds ? Hematology ?negative hematology ROS ?(+) Lab Results ?     Component                Value               Date                       ?     HGB                      13.0                01/04/2022           ?       ?     PLT                      201                 01/04/2022           ?   ?Anesthesia Other Findings ?Day of surgery medications reviewed with patient. ?  Reproductive/Obstetrics ?negative OB ROS ? ?  ? ? ? ? ? ? ? ? ? ? ? ? ? ?  ?  ? ? ? ? ? ? ?Anesthesia Physical ?Anesthesia Plan ? ?ASA: 2 ? ?Anesthesia Plan: General  ? ?Post-op Pain Management: Minimal or no pain anticipated  ? ?Induction: Intravenous ? ?PONV Risk Score and Plan: 3 and Treatment may vary due to age or medical condition, Ondansetron and Dexamethasone ? ?Airway Management Planned: Oral ETT ? ?Additional Equipment: None ? ?Intra-op Plan:  ? ?Post-operative Plan: Extubation in OR ? ?Informed Consent: I have reviewed the patients History and Physical, chart, labs and discussed the procedure including the risks, benefits and alternatives for the proposed anesthesia with the patient or authorized representative who has indicated his/her understanding and acceptance.  ? ? ? ?Dental advisory given and Consent reviewed with POA ? ?Plan Discussed with: CRNA ? ?Anesthesia Plan Comments:   ? ? ? ? ? ?Anesthesia Quick Evaluation ? ?

## 2022-01-04 NOTE — Assessment & Plan Note (Addendum)
Subacute. Noted incidentally on CT imaging. Patient appears to be asymptomatic. ?

## 2022-01-04 NOTE — Anesthesia Postprocedure Evaluation (Signed)
Anesthesia Post Note ? ?Patient: Alicia Spencer ? ?Procedure(s) Performed: ENDOSCOPIC RETROGRADE CHOLANGIOPANCREATOGRAPHY (ERCP) ?STENT REMOVAL ?BILIARY STENT PLACEMENT ? ?  ? ?Patient location during evaluation: PACU ?Anesthesia Type: General ?Level of consciousness: awake and alert ?Pain management: pain level controlled ?Vital Signs Assessment: post-procedure vital signs reviewed and stable ?Respiratory status: spontaneous breathing, nonlabored ventilation, respiratory function stable and patient connected to nasal cannula oxygen ?Cardiovascular status: blood pressure returned to baseline and stable ?Postop Assessment: no apparent nausea or vomiting ?Anesthetic complications: no ? ? ?No notable events documented. ? ?Last Vitals:  ?Vitals:  ? 01/04/22 1545 01/04/22 1550  ?BP:  (!) 159/71  ?Pulse: 62 (!) 59  ?Resp: 19 18  ?Temp:    ?SpO2: 93% 92%  ?  ?Last Pain:  ?Vitals:  ? 01/04/22 1514  ?TempSrc: Temporal  ?PainSc: Asleep  ? ? ?  ?  ?  ?  ?  ?  ? ?Barnet [place] ? ? ? ? ?

## 2022-01-04 NOTE — Anesthesia Procedure Notes (Signed)
Procedure Name: Intubation ?Date/Time: 01/04/2022 2:23 PM ?Performed by: Maxwell Caul, CRNA ?Pre-anesthesia Checklist: Patient identified, Emergency Drugs available, Suction available and Patient being monitored ?Patient Re-evaluated:Patient Re-evaluated prior to induction ?Oxygen Delivery Method: Circle system utilized ?Preoxygenation: Pre-oxygenation with 100% oxygen ?Induction Type: IV induction ?Ventilation: Mask ventilation without difficulty ?Laryngoscope Size: Mac and 4 ?Grade View: Grade I ?Tube type: Oral ?Tube size: 7.0 mm ?Number of attempts: 1 ?Airway Equipment and Method: Stylet ?Placement Confirmation: ETT inserted through vocal cords under direct vision, positive ETCO2 and breath sounds checked- equal and bilateral ?Secured at: 21 cm ?Tube secured with: Tape ?Dental Injury: Teeth and Oropharynx as per pre-operative assessment  ? ? ? ? ?

## 2022-01-04 NOTE — Discharge Instructions (Signed)
Alicia Spencer, ? ?You were in the hospital with abdominal pain and found to have a malfunctioning biliary stent. This was exchanged. Please follow-up with your GI physician. ?

## 2022-01-04 NOTE — Progress Notes (Signed)
? ?PROGRESS NOTE ? ? ? ?REFUJIA Spencer  [id number] DOB: 12-08-1940 DOA: 01/03/2022 ?PCP: Christain Sacramento, MD ? ? ?Brief Narrative: ?Alicia Spencer is a 81 y.o. female with a history of dementia and biliary stricture. Patient presented secondary to abdominal pain and vomiting. CT imaging suggestive of malfunctioning biliary stent. GI consulted for ERCP. ? ? ?Assessment and Plan: ?* Biliary obstruction ?CT imaging significant for distended gallbladder with associated intrahepatic/extrahepatic biliary duct dilation, suggesting biliary duct stent malfunction. GI consulted with plan for ERCP. Cholecystectomy as been recommended, however patient/family declines. ? ?Hypokalemia ?Resolved with repletion. ? ?Elevated LFTs ?Secondary to stent malfunction. ? ?Rib fractures ?Subacute. Noted incidentally on CT imaging. Patient appears to be asymptomatic. ? ?Dementia ([place]) ?Not on medication management. ?-Delirium precautions ?-Sitter as needed ? ? ?DVT prophylaxis: SCDs ?Code Status:   Code Status: Full Code ?Family Communication: Husband at bedside ?Disposition Plan: Discharge home likely in 24 hours pending GI recommendations and management ? ? ?Consultants:  ?[facility] ? ?Procedures:  ?None ? ?Antimicrobials: ?Zosyn IV  ? ? ?Subjective: ?No issues this morning. No pain. ? ?Objective: ?BP 124/81   Pulse 73   Temp 98.1 ?F (36.7 ?C) (Oral)   Resp 20   Ht 4' 10.74" (1.492 m)   Wt 50.2 kg   LMP  (LMP Unknown)   SpO2 96%   BMI 22.55 kg/m?  ? ?Examination: ? ?General exam: Appears calm and comfortable ?Respiratory system: Clear to auscultation. Respiratory effort normal. ?Cardiovascular system: S1 & S2 heard, RRR. ?Gastrointestinal system: Abdomen is nondistended, soft and nontender. No organomegaly or masses felt. Normal bowel sounds heard. ?Central nervous system: Alert and oriented to self. ?Musculoskeletal: No calf tenderness ?Skin: No cyanosis. No rashes ?Psychiatry: Judgement and insight appear impaired. ? ?Data  Reviewed: I have personally reviewed following labs and imaging studies ? ?CBC ?Lab Results  ?Component Value Date  ? WBC 4.8 01/04/2022  ? RBC 4.08 01/04/2022  ? HGB 13.0 01/04/2022  ? HCT 38.6 01/04/2022  ? MCV 94.6 01/04/2022  ? MCH 31.9 01/04/2022  ? PLT 201 01/04/2022  ? MCHC 33.7 01/04/2022  ? RDW 13.0 01/04/2022  ? LYMPHSABS 0.7 01/04/2022  ? MONOABS 0.5 01/04/2022  ? EOSABS 0.1 01/04/2022  ? BASOSABS 0.0 01/04/2022  ? ? ? ?Last metabolic panel ?Lab Results  ?Component Value Date  ? NA 138 01/04/2022  ? K 3.5 01/04/2022  ? CL 110 01/04/2022  ? CO2 19 (L) 01/04/2022  ? BUN 17 01/04/2022  ? CREATININE 0.51 01/04/2022  ? GLUCOSE 97 01/04/2022  ? GFRNONAA >60 01/04/2022  ? CALCIUM 9.0 01/04/2022  ? PHOS 3.1 01/04/2022  ? PROT 7.1 01/04/2022  ? ALBUMIN 3.7 01/04/2022  ? BILITOT 5.1 (H) 01/04/2022  ? ALKPHOS 162 (H) 01/04/2022  ? AST 30 01/04/2022  ? ALT 118 (H) 01/04/2022  ? ANIONGAP 9 01/04/2022  ? ? ?GFR: ?Estimated Creatinine Clearance: 39 mL/min (by C-G formula based on SCr of 0.51 mg/dL). ? ?Recent Results (from the past 240 hour(s))  ?Blood culture (routine x 2)     Status: None (Preliminary result)  ? Collection Time: 01/03/22  5:48 PM  ? Specimen: BLOOD  ?Result Value Ref Range Status  ? Specimen Description   Final  ?  BLOOD BLOOD RIGHT FOREARM ?Performed at [facility], [place] [address], [place], [place] [address] ?  ? Special Requests   Final  ?  BOTTLES DRAWN AEROBIC AND ANAEROBIC Blood Culture adequate volume ?Performed at [facility], [place] [address], [place], [place] [address] ?  ? Culture   Final  ?  NO GROWTH < 12 HOURS ?Performed at [facility], [place] [address] [place], [place] [address] ?  ? Report Status PENDING  Incomplete  ?Resp Panel by RT-PCR (Flu A&B, Covid) Nasopharyngeal Swab     Status: None  ? Collection Time: 01/03/22  5:59 PM  ? Specimen: Nasopharyngeal Swab; Nasopharyngeal(NP) swabs in vial transport medium  ?Result Value Ref Range Status   ? SARS Coronavirus 2 by RT PCR NEGATIVE NEGATIVE Final  ?  Comment: (NOTE) ?SARS-CoV-2 target nucleic acids are NOT DETECTED. ? ?The SARS-CoV-2 RNA is generally detectable in upper respiratory ?specimens during the acute phase of infection. The lowest ?concentration of SARS-CoV-2 viral copies this assay can detect is ?138 copies/mL. A negative result does not preclude SARS-Cov-2 ?infection and should not be used as the sole basis for treatment or ?other patient management decisions. A negative result may occur with  ?improper specimen collection/handling, submission of specimen other ?than nasopharyngeal swab, presence of viral mutation(s) within the ?areas targeted by this assay, and inadequate number of viral ?copies(<138 copies/mL). A negative result must be combined with ?clinical observations, patient history, and epidemiological ?information. The expected result is Negative. ? ?Fact Sheet for Patients:  ?EntrepreneurPulse.com.au ? ?Fact Sheet for Healthcare Providers:  ?IncredibleEmployment.be ? ?This test is no t yet approved or cleared by the [place] FDA and  ?has been authorized for detection and/or diagnosis of SARS-CoV-2 by ?FDA under an Emergency Use Authorization (EUA). This EUA will remain  ?in effect (meaning this test can be used) for the duration of the ?COVID-19 declaration under Section 564(b)(1) of the Act, 21 ?U.S.C.section 360bbb-3(b)(1), unless the authorization is terminated  ?or revoked sooner.  ? ? ?  ? Influenza A by PCR NEGATIVE NEGATIVE Final  ? Influenza B by PCR NEGATIVE NEGATIVE Final  ?  Comment: (NOTE) ?The Xpert Xpress SARS-CoV-2/FLU/RSV plus assay is intended as an aid ?in the diagnosis of influenza from Nasopharyngeal swab specimens and ?should not be used as a sole basis for treatment. Nasal washings and ?aspirates are unacceptable for Xpert Xpress SARS-CoV-2/FLU/RSV ?testing. ? ?Fact Sheet for  Patients: ?EntrepreneurPulse.com.au ? ?Fact Sheet for Healthcare Providers: ?IncredibleEmployment.be ? ?This test is not yet approved or cleared by the [place] FDA and ?has been authorized for detection and/or diagnosis of SARS-CoV-2 by ?FDA under an Emergency Use Authorization (EUA). This EUA will remain ?in effect (meaning this test can be used) for the duration of the ?COVID-19 declaration under Section 564(b)(1) of the Act, 21 U.S.C. ?section 360bbb-3(b)(1), unless the authorization is terminated or ?revoked. ? ?Performed at [facility], [address] [address] ?[place], [place] [address] ?  ?Blood culture (routine x 2)     Status: None (Preliminary result)  ? Collection Time: 01/03/22  6:11 PM  ? Specimen: BLOOD  ?Result Value Ref Range Status  ? Specimen Description   Final  ?  BLOOD SITE NOT SPECIFIED ?Performed at [facility], [place] [address], [place], [place] [address] ?  ? Special Requests   Final  ?  BOTTLES DRAWN AEROBIC ONLY Blood Culture results may not be optimal due to an inadequate volume of blood received in culture bottles ?Performed at [facility], [place] [address], [place],  [address] ?  ? Culture   Final  ?  NO GROWTH < 12 HOURS ?Performed at [facility], [place]  [address] [place], [place] [address] ?  ? Report Status PENDING  Incomplete  ?  ? ? ?Radiology Studies: ?CT ABDOMEN PELVIS WO CONTRAST ? ?Result Date: 01/03/2022 ?CLINICAL DATA:  Elevated bilirubin, abdominal pain, nausea and vomiting EXAM: CT ABDOMEN AND PELVIS WITHOUT CONTRAST TECHNIQUE: Multidetector CT imaging of the abdomen and pelvis was performed following the standard protocol without IV contrast. Unenhanced CT was performed per clinician order. Lack of IV contrast limits sensitivity and specificity, especially for evaluation of abdominal/pelvic solid viscera. RADIATION DOSE REDUCTION: This exam was performed according to the departmental  dose-optimization program which includes automated exposure control, adjustment of the mA and/or kV according to patient size and/or use of iterative reconstruction technique. COMPARISON:  01/03/2022, 04/27/2021 FINDINGS: Lower chest: No acute pleural or pa

## 2022-01-04 NOTE — Transfer of Care (Signed)
Immediate Anesthesia Transfer of Care Note ? ?Patient: Alicia Spencer ? ?Procedure(s) Performed: ENDOSCOPIC RETROGRADE CHOLANGIOPANCREATOGRAPHY (ERCP) ?STENT REMOVAL ?BILIARY STENT PLACEMENT ? ?Patient Location: PACU and Endoscopy Unit ? ?Anesthesia Type:General ? ?Level of Consciousness: awake, alert  and oriented ? ?Airway & Oxygen Therapy: Patient Spontanous Breathing and Patient connected to face mask oxygen ? ?Post-op Assessment: Report given to RN and Post -op Vital signs reviewed and stable ? ?Post vital signs: Reviewed and stable ? ?Last Vitals:  ?Vitals Value Taken Time  ?BP 161/82 01/04/22 1514  ?Temp    ?Pulse    ?Resp 22 01/04/22 1516  ?SpO2    ?Vitals shown include unvalidated device data. ? ?Last Pain:  ?Vitals:  ? 01/04/22 1337  ?TempSrc: Oral  ?PainSc: 0-No pain  ?   ? ?  ? ?Complications: No notable events documented. ?

## 2022-01-04 NOTE — Assessment & Plan Note (Addendum)
Not on medication management. 

## 2022-01-04 NOTE — Hospital Course (Signed)
Alicia Spencer is a 81 y.o. female with a history of dementia and biliary stricture. Patient presented secondary to abdominal pain and vomiting. CT imaging suggestive of malfunctioning biliary stent. GI consulted for ERCP. ?

## 2022-01-04 NOTE — Discharge Summary (Signed)
?Physician Discharge Summary ?  ?Patient: Alicia Spencer MRN: [id number] DOB: October 13, 1941  ?Admit date:     01/03/2022  ?Discharge date: 01/04/22  ?Discharge Physician: Jacquelin Hawking, MD  ? ?PCP: [name], MD  ? ?Recommendations at discharge:  ? ?Outpatient follow-up with PCP and GI ?Repeat CMP in 1 week with GI ? ?Discharge Diagnoses: ?Principal Problem: ?  Biliary obstruction ?Active Problems: ?  Elevated LFTs ?  Hypokalemia ?  Dementia (H[facility]) ?  Rib fractures ?  Common bile duct (CBD) obstruction ? ?Resolved Problems: ?  * No resolved hospital problems. * ? ?Hospital Course: ?Alicia Spencer is a 81 y.o. female with a history of dementia and biliary stricture. Patient presented secondary to abdominal pain and vomiting. CT imaging suggestive of malfunctioning biliary stent. GI consulted for ERCP. ? ?Assessment and Plan: ?* Biliary obstruction ?CT imaging significant for distended gallbladder with associated intrahepatic/extrahepatic biliary duct dilation, suggesting biliary duct stent malfunction. GI consulted with plan for ERCP. Cholecystectomy as been recommended, however patient/family declines. ERCP performed on 3/16, significant for occluded biliary stent, which was replaced with an uncovered metal stent. GI cleared patient for discharge post-ERCP. ? ?Hypokalemia ?Resolved with repletion. ? ?Elevated LFTs ?Secondary to stent malfunction. Outpatient repeat CMP. ? ?Rib fractures ?Subacute. Noted incidentally on CT imaging. Patient appears to be asymptomatic. ? ?Dementia (HCC) ?Not on medication management. ? ? ? ? ?  ? ? ?Consultants: [facility] ?Procedures performed: ERCP  ?Disposition: Home ?Diet recommendation:  ?Regular diet ? ?DISCHARGE MEDICATION: ?Allergies as of 01/04/2022   ? ?   Reactions  ? Sulfites Anaphylaxis  ? Iodine Other (See Comments)  ? welts  ? ?  ? ?  ?Medication List  ?  ? ?TAKE these medications   ? ?acetaminophen 325 MG tablet ?Commonly known as: TYLENOL ?Take 650 mg by mouth every 6  (six) hours as needed for moderate pain. ?  ?cholecalciferol 25 MCG (1000 UNIT) tablet ?Commonly known as: VITAMIN D3 ?Take 1,000 Units by mouth daily. ?  ?vitamin B-12 1000 MCG tablet ?Commonly known as: CYANOCOBALAMIN ?Take 1,000 mcg by mouth daily. ?  ? ?  ? ? Follow-up Information   ? ? [name], MD. Schedule an appointment as soon as possible for a visit in 1 week(s).   ?Specialty: Family Medicine ?Why: For hospital follow-up ?Contact information: ?[address] ?[place] [address] ? ? ?  ?  ? ? [name], MD. Schedule an appointment as soon as possible for a visit in 1 week(s).   ?Specialty: Gastroenterology ?Why: For hospital follow-up. Labs ?Contact information: ?[address] ?[place] [address] ?[phone] ? ? ?  ?  ? ?  ?  ? ?  ? ?Discharge Exam: ?Filed Weights  ? 01/04/22 0344 01/04/22 1337  ?Weight: 50.2 kg 50.2 kg  ? ?General exam: Appears calm and comfortable ?Respiratory system: Clear to auscultation. Respiratory effort normal. ?Cardiovascular system: S1 & S2 heard, RRR. No murmurs, rubs, gallops or clicks. ?Gastrointestinal system: Abdomen is nondistended, soft and nontender. No organomegaly or masses felt. Normal bowel sounds heard. ?Central nervous system: Alert and oriented to self only. No focal neurological deficits. ?Musculoskeletal: No edema. No calf tenderness ?Skin: No cyanosis. No rashes ?Psychiatry: Judgement and insight appear impaired. ? ?Condition at discharge: stable ? ?The results of significant diagnostics from this hospitalization (including imaging, microbiology, ancillary and laboratory) are listed below for reference.  ? ?Imaging Studies: ?CT ABDOMEN PELVIS WO CONTRAST ? ?Result Date:  01/03/2022 ?CLINICAL DATA:  Elevated bilirubin, abdominal pain, nausea and vomiting EXAM: CT ABDOMEN AND PELVIS WITHOUT CONTRAST TECHNIQUE: Multidetector CT imaging of the abdomen and pelvis was performed following the standard protocol without IV contrast. Unenhanced CT was  performed per clinician order. Lack of IV contrast limits sensitivity and specificity, especially for evaluation of abdominal/pelvic solid viscera. RADIATION DOSE REDUCTION: This exam was performed according to the departmental dose-optimization program which includes automated exposure control, adjustment of the mA and/or kV according to patient size and/or use of iterative reconstruction technique. COMPARISON:  01/03/2022, 04/27/2021 FINDINGS: Lower chest: No acute pleural or parenchymal lung disease. Hepatobiliary: Gallbladder is markedly distended. Minimal echogenic material layering dependently consistent with noncalcified stones or sludge. No gallbladder wall thickening or pericholecystic fluid. There is chronic intrahepatic and extrahepatic biliary duct dilation. Indwelling stent within the distal common bile duct, extending into the duodenal lumen. Unenhanced imaging of the liver is unremarkable. Pancreas: Unremarkable unenhanced appearance. Spleen: Unremarkable unenhanced appearance. Adrenals/Urinary Tract: No urinary tract calculi or obstructive uropathy. The adrenals are unremarkable. Bladder is moderately distended without focal abnormality. Stomach/Bowel: No bowel obstruction or ileus. The appendix, if still present, is not well visualized. Scattered diverticulosis of the distal colon. No bowel wall thickening or inflammatory change to suggest diverticulitis. Vascular/Lymphatic: Aortic atherosclerosis. No enlarged abdominal or pelvic lymph nodes. Reproductive: Calcified uterine fibroids again identified. No adnexal masses. Other: No free fluid or free intraperitoneal gas. Bilateral fat containing inguinal hernias, left greater than right. No bowel herniation. Musculoskeletal: Multiple healing bilateral rib fractures well as a left parasymphyseal pelvic fracture have developed in the interim, with moderate callus formation. Chronic right-sided sacral insufficiency fracture along the inferior aspect of  the right sacral ala abutting the SI joint. There are no acute bony abnormalities. Stable anterolisthesis of L4 on L5. Reconstructed images demonstrate no additional findings. IMPRESSION: 1. Distended gallbladder, with minimal sludge or noncalcified gallstones. No gallbladder wall thickening or pericholecystic fluid to suggest acute cholecystitis. 2. Stable indwelling common bile duct stent, with persistent intrahepatic and extrahepatic biliary duct dilation. The persistent dilation despite stent placement may reflect underlying stent malfunction. 3. Distal colonic diverticulosis without diverticulitis. 4. Subacute healing fractures are seen within the bilateral ribs and left parasymphyseal region of the pelvis. Chronic right sacral insufficiency fracture. 5.  Aortic Atherosclerosis (ICD10-I70.0). Electronically Signed   By: Randa Ngo M.D.   On: 01/03/2022 18:38  ? ?DG ERCP ? ?Result Date: 01/04/2022 ?CLINICAL DATA:  Elevated bilirubin Abdominal pain Nausea Vomiting EXAM: ERCP TECHNIQUE: Multiple spot images obtained with the fluoroscopic device and submitted for interpretation post-procedure. FLUOROSCOPY: Radiation Exposure Index (as provided by the fluoroscopic device): 30 mGy Kerma COMPARISON:  CT abdomen pelvis 01/03/2022 FINDINGS: Twenty-six intraoperative fluoroscopic images were submitted for interpretation. The submitted images initially demonstrate a common bile duct stent in place which is then removed. There is opacification of intra and extrahepatic bile ducts. The common bile duct appears mildly dilated. No definite filling defects are identified within the common bile duct. Final images demonstrate a metallic stent in place. IMPRESSION: ERCP images above. These images were submitted for radiologic interpretation only. Please see the procedural report for the amount of contrast and the fluoroscopy time utilized. Electronically Signed   By: Miachel Roux M.D.   On: 01/04/2022 15:20  ? ?US Abdomen  Limited RUQ (LIVER/GB) ? ?Result Date: 01/03/2022 ?CLINICAL DATA:  Right upper quadrant pain. EXAM: ULTRASOUND ABDOMEN LIMITED RIGHT UPPER QUADRANT COMPARISON:  CT scan 04/27/2021. FINDINGS: Gallbladder: Donnamae Jude

## 2022-01-04 NOTE — Assessment & Plan Note (Signed)
Resolved with repletion. ?

## 2022-01-04 NOTE — Progress Notes (Signed)
RN explained and provided discharge paperwork to husband, Herbie [place]. IV removed. Tele removed. VSS. Pt awake and alert. Pt dressed in personal clothing and taken to the main entrance via wheelchair.  ?

## 2022-01-04 NOTE — Op Note (Signed)
[facility] ?Patient Name: Alicia Spencer ?Procedure Date: 01/04/2022 ?MRN: [id number] ?Attending MD: Wilhemina Bonito. Marina Goodell , MD ?Date of Birth: 1941-03-16 ?CSN: [id number] ?Age: 81 ?Admit Type: Inpatient ?Procedure:                ERCP with biliary stent removal followed by SEMS  ?                          placement ?Indications:              Malignant stricture of the common bile duct,  ?                          Biliary stent removal ?Providers:                Wilhemina Bonito. Marina Goodell, MD, Norman Clay, RN, Rodman Pickle  ?                          [place], Technician, [employer],  ?                          Technician ?Referring MD:              ?Medicines:                General Anesthesia ?Complications:            No immediate complications. ?Estimated Blood Loss:     Estimated blood loss: none. ?Procedure:                Pre-Anesthesia Assessment: ?                          - Prior to the procedure, a History and Physical  ?                          was performed, and patient medications and  ?                          allergies were reviewed. The patient's tolerance of  ?                          previous anesthesia was also reviewed. The risks  ?                          and benefits of the procedure and the sedation  ?                          options and risks were discussed with the patient.  ?                          All questions were answered, and informed consent  ?                          was obtained. Prior Anticoagulants: The patient has  ?                          taken no previous anticoagulant  or antiplatelet  ?                          agents. ASA Grade Assessment: III - A patient with  ?                          severe systemic disease. After reviewing the risks  ?                          and benefits, the patient was deemed in  ?                          satisfactory condition to undergo the procedure. ?                          After obtaining informed consent, the scope was  ?                           passed under direct vision. Throughout the  ?                          procedure, the patient's blood pressure, pulse, and  ?                          oxygen saturations were monitored continuously. The  ?                          [id number]-[id number] ([id number] Olympus duodenoscope was  ?                          introduced through the mouth, and used to inject  ?                          contrast into and used to cannulate the bile duct.  ?                          The ERCP was accomplished without difficulty. The  ?                          patient tolerated the procedure well. Preoperative  ?                          antibiotics were administered. ?Scope In: ?Scope Out: ?Findings: ?     ENDOSCOPIC EXAM: ?     1. Esophagus was not examined. Stomach was unremarkable. The duodenal  ?     bulb and postbulbar duodenum revealed scattered superficial erosions.  ?     The previously placed biliary stent was observed in the duodenum and in  ?     normal position. ?     X-ray EXAM: ?     1. A biliary stent was visible on the scout film. This was removed with  ?     an endoscopic snare. ?     2. Injection of contrast yielded a diffusely dilated biliary system with  ?     an abrupt cut  off of the duct in the region of the closest distal portion ?     3. One 10 Fr by 6 cm uncovered metal stent was placed into the common  ?     bile duct. The stent was in good position. Excellent drainage ?Impression:               1. Neoplastic distal biliary stricture with bile  ?                          duct obstruction. Previous plastic biliary stent  ?                          was occluded and removed. ?                          2. 10 French 6 cm uncovered metal stent was placed  ?                          into the bile duct in good position with good  ?                          drainage ?Moderate Sedation: ?     none ?Recommendation:           1. Standard post ERCP care ?                          2. Okay for patient to go home later  today with her  ?                          husband if she is doing well ?                          3.. Will need follow-up liver test in my office  ?                          next week ?                          Discussed with husband and provided him a copy of  ?                          this report. ?Procedure Code(s):        --- Professional --- ?                          [phone], Endoscopic retrograde  ?                          cholangiopancreatography (ERCP); with removal and  ?                          exchange of stent(s), biliary or pancreatic duct,  ?                          including pre- and post-dilation and guide wire  ?  passage, when performed, including sphincterotomy,  ?                          when performed, each stent exchanged ?Diagnosis Code(s):        --- Professional --- ?                          N86.76, Encounter for fitting and adjustment of  ?                          other gastrointestinal appliance and device ?                          K83.1, Obstruction of bile duct ?CPT copyright 2019 American Medical Association. All rights reserved. ?The codes documented in this report are preliminary and upon coder review may  ?be revised to meet current compliance requirements. ?Wilhemina Bonito. Marina Goodell, MD ?01/04/2022 3:23:17 PM ?This report has been signed electronically. ?Number of Addenda: 0 ?

## 2022-01-04 NOTE — Progress Notes (Signed)
Initial Nutrition Assessment ? ?DOCUMENTATION CODES:  ? ?Not applicable ? ?INTERVENTION:  ? ?- RD will follow for diet advancement and order oral nutrition supplements as appropriate ? ?NUTRITION DIAGNOSIS:  ? ?Inadequate oral intake related to inability to eat as evidenced by NPO status. ? ?GOAL:  ? ?Patient will meet greater than or equal to 90% of their needs ? ?MONITOR:  ? ?Diet advancement, Weight trends, Labs ? ?REASON FOR ASSESSMENT:  ? ?Consult ?Assessment of nutrition requirement/status ? ?ASSESSMENT:  ? ?81 year old female who presented to the ED on 3/15 with upper abdominal pain and N/V. PMH of dementia, malignant biliary stricture with indwelling stent. ? ?RD working remotely. ? ?Per GI note, plan is for ERCP with likely biliary stent exchange either today or tomorrow. Pt has decided against surgery. Pt is NPO at this time pending procedure. ? ?RD was unable to reach pt/family via phone call to room. Unable to obtain diet and weight history at this time. Reviewed available weight history in chart. Pt's weight has fluctuated between 49-54 kg over the last 9 months. Pt with a 0.8 kg weight loss since 10/05/21. This is a 1.6% weight loss in 3 months which is not clinically significant for timeframe. ? ?Pt is at risk for malnutrition and may currently meet criteria but RD unable to confirm without diet history or NFPE. Will monitor for diet advancement and order oral nutrition supplements as appropriate. ? ?Medications reviewed and include: IV abx ? ?Labs reviewed. ? ?NUTRITION - FOCUSED PHYSICAL EXAM: ? ?Unable to complete at this time. RD working remotely. ? ?Diet Order:   ?Diet Order   ? ?       ?  Diet NPO time specified  Diet effective now       ?  ? ?  ?  ? ?  ? ? ?EDUCATION NEEDS:  ? ?Not appropriate for education at this time ? ?Skin:  Skin Assessment: Reviewed RN Assessment ? ?Last BM:  01/03/22 per pt ? ?Height:  ? ?Ht Readings from Last 1 Encounters:  ?01/04/22 4' 10.75" (1.492 m)  ? ? ?Weight:   ? ?Wt Readings from Last 1 Encounters:  ?01/04/22 50.2 kg  ? ? ?BMI:  Body mass index is 22.54 kg/m?. ? ?Estimated Nutritional Needs:  ? ?Kcal:  1450-1650 ? ?Protein:  65-80 grams ? ?Fluid:  1.4-1.6 L ? ? ? ?Mertie Clause, MS, RD, LDN ?Inpatient Clinical Dietitian ?Please see AMiON for contact information. ? ?

## 2022-01-04 NOTE — Assessment & Plan Note (Addendum)
CT imaging significant for distended gallbladder with associated intrahepatic/extrahepatic biliary duct dilation, suggesting biliary duct stent malfunction. GI consulted with plan for ERCP. Cholecystectomy as been recommended, however patient/family declines. ERCP performed on 3/16, significant for occluded biliary stent, which was replaced with an uncovered metal stent. GI cleared patient for discharge post-ERCP. ?

## 2022-01-04 NOTE — Consult Note (Addendum)
? ? ? Consultation ? ?Referring Provider:    Dr. Lonny Prude ?Primary Care Physician:  Christain Sacramento, MD ?Primary Gastroenterologist:  Dr. Henrene Pastor       ?Reason for Consultation:   Elevated LFTs in the setting of known malignant distal biliary stricture status post stenting ?       ? HPI:   ?Alicia Spencer is a 81 y.o. female with a past medical history as listed below including dementia and known likely malignant biliary stricture with indwelling stent, who presented to the hospital yesterday with abdominal pain and vomiting, found to have elevated LFTs. ?   Yesterday at time of arrival patient's husband discussed that she had abdominal pain, nausea and vomiting and went to her PCP for routine LFT check and was noted to have biliary obstruction with a known history of biliary stents with malfunction in the past.  The symptoms have been going on for the past week but intermittent and she been using Tylenol for occasional pain. ?   Today, the patient seen with her husband by her side.  She is clearly demented and is only oriented to self.  He provides most of the history and tells me he is not sure if she has been having nausea or vomiting and she has not been complaining of any abdominal pain.  The first time he realized things were bad was when they had labs drawn a couple days ago and were told to come  to the hospital.  He again reiterates that he does not feel like she would be able to go through with any surgery. ?   Denies fever or chills ? ?ER course: 01/03/2022 ALT 133, alk phos 168, total bili 5.4--> this morning ALT 118, alk phos 162, total bili 5.1, CBC normal; CTAP without contrast with distended gallbladder, minimal sludge, stable indwelling common bile duct stent with persistent intrahepatic and extrabiliary duct dilation, persistent dilation despite stent placement may reflect underlying stent malfunction, distal colonic diverticulosis, subacute healing fractures in the bilateral ribs and pelvis; right  upper quadrant ultrasound with intra and extra 5 biliary duct dilation with probable 18 mm stone in the common bile duct, gallbladder is distended with layering sludge and probable tiny stones, gallbladder wall thickness is upper normal to mildly increased ? ?GI history: ?10/05/2021 office visit Dr. Henrene Pastor for biliary obstruction: At that time discussed likely malignant distal biliary stricture and initial consultation in June, underwent ERCP March 24, 2021 and in addition to abrupt cut off of the distal bile duct was noted to have ulcerative changes in the duodenum including the ampulla.  Biopsies were negative for neoplasia.  Sphincterotomy was performed and a biliary stent placed.  She subsequently was rehospitalized with abdominal pain and elevated LFTs and underwent repeat ERCP with stent exchange April 29, 2021, biliary brushings negative for dysplasia, endoscopic ultrasound and ERCP were performed May 08, 2021 and showed no pancreatic abnormality and no evidence for ampullary mass, bile duct was thickened, stent exchange performed (10 French/5 cm), biopsies negative for neoplasia, 1 cytologic specimen was nondiagnostic however 2 additional ones were suspicious for malignancy.  Patient has dementia and the husband elected against oncologic treatment or surgery.  Discussed at that time periodic prophylactic stent exchange.  At that time LFTs were rechecked. ? ?Other GI history in chart ? ?Past Medical History:  ?Diagnosis Date  ? Biliary obstruction 03/2021  ? ? ?Past Surgical History:  ?Procedure Laterality Date  ? BILIARY BRUSHING  03/24/2021  ? Procedure:  BILIARY BRUSHING;  Surgeon: Irene Shipper, MD;  Location: [facility] ENDOSCOPY;  Service: Endoscopy;;  ? BILIARY BRUSHING  04/29/2021  ? Procedure: BILIARY BRUSHING;  Surgeon: Irene Shipper, MD;  Location: [facility] ENDOSCOPY;  Service: Endoscopy;;  ? BILIARY BRUSHING  05/08/2021  ? Procedure: BILIARY BRUSHING;  Surgeon: Rush Landmark Telford Nab., MD;  Location: [facility] ENDOSCOPY;   Service: Gastroenterology;;  ? BILIARY DILATION  05/08/2021  ? Procedure: BILIARY DILATION;  Surgeon: Rush Landmark Telford Nab., MD;  Location: [facility] ENDOSCOPY;  Service: Gastroenterology;;  ? BILIARY STENT PLACEMENT  03/24/2021  ? Procedure: BILIARY STENT PLACEMENT;  Surgeon: Irene Shipper, MD;  Location: [facility] ENDOSCOPY;  Service: Endoscopy;;  ? BILIARY STENT PLACEMENT N/A 04/29/2021  ? Procedure: BILIARY STENT PLACEMENT;  Surgeon: Irene Shipper, MD;  Location: [facility] ENDOSCOPY;  Service: Endoscopy;  Laterality: N/A;  ? BILIARY STENT PLACEMENT N/A 05/08/2021  ? Procedure: BILIARY STENT PLACEMENT;  Surgeon: Rush Landmark Telford Nab., MD;  Location: [facility] ENDOSCOPY;  Service: Gastroenterology;  Laterality: N/A;  ? BIOPSY  03/24/2021  ? Procedure: BIOPSY;  Surgeon: Irene Shipper, MD;  Location: [facility] ENDOSCOPY;  Service: Endoscopy;;  ? BIOPSY  05/08/2021  ? Procedure: BIOPSY;  Surgeon: Irving Copas., MD;  Location: [facility] ENDOSCOPY;  Service: Gastroenterology;;  ? ENDOSCOPIC RETROGRADE CHOLANGIOPANCREATOGRAPHY (ERCP) WITH PROPOFOL N/A 03/24/2021  ? Procedure: ENDOSCOPIC RETROGRADE CHOLANGIOPANCREATOGRAPHY (ERCP) WITH PROPOFOL;  Surgeon: Irene Shipper, MD;  Location: [facility] ENDOSCOPY;  Service: Endoscopy;  Laterality: N/A;  ? ENDOSCOPIC RETROGRADE CHOLANGIOPANCREATOGRAPHY (ERCP) WITH PROPOFOL N/A 05/08/2021  ? Procedure: ENDOSCOPIC RETROGRADE CHOLANGIOPANCREATOGRAPHY (ERCP) WITH PROPOFOL;  Surgeon: Rush Landmark Telford Nab., MD;  Location: [facility] ENDOSCOPY;  Service: Gastroenterology;  Laterality: N/A;  ? ERCP N/A 04/29/2021  ? Procedure: ENDOSCOPIC RETROGRADE CHOLANGIOPANCREATOGRAPHY (ERCP);  Surgeon: Irene Shipper, MD;  Location: [facility] ENDOSCOPY;  Service: Endoscopy;  Laterality: N/A;  ? EUS N/A 05/08/2021  ? Procedure: UPPER ENDOSCOPIC ULTRASOUND (EUS) RADIAL;  Surgeon: Rush Landmark Telford Nab., MD;  Location: [facility] ENDOSCOPY;  Service: Gastroenterology;  Laterality: N/A;  ? FINE NEEDLE ASPIRATION  05/08/2021  ? Procedure: FINE NEEDLE ASPIRATION (FNA) RADIAL;   Surgeon: Irving Copas., MD;  Location: [facility] ENDOSCOPY;  Service: Gastroenterology;;  ? REMOVAL OF STONES  05/08/2021  ? Procedure: REMOVAL OF STONES;  Surgeon: Rush Landmark Telford Nab., MD;  Location: [facility] ENDOSCOPY;  Service: Gastroenterology;;  ? SPHINCTEROTOMY  03/24/2021  ? Procedure: SPHINCTEROTOMY;  Surgeon: Irene Shipper, MD;  Location: [facility] ENDOSCOPY;  Service: Endoscopy;;  ? SPYGLASS CHOLANGIOSCOPY N/A 05/08/2021  ? Procedure: SPYGLASS CHOLANGIOSCOPY;  Surgeon: Irving Copas., MD;  Location: [facility] ENDOSCOPY;  Service: Gastroenterology;  Laterality: N/A;  ? STENT REMOVAL  04/29/2021  ? Procedure: STENT REMOVAL;  Surgeon: Irene Shipper, MD;  Location: [facility] ENDOSCOPY;  Service: Endoscopy;;  ? STENT REMOVAL  05/08/2021  ? Procedure: STENT REMOVAL;  Surgeon: Irving Copas., MD;  Location: [facility] ENDOSCOPY;  Service: Gastroenterology;;  ? ? ?Family History  ?Problem Relation Age of Onset  ? Cancer Mother   ?     kidney?  ? Cancer - Other Father   ? Colon cancer Neg Hx   ? Esophageal cancer Neg Hx   ? Stomach cancer Neg Hx   ? Pancreatic cancer Neg Hx   ? ? ?Social History  ? ?Tobacco Use  ? Smoking status: Every Day  ?  Packs/day: 0.00  ?  Years: 15.00  ?  Pack years: 0.00  ?  Types: Cigarettes  ? Smokeless tobacco: Never  ? Tobacco comments:  ?  only smokes 2 cigarettes/day  ?Vaping Use  ? Vaping Use: Never used  ? ? ?Prior to Admission medications   ?Medication Sig Start Date End Date Taking? Authorizing Provider  ?acetaminophen (TYLENOL) 325 MG tablet Take 650 mg by mouth every 6 (six) hours as needed for moderate pain.   Yes [provider]  ?cholecalciferol (VITAMIN D3) 25 MCG (1000 UNIT) tablet Take 1,000 Units by mouth daily.   Yes [provider]  ?vitamin B-12 (CYANOCOBALAMIN) 1000 MCG tablet Take 1,000 mcg by mouth daily.   Yes [provider]  ? ? ?Current Facility-Administered Medications  ?Medication Dose Route Frequency Provider Last Rate Last Admin  ? fentaNYL  (SUBLIMAZE) injection 12.5-50 mcg  12.5-50 mcg Intravenous Q2H PRN Doutova, Anastassia, MD      ? fentaNYL (SUBLIMAZE) injection 50 mcg  50 mcg Intravenous Once Toy Baker, MD      ? HYDROcodone-acetaminoph

## 2022-01-04 NOTE — Progress Notes (Signed)
?  Transition of Care (TOC) Screening Note ? ? ?Patient Details  ?Name: Alicia Spencer ?Date of Birth: [birth year] ? ? ?Transition of Care [facility]) CM/SW Contact:    ?Lanier Clam, RN ?Phone Number: ?01/04/2022, 10:13 AM ? ? ? ?Transition of Care Department [facility]) has reviewed patient and no TOC needs have been identified at this time. We will continue to monitor patient advancement through interdisciplinary progression rounds. If new patient transition needs arise, please place a TOC consult. ?  ?

## 2022-01-05 ENCOUNTER — Encounter (HOSPITAL_COMMUNITY): Payer: Self-pay | Admitting: Internal Medicine

## 2022-01-08 ENCOUNTER — Other Ambulatory Visit (INDEPENDENT_AMBULATORY_CARE_PROVIDER_SITE_OTHER): Payer: Medicare Other

## 2022-01-08 ENCOUNTER — Other Ambulatory Visit: Payer: Self-pay

## 2022-01-08 DIAGNOSIS — R7989 Other specified abnormal findings of blood chemistry: Secondary | ICD-10-CM

## 2022-01-08 LAB — HEPATIC FUNCTION PANEL
ALT: 47 U/L — ABNORMAL HIGH (ref 0–35)
AST: 22 U/L (ref 0–37)
Albumin: 3.8 g/dL (ref 3.5–5.2)
Alkaline Phosphatase: 169 U/L — ABNORMAL HIGH (ref 39–117)
Bilirubin, Direct: 0.7 mg/dL — ABNORMAL HIGH (ref 0.0–0.3)
Total Bilirubin: 1.6 mg/dL — ABNORMAL HIGH (ref 0.2–1.2)
Total Protein: 6.5 g/dL (ref 6.0–8.3)

## 2022-01-08 LAB — CULTURE, BLOOD (ROUTINE X 2)
Culture: NO GROWTH
Culture: NO GROWTH
Special Requests: ADEQUATE

## 2022-01-22 ENCOUNTER — Other Ambulatory Visit: Payer: Self-pay

## 2022-01-22 ENCOUNTER — Other Ambulatory Visit (INDEPENDENT_AMBULATORY_CARE_PROVIDER_SITE_OTHER): Payer: Medicare Other

## 2022-01-22 DIAGNOSIS — R7989 Other specified abnormal findings of blood chemistry: Secondary | ICD-10-CM

## 2022-01-22 LAB — HEPATIC FUNCTION PANEL
ALT: 19 U/L (ref 0–35)
AST: 18 U/L (ref 0–37)
Albumin: 4.3 g/dL (ref 3.5–5.2)
Alkaline Phosphatase: 129 U/L — ABNORMAL HIGH (ref 39–117)
Bilirubin, Direct: 0.3 mg/dL (ref 0.0–0.3)
Total Bilirubin: 1.1 mg/dL (ref 0.2–1.2)
Total Protein: 7.3 g/dL (ref 6.0–8.3)

## 2022-02-13 ENCOUNTER — Other Ambulatory Visit: Payer: Medicare Other

## 2022-02-13 DIAGNOSIS — R7989 Other specified abnormal findings of blood chemistry: Secondary | ICD-10-CM

## 2022-02-13 LAB — HEPATIC FUNCTION PANEL
ALT: 12 U/L (ref 0–35)
AST: 15 U/L (ref 0–37)
Albumin: 4.4 g/dL (ref 3.5–5.2)
Alkaline Phosphatase: 112 U/L (ref 39–117)
Bilirubin, Direct: 0.2 mg/dL (ref 0.0–0.3)
Total Bilirubin: 1.1 mg/dL (ref 0.2–1.2)
Total Protein: 7.4 g/dL (ref 6.0–8.3)

## 2022-02-14 ENCOUNTER — Other Ambulatory Visit: Payer: Self-pay

## 2022-02-14 DIAGNOSIS — R7989 Other specified abnormal findings of blood chemistry: Secondary | ICD-10-CM

## 2022-03-12 IMAGING — CT CT ABD-PELV W/ CM
2 of 5 series · 16 of 46 positions shown, 18 images · IV contrast (omnipaque)
Comparison: CT 03/22/2021.  MRI 03/23/2021

CLINICAL DATA: Epigastric pain, elevated liver function studies,
and bilirubin. Recent biliary stent. Pain for 2 days.

EXAM:
CT ABDOMEN AND PELVIS WITH CONTRAST
TECHNIQUE: Multidetector CT imaging of the abdomen and pelvis was performed
using the standard protocol following bolus administration of
intravenous contrast.
CONTRAST:  80mL OMNIPAQUE IOHEXOL 350 MG/ML SOLN

[Series 2: axial st · axial · 0.79mm/px · z∈[-662,-312]mm · 13 of 80 slices shown, 15 images]
[im 5/80  soft-tissue]
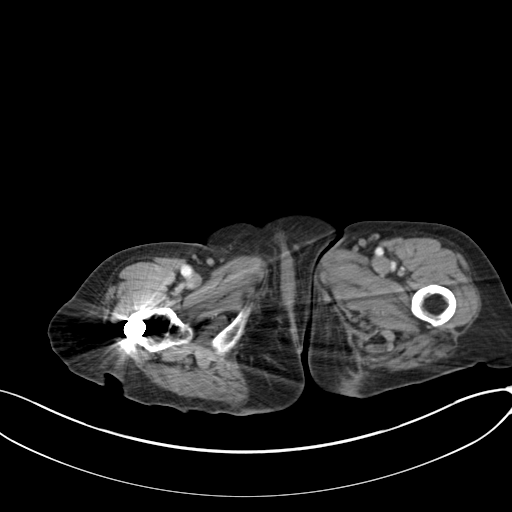
[im 5/80  bone]
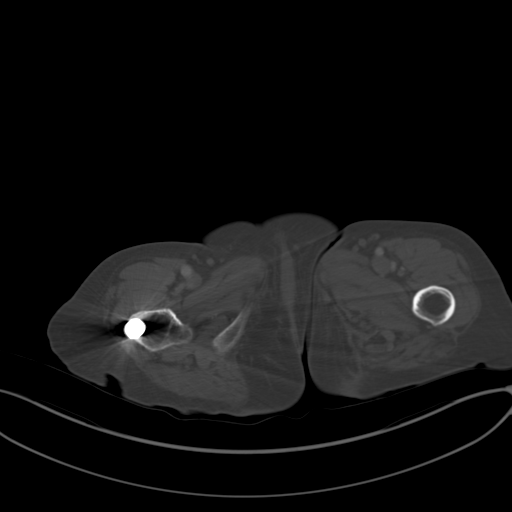
[im 10/80  soft-tissue]
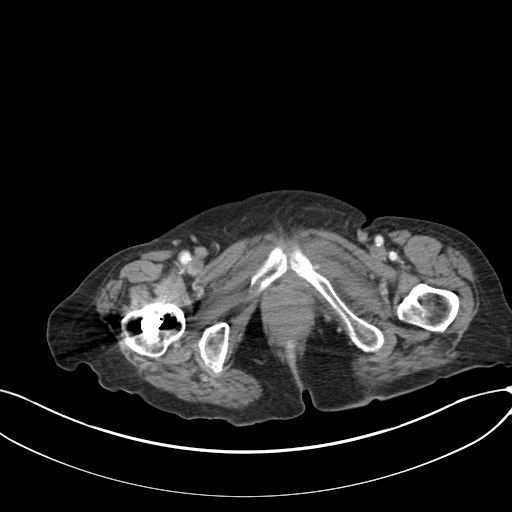
[im 15/80  soft-tissue]
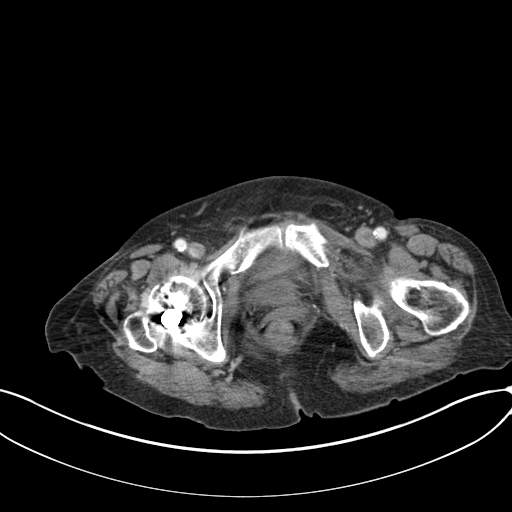
[im 25/80  soft-tissue]
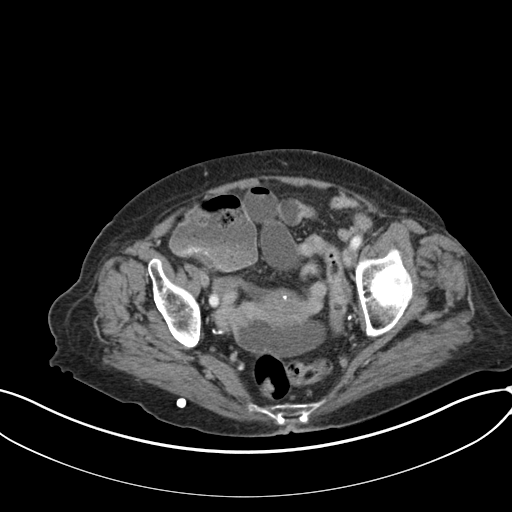
[im 30/80  soft-tissue]
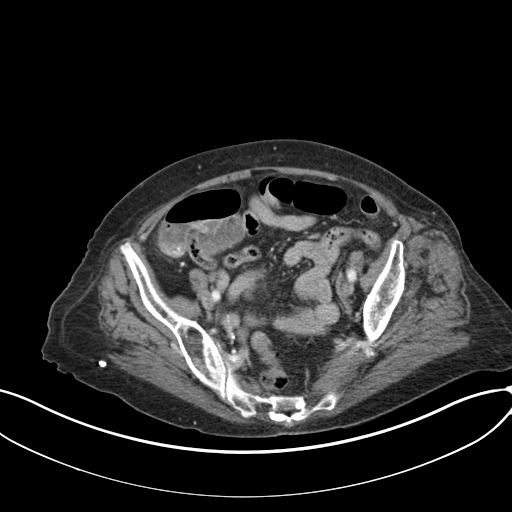
[im 35/80  soft-tissue]
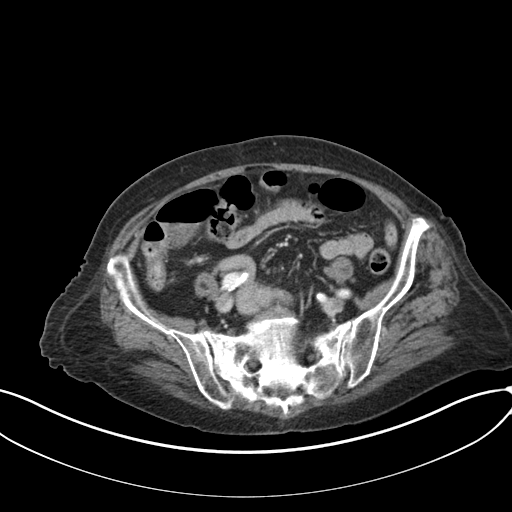
[im 40/80  soft-tissue]
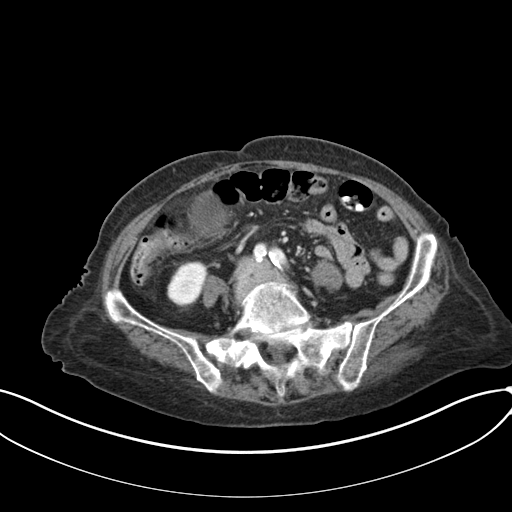
[im 45/80  soft-tissue]
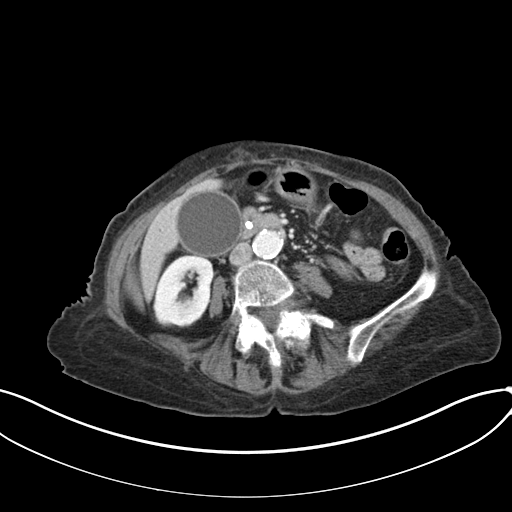
[im 50/80  soft-tissue]
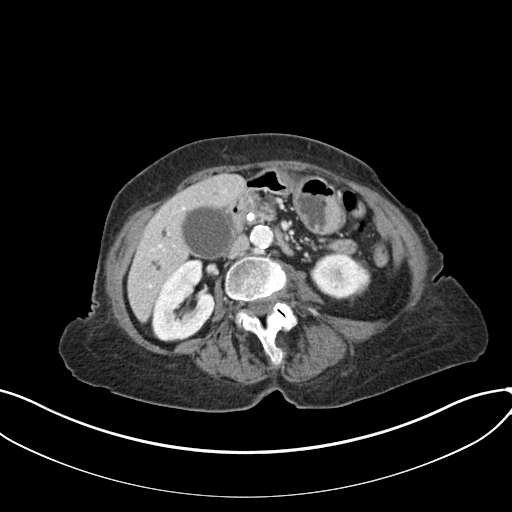
[im 50/80  bone]
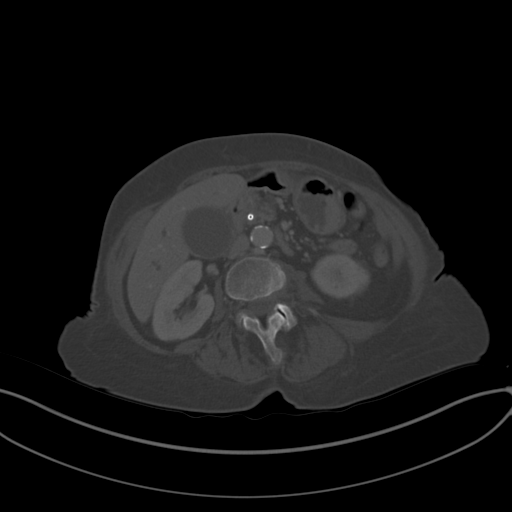
[im 55/80  soft-tissue]
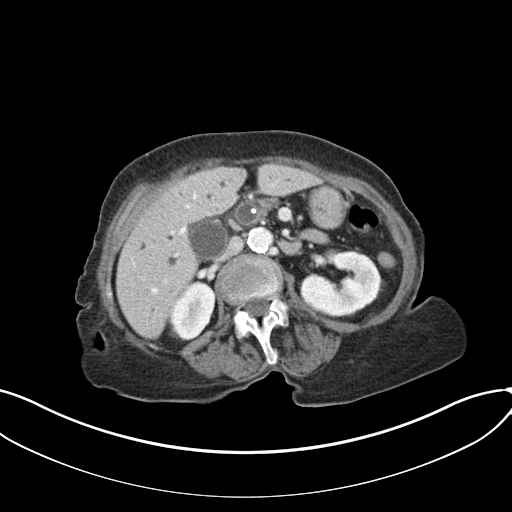
[im 65/80  soft-tissue]
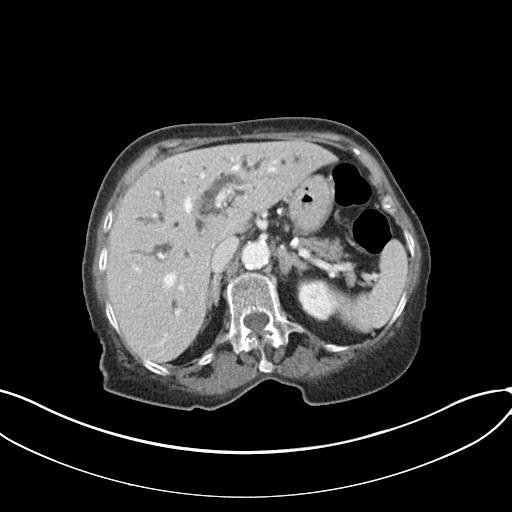
[im 70/80  soft-tissue]
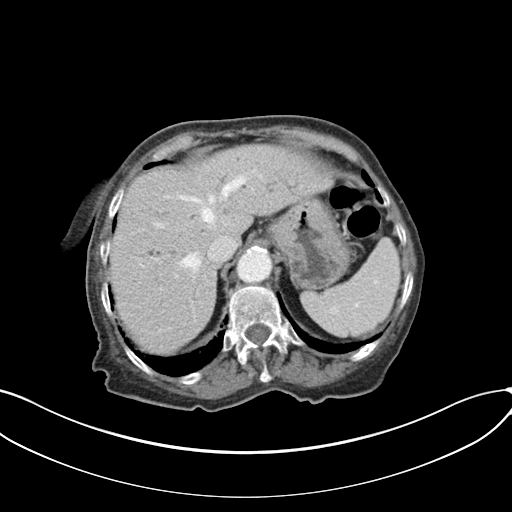
[im 75/80  soft-tissue]
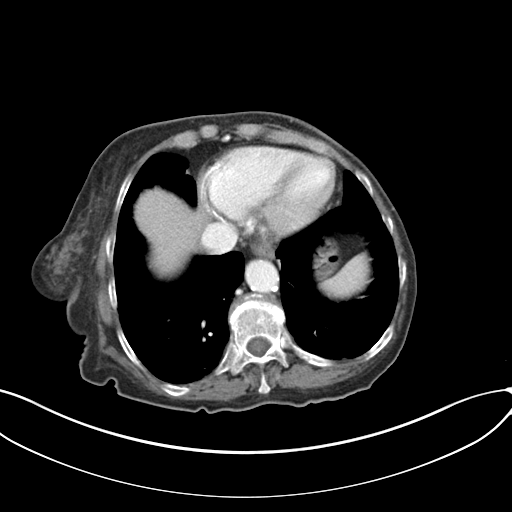

[Series 4: coronal st · coronal · 0.76mm/px · 3 of 131 slices shown]
[im 44/131  soft-tissue]
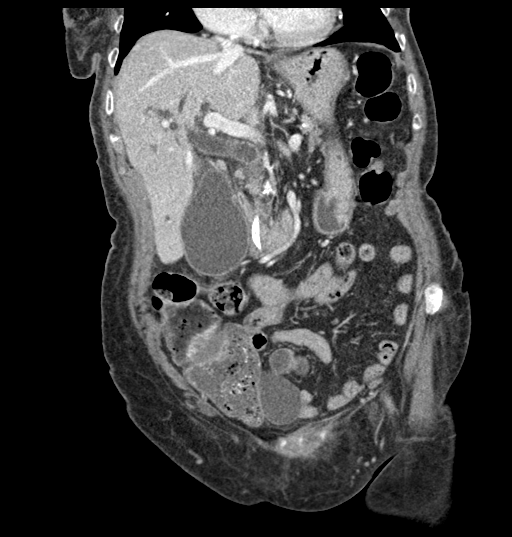
[im 58/131  soft-tissue]
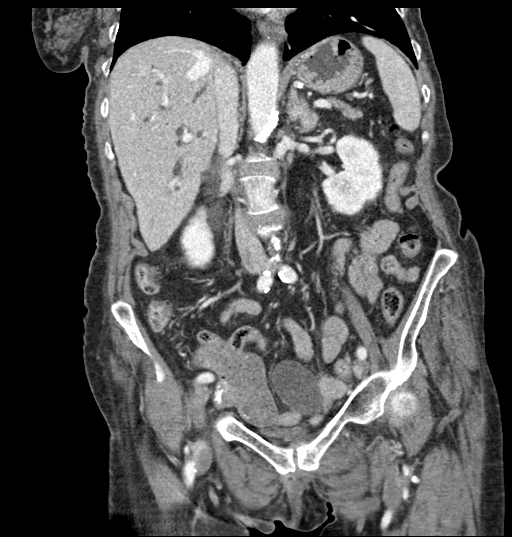
[im 73/131  soft-tissue]
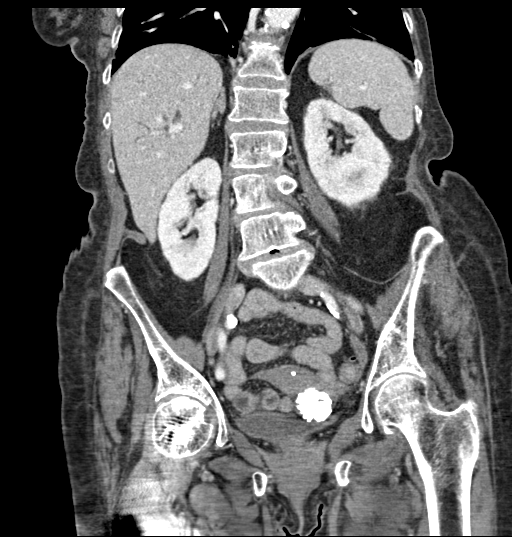

[16 of 46 positions shown; findings below may reference images not displayed]

FINDINGS: Lower chest: Mild dependent atelectasis in the lung bases.

Hepatobiliary: Diffusely distended gallbladder with mild
pericholecystic edema. Prominent intra and extrahepatic bile duct
dilatation. A distal biliary stent is present. Persistence of bile
duct dilatation despite stent placement suggest possible stent
malfunction. No definite mass lesion.

Pancreas: Pancreatic atrophy.  No ductal dilatation or inflammation.

Spleen: Normal in size without focal abnormality.

Adrenals/Urinary Tract: Adrenal glands are unremarkable. Kidneys are
normal, without renal calculi, focal lesion, or hydronephrosis.
Bladder is unremarkable.

Stomach/Bowel: Stomach, small bowel, and colon are not abnormally
distended. No wall thickening or inflammatory changes. Appendix is
not identified.

Vascular/Lymphatic: Aortic atherosclerosis. No enlarged abdominal or
pelvic lymph nodes.

Reproductive: Uterine calcification consistent with fibroid. No
abnormal adnexal masses.

Other: No free air or free fluid in the abdomen. Abdominal wall
musculature appears intact.

Musculoskeletal: Spondylolysis with mild spondylolisthesis at L4-5.
Mild degenerative changes in the spine. Postoperative fixation of
the right hip with degenerative change in the right hip.
IMPRESSION: 1. Persistent intra and extrahepatic bile duct dilatation and
gallbladder distension with a biliary stent present. Persistence of
bile duct dilatation suggest possible stent malfunction. No mass or
stone identified.
2. Pancreatic atrophy. No inflammatory changes or ductal dilatation
demonstrated. No focal mass lesions identified.
3. Aortic atherosclerosis.
4. Calcified uterine fibroids.

## 2022-03-20 ENCOUNTER — Other Ambulatory Visit: Payer: Self-pay

## 2022-03-20 ENCOUNTER — Other Ambulatory Visit (INDEPENDENT_AMBULATORY_CARE_PROVIDER_SITE_OTHER): Payer: Medicare Other

## 2022-03-20 DIAGNOSIS — R7989 Other specified abnormal findings of blood chemistry: Secondary | ICD-10-CM | POA: Diagnosis not present

## 2022-03-20 LAB — HEPATIC FUNCTION PANEL
ALT: 64 U/L — ABNORMAL HIGH (ref 0–35)
AST: 20 U/L (ref 0–37)
Albumin: 4.2 g/dL (ref 3.5–5.2)
Alkaline Phosphatase: 135 U/L — ABNORMAL HIGH (ref 39–117)
Bilirubin, Direct: 0.2 mg/dL (ref 0.0–0.3)
Total Bilirubin: 1 mg/dL (ref 0.2–1.2)
Total Protein: 6.8 g/dL (ref 6.0–8.3)

## 2022-04-17 ENCOUNTER — Other Ambulatory Visit (INDEPENDENT_AMBULATORY_CARE_PROVIDER_SITE_OTHER): Payer: Medicare Other

## 2022-04-17 DIAGNOSIS — R7989 Other specified abnormal findings of blood chemistry: Secondary | ICD-10-CM

## 2022-04-17 LAB — HEPATIC FUNCTION PANEL
ALT: 21 U/L (ref 0–35)
AST: 18 U/L (ref 0–37)
Albumin: 4.4 g/dL (ref 3.5–5.2)
Alkaline Phosphatase: 105 U/L (ref 39–117)
Bilirubin, Direct: 0.2 mg/dL (ref 0.0–0.3)
Total Bilirubin: 0.8 mg/dL (ref 0.2–1.2)
Total Protein: 7.2 g/dL (ref 6.0–8.3)

## 2022-04-18 ENCOUNTER — Other Ambulatory Visit: Payer: Self-pay

## 2022-04-18 DIAGNOSIS — R7989 Other specified abnormal findings of blood chemistry: Secondary | ICD-10-CM

## 2022-05-18 ENCOUNTER — Other Ambulatory Visit: Payer: Self-pay

## 2022-05-18 ENCOUNTER — Other Ambulatory Visit (INDEPENDENT_AMBULATORY_CARE_PROVIDER_SITE_OTHER): Payer: Medicare Other

## 2022-05-18 DIAGNOSIS — R7989 Other specified abnormal findings of blood chemistry: Secondary | ICD-10-CM

## 2022-05-18 LAB — HEPATIC FUNCTION PANEL
ALT: 19 U/L (ref 0–35)
AST: 18 U/L (ref 0–37)
Albumin: 4.4 g/dL (ref 3.5–5.2)
Alkaline Phosphatase: 110 U/L (ref 39–117)
Bilirubin, Direct: 0.1 mg/dL (ref 0.0–0.3)
Total Bilirubin: 0.8 mg/dL (ref 0.2–1.2)
Total Protein: 7.4 g/dL (ref 6.0–8.3)

## 2022-06-18 ENCOUNTER — Other Ambulatory Visit (INDEPENDENT_AMBULATORY_CARE_PROVIDER_SITE_OTHER): Payer: Medicare Other

## 2022-06-18 ENCOUNTER — Other Ambulatory Visit: Payer: Self-pay

## 2022-06-18 DIAGNOSIS — R7989 Other specified abnormal findings of blood chemistry: Secondary | ICD-10-CM

## 2022-06-18 LAB — HEPATIC FUNCTION PANEL
ALT: 51 U/L — ABNORMAL HIGH (ref 0–35)
AST: 21 U/L (ref 0–37)
Albumin: 4.2 g/dL (ref 3.5–5.2)
Alkaline Phosphatase: 131 U/L — ABNORMAL HIGH (ref 39–117)
Bilirubin, Direct: 0.1 mg/dL (ref 0.0–0.3)
Total Bilirubin: 0.6 mg/dL (ref 0.2–1.2)
Total Protein: 7.2 g/dL (ref 6.0–8.3)

## 2022-07-18 ENCOUNTER — Other Ambulatory Visit: Payer: Self-pay

## 2022-07-18 ENCOUNTER — Other Ambulatory Visit (INDEPENDENT_AMBULATORY_CARE_PROVIDER_SITE_OTHER): Payer: Medicare Other

## 2022-07-18 DIAGNOSIS — R7989 Other specified abnormal findings of blood chemistry: Secondary | ICD-10-CM

## 2022-07-18 LAB — HEPATIC FUNCTION PANEL
ALT: 16 U/L (ref 0–35)
AST: 13 U/L (ref 0–37)
Albumin: 4.2 g/dL (ref 3.5–5.2)
Alkaline Phosphatase: 178 U/L — ABNORMAL HIGH (ref 39–117)
Bilirubin, Direct: 0.1 mg/dL (ref 0.0–0.3)
Total Bilirubin: 0.5 mg/dL (ref 0.2–1.2)
Total Protein: 7.5 g/dL (ref 6.0–8.3)

## 2022-07-23 ENCOUNTER — Other Ambulatory Visit: Payer: Self-pay

## 2022-07-23 DIAGNOSIS — R7989 Other specified abnormal findings of blood chemistry: Secondary | ICD-10-CM

## 2022-08-15 ENCOUNTER — Other Ambulatory Visit (INDEPENDENT_AMBULATORY_CARE_PROVIDER_SITE_OTHER): Payer: Medicare Other

## 2022-08-15 DIAGNOSIS — R7989 Other specified abnormal findings of blood chemistry: Secondary | ICD-10-CM | POA: Diagnosis not present

## 2022-08-15 LAB — HEPATIC FUNCTION PANEL
ALT: 20 U/L (ref 0–35)
AST: 16 U/L (ref 0–37)
Albumin: 4.3 g/dL (ref 3.5–5.2)
Alkaline Phosphatase: 151 U/L — ABNORMAL HIGH (ref 39–117)
Bilirubin, Direct: 0.1 mg/dL (ref 0.0–0.3)
Total Bilirubin: 0.6 mg/dL (ref 0.2–1.2)
Total Protein: 7.3 g/dL (ref 6.0–8.3)

## 2022-08-16 ENCOUNTER — Other Ambulatory Visit: Payer: Self-pay

## 2022-08-16 DIAGNOSIS — R7989 Other specified abnormal findings of blood chemistry: Secondary | ICD-10-CM

## 2022-09-17 ENCOUNTER — Other Ambulatory Visit (INDEPENDENT_AMBULATORY_CARE_PROVIDER_SITE_OTHER): Payer: Medicare Other

## 2022-09-17 DIAGNOSIS — R7989 Other specified abnormal findings of blood chemistry: Secondary | ICD-10-CM

## 2022-09-17 LAB — HEPATIC FUNCTION PANEL
ALT: 45 U/L — ABNORMAL HIGH (ref 0–35)
AST: 28 U/L (ref 0–37)
Albumin: 4.2 g/dL (ref 3.5–5.2)
Alkaline Phosphatase: 234 U/L — ABNORMAL HIGH (ref 39–117)
Bilirubin, Direct: 0.2 mg/dL (ref 0.0–0.3)
Total Bilirubin: 0.9 mg/dL (ref 0.2–1.2)
Total Protein: 7.3 g/dL (ref 6.0–8.3)

## 2022-09-19 ENCOUNTER — Other Ambulatory Visit: Payer: Self-pay

## 2022-09-19 DIAGNOSIS — R7989 Other specified abnormal findings of blood chemistry: Secondary | ICD-10-CM

## 2022-10-17 ENCOUNTER — Other Ambulatory Visit: Payer: Self-pay

## 2022-10-17 ENCOUNTER — Other Ambulatory Visit (INDEPENDENT_AMBULATORY_CARE_PROVIDER_SITE_OTHER): Payer: Medicare Other

## 2022-10-17 DIAGNOSIS — R7989 Other specified abnormal findings of blood chemistry: Secondary | ICD-10-CM

## 2022-10-17 LAB — HEPATIC FUNCTION PANEL
ALT: 10 U/L (ref 0–35)
AST: 13 U/L (ref 0–37)
Albumin: 4.3 g/dL (ref 3.5–5.2)
Alkaline Phosphatase: 123 U/L — ABNORMAL HIGH (ref 39–117)
Bilirubin, Direct: 0.2 mg/dL (ref 0.0–0.3)
Total Bilirubin: 0.7 mg/dL (ref 0.2–1.2)
Total Protein: 7.6 g/dL (ref 6.0–8.3)

## 2022-11-16 ENCOUNTER — Other Ambulatory Visit: Payer: Self-pay

## 2022-11-16 ENCOUNTER — Other Ambulatory Visit (INDEPENDENT_AMBULATORY_CARE_PROVIDER_SITE_OTHER): Payer: Medicare Other

## 2022-11-16 DIAGNOSIS — R7989 Other specified abnormal findings of blood chemistry: Secondary | ICD-10-CM | POA: Diagnosis not present

## 2022-11-16 LAB — HEPATIC FUNCTION PANEL
ALT: 12 U/L (ref 0–35)
AST: 13 U/L (ref 0–37)
Albumin: 4.1 g/dL (ref 3.5–5.2)
Alkaline Phosphatase: 114 U/L (ref 39–117)
Bilirubin, Direct: 0.1 mg/dL (ref 0.0–0.3)
Total Bilirubin: 0.6 mg/dL (ref 0.2–1.2)
Total Protein: 7.1 g/dL (ref 6.0–8.3)

## 2022-12-17 ENCOUNTER — Other Ambulatory Visit (INDEPENDENT_AMBULATORY_CARE_PROVIDER_SITE_OTHER): Payer: Medicare Other

## 2022-12-17 DIAGNOSIS — R7989 Other specified abnormal findings of blood chemistry: Secondary | ICD-10-CM

## 2022-12-17 LAB — HEPATIC FUNCTION PANEL
ALT: 20 U/L (ref 0–35)
AST: 14 U/L (ref 0–37)
Albumin: 3.9 g/dL (ref 3.5–5.2)
Alkaline Phosphatase: 140 U/L — ABNORMAL HIGH (ref 39–117)
Bilirubin, Direct: 0.2 mg/dL (ref 0.0–0.3)
Total Bilirubin: 0.7 mg/dL (ref 0.2–1.2)
Total Protein: 7.5 g/dL (ref 6.0–8.3)

## 2023-01-15 ENCOUNTER — Other Ambulatory Visit: Payer: Self-pay

## 2023-01-15 ENCOUNTER — Other Ambulatory Visit (INDEPENDENT_AMBULATORY_CARE_PROVIDER_SITE_OTHER): Payer: Medicare Other

## 2023-01-15 DIAGNOSIS — R7989 Other specified abnormal findings of blood chemistry: Secondary | ICD-10-CM

## 2023-01-15 LAB — HEPATIC FUNCTION PANEL
ALT: 22 U/L (ref 0–35)
AST: 18 U/L (ref 0–37)
Albumin: 4.2 g/dL (ref 3.5–5.2)
Alkaline Phosphatase: 164 U/L — ABNORMAL HIGH (ref 39–117)
Bilirubin, Direct: 0.1 mg/dL (ref 0.0–0.3)
Total Bilirubin: 0.8 mg/dL (ref 0.2–1.2)
Total Protein: 8 g/dL (ref 6.0–8.3)

## 2023-02-14 ENCOUNTER — Other Ambulatory Visit: Payer: Self-pay

## 2023-02-14 ENCOUNTER — Other Ambulatory Visit (INDEPENDENT_AMBULATORY_CARE_PROVIDER_SITE_OTHER): Payer: Medicare Other

## 2023-02-14 DIAGNOSIS — R7989 Other specified abnormal findings of blood chemistry: Secondary | ICD-10-CM

## 2023-02-14 LAB — HEPATIC FUNCTION PANEL
ALT: 102 U/L — ABNORMAL HIGH (ref 0–35)
AST: 33 U/L (ref 0–37)
Albumin: 4.2 g/dL (ref 3.5–5.2)
Alkaline Phosphatase: 232 U/L — ABNORMAL HIGH (ref 39–117)
Bilirubin, Direct: 0.3 mg/dL (ref 0.0–0.3)
Total Bilirubin: 1 mg/dL (ref 0.2–1.2)
Total Protein: 7.5 g/dL (ref 6.0–8.3)

## 2023-03-19 ENCOUNTER — Other Ambulatory Visit (INDEPENDENT_AMBULATORY_CARE_PROVIDER_SITE_OTHER): Payer: Medicare Other

## 2023-03-19 DIAGNOSIS — R7989 Other specified abnormal findings of blood chemistry: Secondary | ICD-10-CM | POA: Diagnosis not present

## 2023-03-19 LAB — HEPATIC FUNCTION PANEL
ALT: 46 U/L — ABNORMAL HIGH (ref 0–35)
AST: 36 U/L (ref 0–37)
Albumin: 4.3 g/dL (ref 3.5–5.2)
Alkaline Phosphatase: 223 U/L — ABNORMAL HIGH (ref 39–117)
Bilirubin, Direct: 0.2 mg/dL (ref 0.0–0.3)
Total Bilirubin: 0.9 mg/dL (ref 0.2–1.2)
Total Protein: 7.6 g/dL (ref 6.0–8.3)

## 2023-03-25 ENCOUNTER — Other Ambulatory Visit: Payer: Self-pay

## 2023-03-25 DIAGNOSIS — R7989 Other specified abnormal findings of blood chemistry: Secondary | ICD-10-CM

## 2023-04-12 ENCOUNTER — Other Ambulatory Visit (INDEPENDENT_AMBULATORY_CARE_PROVIDER_SITE_OTHER): Payer: Medicare Other

## 2023-04-12 ENCOUNTER — Other Ambulatory Visit: Payer: Self-pay

## 2023-04-12 DIAGNOSIS — R7989 Other specified abnormal findings of blood chemistry: Secondary | ICD-10-CM | POA: Diagnosis not present

## 2023-04-12 LAB — HEPATIC FUNCTION PANEL
ALT: 66 U/L — ABNORMAL HIGH (ref 0–35)
AST: 32 U/L (ref 0–37)
Albumin: 3.9 g/dL (ref 3.5–5.2)
Alkaline Phosphatase: 246 U/L — ABNORMAL HIGH (ref 39–117)
Bilirubin, Direct: 0.4 mg/dL — ABNORMAL HIGH (ref 0.0–0.3)
Total Bilirubin: 1.2 mg/dL (ref 0.2–1.2)
Total Protein: 7.1 g/dL (ref 6.0–8.3)

## 2023-04-16 ENCOUNTER — Other Ambulatory Visit: Payer: Self-pay

## 2023-04-16 ENCOUNTER — Emergency Department (HOSPITAL_COMMUNITY)
Admission: EM | Admit: 2023-04-16 | Discharge: 2023-04-16 | Disposition: A | Discharge status: Home / Self Care | Payer: Medicare Other | Attending: Emergency Medicine | Admitting: Emergency Medicine

## 2023-04-16 ENCOUNTER — Encounter (HOSPITAL_COMMUNITY): Payer: Self-pay

## 2023-04-16 DIAGNOSIS — G309 Alzheimer's disease, unspecified: Secondary | ICD-10-CM | POA: Insufficient documentation

## 2023-04-16 DIAGNOSIS — F0280 Dementia in other diseases classified elsewhere without behavioral disturbance: Secondary | ICD-10-CM | POA: Insufficient documentation

## 2023-04-16 NOTE — Discharge Instructions (Addendum)
Return to the ED with any new or worsening signs or symptoms Please have the patient follow-up with PCP today or sometime this week for possible placement on medication Please read the attached handout concerning sundowning and dementia Please begin giving the patient her donezepil as prescribed

## 2023-04-16 NOTE — ED Triage Notes (Signed)
Patient's husband reports patient has been up all night trying to pack and saying she doesn't live where she does. Is trying to move back to an old home. Has known diagnosis of dementia. Husband states she has been doing this for a while but tonight it was worse and he hasn't been able to get any sleep and didn't really know what to do with her. Patient alert and oriented to person and place, baseline per husband.
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
## 2023-04-19 ENCOUNTER — Encounter (HOSPITAL_COMMUNITY): Payer: Self-pay

## 2023-04-19 ENCOUNTER — Other Ambulatory Visit: Payer: Self-pay

## 2023-04-19 ENCOUNTER — Emergency Department (HOSPITAL_COMMUNITY)
Admission: EM | Admit: 2023-04-19 | Discharge: 2023-04-19 | Disposition: A | Discharge status: Home / Self Care | Payer: Medicare Other | Attending: Emergency Medicine | Admitting: Emergency Medicine

## 2023-04-19 DIAGNOSIS — G309 Alzheimer's disease, unspecified: Secondary | ICD-10-CM | POA: Insufficient documentation

## 2023-04-19 DIAGNOSIS — F02B18 Dementia in other diseases classified elsewhere, moderate, with other behavioral disturbance: Secondary | ICD-10-CM | POA: Insufficient documentation

## 2023-04-19 LAB — CBC WITH DIFFERENTIAL/PLATELET
Abs Immature Granulocytes: 0.01 10*3/uL (ref 0.00–0.07)
Basophils Absolute: 0 10*3/uL (ref 0.0–0.1)
Basophils Relative: 1 %
Eosinophils Absolute: 0.1 10*3/uL (ref 0.0–0.5)
Eosinophils Relative: 2 %
HCT: 40.2 % (ref 36.0–46.0)
Hemoglobin: 13.5 g/dL (ref 12.0–15.0)
Immature Granulocytes: 0 %
Lymphocytes Relative: 18 %
Lymphs Abs: 0.8 10*3/uL (ref 0.7–4.0)
MCH: 30.8 pg (ref 26.0–34.0)
MCHC: 33.6 g/dL (ref 30.0–36.0)
MCV: 91.6 fL (ref 80.0–100.0)
Monocytes Absolute: 0.3 10*3/uL (ref 0.1–1.0)
Monocytes Relative: 7 %
Neutro Abs: 3.4 10*3/uL (ref 1.7–7.7)
Neutrophils Relative %: 72 %
Platelets: 334 10*3/uL (ref 150–400)
RBC: 4.39 MIL/uL (ref 3.87–5.11)
RDW: 14.3 % (ref 11.5–15.5)
WBC: 4.7 10*3/uL (ref 4.0–10.5)
nRBC: 0 % (ref 0.0–0.2)

## 2023-04-19 LAB — COMPREHENSIVE METABOLIC PANEL
ALT: 43 U/L (ref 0–44)
AST: 31 U/L (ref 15–41)
Albumin: 3.9 g/dL (ref 3.5–5.0)
Alkaline Phosphatase: 213 U/L — ABNORMAL HIGH (ref 38–126)
Anion gap: 10 (ref 5–15)
BUN: 12 mg/dL (ref 8–23)
CO2: 22 mmol/L (ref 22–32)
Calcium: 8.7 mg/dL — ABNORMAL LOW (ref 8.9–10.3)
Chloride: 109 mmol/L (ref 98–111)
Creatinine, Ser: 0.62 mg/dL (ref 0.44–1.00)
GFR, Estimated: >60 mL/min (ref >60)
Glucose, Bld: 103 mg/dL — ABNORMAL HIGH (ref 70–99)
Potassium: 2.9 mmol/L — ABNORMAL LOW (ref 3.5–5.1)
Sodium: 141 mmol/L (ref 135–145)
Total Bilirubin: 1 mg/dL (ref 0.3–1.2)
Total Protein: 7.2 g/dL (ref 6.5–8.1)

## 2023-04-19 MED ORDER — POTASSIUM CHLORIDE CRYS ER 20 MEQ PO TBCR
40.0000 meq | EXTENDED_RELEASE_TABLET | Freq: Once | ORAL | Status: AC
  Administered 2023-04-19: 40 meq via ORAL
  Filled 2023-04-19: qty 2
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
## 2023-04-19 NOTE — Progress Notes (Signed)
Transition of Care [facility]) - Emergency Department Mini Assessment   Patient Details  Name: Alicia Spencer MRN: [id number] Date of Birth: [birth year]  Transition of Care [facility]) CM/SW Contact:    Princella Ion, LCSW Phone Number: 04/19/2023, 12:24 PM   Clinical Narrative: This CSW spoke with pt and husband at bedside. Husband reports pt recently has had increased agitation/aggression, has been "talking to dead relatives," and "running off." This CSW informed pt's husband that our team was consulted for SNF; however due to pt being able to mobilize, likely will not be recommended by PT nor approved by insurance. Explained that SNF requires a rehab need. Pt's husband states "they mentioned something about someone coming into the home a couple times a week." This CSW informed pt's husband that she'd speak with the RNCM to inquire about potential HH referrals but did inform of challenges with securing HH due to insurance [employer] Medicare). Pt's husband did state he is not interested in placing the pt into a facility at this time but just wants "something to slow her down. Right now, I have to keep my eyes on her at all times."  This CSW informed pt and husband about [facility] and pt's husband accepted offer for referral. This CSW has outreached to [employer] with [facility] to assist. This CSW will also provide memory care resources to the pt's discharge paperwork. Pt's husband verbalized understanding.   Case discussed with EDP who noted that pt was just seen by her PCP on 04/17/23 and was started on a new medication. Resources provided to AVS. RNCM to assist with securing HH.    ED Mini Assessment: What brought you to the Emergency Department? : Pt with history of dementia an dhas behaviors  Barriers to Discharge: No Barriers Identified     Means of departure: Car       Patient Contact and Communications Key Contact 1: Rogena, Gollehon (Spouse) [phone]   Spoke with: Husband  and pt at bedside Contact Date: 04/19/23,   Contact time: 1155   Call outcome: Pt's husband requesting Home Health services if possible, TOC to coordinate and also provide referral to [facility] and resources.         Admission diagnosis:  Dementia Patient Active Problem List   Diagnosis Date Noted   Dementia (HCC) 01/04/2022   Rib fractures 01/04/2022   Common bile duct (CBD) obstruction    Bacteremia due to Gram-negative bacteria 04/29/2021   Hypokalemia 04/28/2021   Jaundice 04/28/2021   Elevated LFTs 04/27/2021   Bile duct stricture    Biliary obstruction    Duodenal ulcer    Acute cholecystitis 03/22/2021   PCP:  [name], MD Pharmacy:   [facility] [address], [place] - [address] [place] [address] Phone: [phone] Fax: [phone]

## 2023-04-19 NOTE — Discharge Instructions (Addendum)
We have scheduled home health visit for Alicia Spencer. It is critical that you called the primary care doctor today and discussed with them the worsening behavioral issues so that they can add additional medication to slow her down.  FREE  1) Alzheimer's Association 24/7 Helpline (1.925-093-1785) General guidance on legal, financial, and care options LimitLaws.hu  2) Senior Resources at [employer] ([phone]) Case assistance, Meals on Wheels, Armed forces logistics/support/administrative officer www.senior-resources-[place].org  FEE BASED SERVICES  1) [facility] [phone]) Senior Care Navigation Services, Legal/Financial Guidance Www.elderlawfirm.com  2) [employer] 949-060-0912) Senior Care Navigation Services, Legal/Financial Guidance Www.navigateseniorcare.com  Day Care 1) [employer] 2103575722) Half Day and Full Day Porgrams Www.well-springsolutions.com  Personal Caregiving 2) [facility] [phone]) 20 hours/week up to 24/7 or live-in Private Pay or Long Term Care insurance accepted [email or web address]
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
## 2023-04-19 NOTE — ED Notes (Signed)
Visitor left bed alarm placed under patient

## 2023-04-19 NOTE — Care Management (Addendum)
Transition of Care [facility]) - Emergency Department Mini Assessment   Patient Details  Name: Alicia Spencer MRN: [id number] Date of Birth: 26-May-1941  Transition of Care [facility]) CM/SW Contact:    Lavenia Atlas, RN Phone Number: 04/19/2023, 12:33 PM   Clinical Narrative:  Per chart review patient has diagnosis of dementia presents to [facility] ED, recently seen in ED on 04/16/23.  Attempted to speak with patient's husband [name] at bedside however he has stepped away. This RNCM unable to speak with [name].  HHRN (medication management), OT, SW orders are in. Elnita Maxwell with [facility] has accepted patient for [facility] services, attached to AVS.   RNCM spoke with pt's husband [name] regarding discharge planning for [employer]. Offered pt medicare.gov list of home health agencies to choose from.  Pt chose [facility] to render services. Elnita Maxwell of [facility] notified. Patient made aware that [facility] will be in contact in 24-48 hours.  No DME needs identified at this time.    Karoline Caldwell, BSN, RN, CCM, [place] [phone]  ED Mini Assessment: What brought you to the Emergency Department? : Pt with history of dementia an dhas behaviors  Barriers to Discharge: No Barriers Identified     Means of departure: Car       Patient Contact and Communications Key Contact 1: Orielle, Cockey (Spouse) [phone]   Spoke with: Husband and pt at bedside Contact Date: 04/19/23,   Contact time: 1155   Call outcome: Pt's husband requesting Home Health services if possible, TOC to coordinate and also provide referral to [facility] and resources.  Patient states their goals for this hospitalization and ongoing recovery are:: return home with home health services CMS Medicare.gov Compare Post Acute Care list provided to:: Patient Represenative (must comment) [name]: husband) Choice offered to / list presented to : Spouse  Admission diagnosis:  Dementia Patient Active Problem List   Diagnosis Date Noted    Dementia (HCC) 01/04/2022   Rib fractures 01/04/2022   Common bile duct (CBD) obstruction    Bacteremia due to Gram-negative bacteria 04/29/2021   Hypokalemia 04/28/2021   Jaundice 04/28/2021   Elevated LFTs 04/27/2021   Bile duct stricture    Biliary obstruction    Duodenal ulcer    Acute cholecystitis 03/22/2021   PCP:  [name], MD Pharmacy:   [facility] [address], [place] - [address] [place] [address] Phone: [phone] Fax: [phone]

## 2023-04-25 ENCOUNTER — Emergency Department (HOSPITAL_COMMUNITY): Payer: Medicare Other

## 2023-04-25 ENCOUNTER — Other Ambulatory Visit: Payer: Self-pay

## 2023-04-25 ENCOUNTER — Emergency Department (HOSPITAL_COMMUNITY)
Admission: EM | Admit: 2023-04-25 | Discharge: 2023-04-25 | Disposition: A | Discharge status: Home / Self Care | Payer: Medicare Other | Attending: Emergency Medicine | Admitting: Emergency Medicine

## 2023-04-25 ENCOUNTER — Encounter (HOSPITAL_COMMUNITY): Payer: Self-pay

## 2023-04-25 DIAGNOSIS — S79912A Unspecified injury of left hip, initial encounter: Secondary | ICD-10-CM | POA: Diagnosis present

## 2023-04-25 DIAGNOSIS — S32592A Other specified fracture of left pubis, initial encounter for closed fracture: Secondary | ICD-10-CM | POA: Diagnosis not present

## 2023-04-25 DIAGNOSIS — Y92096 Garden or yard of other non-institutional residence as the place of occurrence of the external cause: Secondary | ICD-10-CM | POA: Diagnosis not present

## 2023-04-25 DIAGNOSIS — S32512A Fracture of superior rim of left pubis, initial encounter for closed fracture: Secondary | ICD-10-CM | POA: Diagnosis not present

## 2023-04-25 DIAGNOSIS — G309 Alzheimer's disease, unspecified: Secondary | ICD-10-CM | POA: Diagnosis not present

## 2023-04-25 DIAGNOSIS — F0280 Dementia in other diseases classified elsewhere without behavioral disturbance: Secondary | ICD-10-CM | POA: Diagnosis not present

## 2023-04-25 DIAGNOSIS — W19XXXA Unspecified fall, initial encounter: Secondary | ICD-10-CM

## 2023-04-25 DIAGNOSIS — Z9181 History of falling: Secondary | ICD-10-CM | POA: Diagnosis not present

## 2023-04-25 DIAGNOSIS — R262 Difficulty in walking, not elsewhere classified: Secondary | ICD-10-CM | POA: Insufficient documentation

## 2023-04-25 DIAGNOSIS — R531 Weakness: Secondary | ICD-10-CM | POA: Diagnosis not present

## 2023-04-25 DIAGNOSIS — W010XXA Fall on same level from slipping, tripping and stumbling without subsequent striking against object, initial encounter: Secondary | ICD-10-CM | POA: Insufficient documentation

## 2023-04-25 DIAGNOSIS — Z8659 Personal history of other mental and behavioral disorders: Secondary | ICD-10-CM

## 2023-04-25 DIAGNOSIS — E876 Hypokalemia: Secondary | ICD-10-CM | POA: Diagnosis not present

## 2023-04-25 LAB — BASIC METABOLIC PANEL
Anion gap: 8 (ref 5–15)
BUN: 10 mg/dL (ref 8–23)
CO2: 24 mmol/L (ref 22–32)
Calcium: 8.5 mg/dL — ABNORMAL LOW (ref 8.9–10.3)
Chloride: 107 mmol/L (ref 98–111)
Creatinine, Ser: 0.54 mg/dL (ref 0.44–1.00)
GFR, Estimated: >60 mL/min (ref >60)
Glucose, Bld: 128 mg/dL — ABNORMAL HIGH (ref 70–99)
Potassium: 2.7 mmol/L — CL (ref 3.5–5.1)
Sodium: 139 mmol/L (ref 135–145)

## 2023-04-25 LAB — CBC
HCT: 39.3 % (ref 36.0–46.0)
Hemoglobin: 13.2 g/dL (ref 12.0–15.0)
MCH: 31.2 pg (ref 26.0–34.0)
MCHC: 33.6 g/dL (ref 30.0–36.0)
MCV: 92.9 fL (ref 80.0–100.0)
Platelets: 281 10*3/uL (ref 150–400)
RBC: 4.23 MIL/uL (ref 3.87–5.11)
RDW: 14.1 % (ref 11.5–15.5)
WBC: 5.4 10*3/uL (ref 4.0–10.5)
nRBC: 0 % (ref 0.0–0.2)

## 2023-04-25 MED ORDER — OXYCODONE HCL 5 MG PO TABS: 2.5000 mg | ORAL_TABLET | ORAL | 0 refills | Status: AC | PRN

## 2023-04-25 MED ORDER — LIDOCAINE 5 % EX PTCH
1.0000 | MEDICATED_PATCH | CUTANEOUS | Status: DC
  Administered 2023-04-25: 1 via TRANSDERMAL
  Filled 2023-04-25: qty 1

## 2023-04-25 MED ORDER — POTASSIUM CHLORIDE 20 MEQ PO PACK
80.0000 meq | PACK | ORAL | Status: AC
  Administered 2023-04-25: 80 meq via ORAL
  Filled 2023-04-25: qty 4

## 2023-04-25 MED ORDER — POTASSIUM CHLORIDE CRYS ER 20 MEQ PO TBCR: 20.0000 meq | EXTENDED_RELEASE_TABLET | Freq: Two times a day (BID) | ORAL | 0 refills | Status: AC

## 2023-04-25 MED ORDER — ACETAMINOPHEN 325 MG PO TABS
650.0000 mg | ORAL_TABLET | Freq: Once | ORAL | Status: AC
  Administered 2023-04-25: 650 mg via ORAL
  Filled 2023-04-25: qty 2

## 2023-04-25 MED ORDER — OXYCODONE HCL 5 MG PO TABS
5.0000 mg | ORAL_TABLET | ORAL | Status: AC
  Administered 2023-04-25: 5 mg via ORAL
  Filled 2023-04-25: qty 1

## 2023-04-25 MED ORDER — POTASSIUM CHLORIDE 10 MEQ/100ML IV SOLN
10.0000 meq | INTRAVENOUS | Status: AC
  Administered 2023-04-25 (×2): 10 meq via INTRAVENOUS
  Filled 2023-04-25 (×2): qty 100

## 2023-04-25 NOTE — Evaluation (Signed)
Physical Therapy Evaluation Patient Details Name: Alicia Spencer MRN: [id number] DOB: 14-Oct-1941 Today's Date: 04/25/2023  History of Present Illness  Pt admitted from home s/p fall - pt had been walking sans RW which she typically uses.  Pt with hx of dementia.  Clinical Impression  Pt admitted as above and presents with functional mobility limitations 2* L LE pain, ambulatory balance deficits and limited safety awareness related to dementia.  This date, pt to/from bed unassisted but with supervision for safety and ambulated 150' in hall with RW but with cues for posture, position from RW, safety awareness and to increase BOS.  If pt has appropriate 24/7 assist at home, dc home with follow up HHPT is recommended.  If assist level is not available, alternatives with appropriate 24/7 and PT follow up is recommended as pt is at high risk for additional falls.      Assistance Recommended at Discharge Frequent or constant Supervision/Assistance  If plan is discharge home, recommend the following:  Can travel by private vehicle  A little help with walking and/or transfers;A little help with bathing/dressing/bathroom;Assistance with cooking/housework;Assist for transportation;Help with stairs or ramp for entrance        Equipment Recommendations Rolling walker (2 wheels) (Unclear if pt's RW is in good condition (pt states its broken).  Pt may need a youth level RW for return home.)  Recommendations for Other Services       Functional Status Assessment Patient has had a recent decline in their functional status and demonstrates the ability to make significant improvements in function in a reasonable and predictable amount of time.     Precautions / Restrictions Precautions Precautions: Fall Restrictions Weight Bearing Restrictions: No      Mobility  Bed Mobility Overal bed mobility: Needs Assistance Bed Mobility: Supine to Sit, Sit to Supine     Supine to sit: Supervision Sit to  supine: Supervision   General bed mobility comments: Supervision for safety only.  Pt self assisting L LE with UEs to exit/enter bed    Transfers Overall transfer level: Needs assistance   Transfers: Sit to/from Stand Sit to Stand: Supervision           General transfer comment: supervision and cues for safety only    Ambulation/Gait Ambulation/Gait assistance: Min guard Gait Distance (Feet): 150 Feet Assistive device: Rolling walker (2 wheels) Gait Pattern/deviations: Step-to pattern, Step-through pattern, Decreased step length - right, Decreased step length - left, Shuffle, Narrow base of support       General Gait Details: Cues to slow pace for safety, for posture and position from RW and to increase BOS. Pt attempting to walk without RW when close to bed with noted increased instability  Stairs            Wheelchair Mobility     Tilt Bed    Modified Rankin (Stroke Patients Only)       Balance Overall balance assessment: Needs assistance Sitting-balance support: No upper extremity supported, Feet supported Sitting balance-Leahy Scale: Good     Standing balance support: No upper extremity supported Standing balance-Leahy Scale: Fair                               Pertinent Vitals/Pain Pain Assessment Pain Assessment: Faces Faces Pain Scale: Hurts little more Pain Location: L hip/pelvic area Pain Descriptors / Indicators: Aching, Discomfort, Sore Pain Intervention(s): Limited activity within patient's tolerance, Monitored during session  Home Living Family/patient expects to be discharged to:: Private residence Living Arrangements: Spouse/significant other Available Help at Discharge: Family Type of Home: House Home Access: Stairs to enter Entrance Stairs-Rails: Right;Left;Can reach both Entrance Stairs-Number of Steps: 2 Alternate Level Stairs-Number of Steps: flight Home Layout: Able to live on main level with  bedroom/bathroom;Two level Home Equipment: Agricultural consultant (2 wheels);Gilmer Mor - single point Additional Comments: Information on residence from previous admit in 2022 as pt is "not sure".  Pt reports living with spouse and mentions dtr but unclear if they live together.  Pt later states she lives with her parents and siblings    Prior Function Prior Level of Function : Patient poor historian/Family not available             Mobility Comments: Per chart pt uses RW       Hand Dominance        Extremity/Trunk Assessment   Upper Extremity Assessment Upper Extremity Assessment: Overall WFL for tasks assessed    Lower Extremity Assessment Lower Extremity Assessment: LLE deficits/detail LLE Deficits / Details: Pt intermittently self assisting L LE with UEs:  AAROM WFL    Cervical / Trunk Assessment Cervical / Trunk Assessment: Kyphotic  Communication   Communication: No difficulties  Cognition Arousal/Alertness: Awake/alert Behavior During Therapy: Impulsive Overall Cognitive Status: History of cognitive impairments - at baseline                                 General Comments: Pleasant and follows cues but requires repetition for safety        General Comments      Exercises     Assessment/Plan    PT Assessment Patient needs continued PT services  PT Problem List Decreased strength;Decreased activity tolerance;Decreased balance;Decreased mobility;Decreased cognition;Decreased knowledge of use of DME;Pain       PT Treatment Interventions DME instruction;Gait training;Functional mobility training;Stair training;Therapeutic activities;Therapeutic exercise;Cognitive remediation;Patient/family education    PT Goals (Current goals can be found in the Care Plan section)  Acute Rehab PT Goals Patient Stated Goal: HOME PT Goal Formulation: Patient unable to participate in goal setting Time For Goal Achievement: 05/09/23 Potential to Achieve Goals: Fair     Frequency Min 1X/week     Co-evaluation               AM-PAC PT "6 Clicks" Mobility  Outcome Measure Help needed turning from your back to your side while in a flat bed without using bedrails?: None Help needed moving from lying on your back to sitting on the side of a flat bed without using bedrails?: None Help needed moving to and from a bed to a chair (including a wheelchair)?: A Little Help needed standing up from a chair using your arms (e.g., wheelchair or bedside chair)?: A Little Help needed to walk in hospital room?: A Little Help needed climbing 3-5 steps with a railing? : A Lot 6 Click Score: 19    End of Session Equipment Utilized During Treatment: Gait belt Activity Tolerance: Patient tolerated treatment well Patient left: in bed;with call bell/phone within reach;with bed alarm set Nurse Communication: Mobility status PT Visit Diagnosis: Difficulty in walking, not elsewhere classified (R26.2);History of falling (Z91.81);Pain Pain - Right/Left: Left Pain - part of body: Hip    Time: 1610-9604 PT Time Calculation (min) (ACUTE ONLY): 23 min   Charges:   PT Evaluation $PT Eval Low Complexity: 1 Low PT Treatments $Gait  Training: 8-22 mins PT General Charges $$ ACUTE PT VISIT: 1 Visit         Mauro Kaufmann PT Acute Rehabilitation Services Pager [phone] Office [phone]   Norman Piacentini 04/25/2023, 3:01 PM

## 2023-04-25 NOTE — ED Notes (Signed)
Pt had attempted to get out of bed and removed IV. IV was replaced and potassium was restarted. Bed alarm is turned on. PT with pt at this time.

## 2023-04-25 NOTE — ED Notes (Signed)
Patient left prior to final set of vitals.

## 2023-04-25 NOTE — ED Triage Notes (Addendum)
Patient said she fell in the yard this morning after getting tripped up on rocks. No LOC. Denies blood thinners. Complaining of left hip pain. Has dementia. Was by her self when she fell. Usually ambulates with walker but did not use it today in the yard when she fell.

## 2023-04-25 NOTE — ED Notes (Addendum)
PT informed RN that patient has steady gait with walker and she can be d/c home only if she has 24/hr care. RN called patient daughter who informed nurse that she has a walker and that patient lives with her daughter and husband.

## 2023-04-25 NOTE — Discharge Instructions (Addendum)
You were seen for your pelvic fracture in the emergency department. Your potassium was also found to be low.   At home, please take tylenol for your pain and oxycodone for your breakthrough pain.  Take the potassium we have prescribed you because your potassium was low.  Use your rolling walker at home.  Check your MyChart online for the results of any tests that had not resulted by the time you left the emergency department.   Follow-up with your primary doctor in 2-3 days regarding your visit.  Follow-up with orthopedic doctors in 1 to 2 weeks.  Return immediately to the emergency department if you experience any of the following: Additional falls, or any other concerning symptoms.    Thank you for visiting our Emergency Department. It was a pleasure taking care of you today.

## 2023-04-25 NOTE — ED Provider Notes (Signed)
[place] EMERGENCY DEPARTMENT AT [facility] Provider Note   CSN: [id number] Arrival date & time: 04/25/23  0915     History  Chief Complaint  Patient presents with   Alicia Spencer    Alicia Spencer is a 82 y.o. female.  82 year old female with a history of Alzheimer's dementia who presents to the emergency department after a fall.  Patient reports that she fell in the yard this morning after tripping and falling on something and has been having some left-sided hip pain.  Says that she typically uses a walker when she ambulates.  When discussed with her daughter and husband who are in the room they said that the fall was actually on Tuesday.  She was found in some pine needles and they are not exactly sure how she got there.  She was able to get up and walk to a neighbor's house.  Said that yesterday they noticed that both of her legs appeared weak and she was having some difficulty walking with her walker.  She is not on blood thinners.  No other injuries.  No slurred speech or weakness of her arms or legs otherwise.       Home Medications Prior to Admission medications   Medication Sig Start Date End Date Taking? Authorizing Provider  oxyCODONE (ROXICODONE) 5 MG immediate release tablet Take 0.5 tablets (2.5 mg total) by mouth every 4 (four) hours as needed for severe pain. 04/25/23  Yes Rondel Baton, MD  potassium chloride SA (KLOR-CON M) 20 MEQ tablet Take 1 tablet (20 mEq total) by mouth 2 (two) times daily. 04/25/23  Yes Rondel Baton, MD  acetaminophen (TYLENOL) 325 MG tablet Take 650 mg by mouth every 6 (six) hours as needed for moderate pain.    [provider]  cholecalciferol (VITAMIN D3) 25 MCG (1000 UNIT) tablet Take 1,000 Units by mouth daily.    [provider]  vitamin B-12 (CYANOCOBALAMIN) 1000 MCG tablet Take 1,000 mcg by mouth daily.    [provider]      Allergies    Sulfites and Iodine    Review of Systems   Review of  Systems  Physical Exam Updated Vital Signs BP 121/74   Pulse 69   Temp 97.6 F (36.4 C) (Oral)   Resp 15   Ht 5' (1.524 m)   Wt 44.5 kg   LMP  (LMP Unknown)   SpO2 91%   BMI 19.14 kg/m  Physical Exam Constitutional:      General: She is not in acute distress.    Appearance: Normal appearance. She is not ill-appearing.  HENT:     Head: Normocephalic and atraumatic.     Right Ear: External ear normal.     Left Ear: External ear normal.     Mouth/Throat:     Mouth: Mucous membranes are moist.     Pharynx: Oropharynx is clear.  Eyes:     Extraocular Movements: Extraocular movements intact.     Conjunctiva/sclera: Conjunctivae normal.     Pupils: Pupils are equal, round, and reactive to light.     Comments: Pupils 4 mm bilaterally  Neck:     Comments: No C-spine midline tenderness to palpation Cardiovascular:     Rate and Rhythm: Normal rate and regular rhythm.     Pulses: Normal pulses.     Heart sounds: Normal heart sounds.  Pulmonary:     Effort: Pulmonary effort is normal. No respiratory distress.     Breath  sounds: Normal breath sounds.  Abdominal:     General: Abdomen is flat.     Palpations: Abdomen is soft.     Tenderness: There is no abdominal tenderness. There is no guarding.  Musculoskeletal:        General: No deformity. Normal range of motion.     Cervical back: No rigidity or tenderness.     Comments: No tenderness to palpation of midline thoracic or lumbar spine.  No step-offs palpated.  No tenderness to palpation of chest wall.  No bruising noted.  No tenderness to palpation of bilateral clavicles.  No tenderness to palpation or deformities noted of bilateral shoulders, elbows, wrists, or ankles.  Bruising and tenderness to palpation of left hip and left knee.  Neurological:     General: No focal deficit present.     Mental Status: She is alert. Mental status is at baseline.     Cranial Nerves: No cranial nerve deficit.     Sensory: No sensory  deficit.     Motor: Weakness (Left lower extremity limited by pain) present.     Comments: Alert and oriented to the fact that she is in the hospital and her first name.  Did not know her husband or daughter were.  Did not know the year.  At baseline per family.     ED Results / Procedures / Treatments   Labs (all labs ordered are listed, but only abnormal results are displayed) Labs Reviewed  BASIC METABOLIC PANEL - Abnormal; Notable for the following components:      Result Value   Potassium 2.7 (*)    Glucose, Bld 128 (*)    Calcium 8.5 (*)    All other components within normal limits  CBC    EKG None  Radiology CT Hip Left Wo Contrast  Result Date: 04/25/2023 CLINICAL DATA:  Status post fall on Tuesday.  Left leg weakness. EXAM: CT OF THE LEFT HIP WITHOUT CONTRAST TECHNIQUE: Multidetector CT imaging of the left hip was performed according to the standard protocol. Multiplanar CT image reconstructions were also generated. RADIATION DOSE REDUCTION: This exam was performed according to the departmental dose-optimization program which includes automated exposure control, adjustment of the mA and/or kV according to patient size and/or use of iterative reconstruction technique. COMPARISON:  CT abdomen 01/03/2022 FINDINGS: Bones/Joint/Cartilage Generalized osteopenia. 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 Acute-subacute nondisplaced left superior and inferior pubic rami fractures. 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 Mild osteoarthritis of the left hip. Normal alignment. No joint effusion. Ligaments Ligaments are suboptimally evaluated by CT. Muscles and Tendons Muscles are normal. No muscle atrophy. No intramuscular fluid collection or hematoma. Soft tissue No fluid collection or hematoma. No soft tissue mass. Peripheral vascular atherosclerotic disease. IMPRESSION: 1. Acute-subacute nondisplaced left superior and inferior pubic rami fractures. 2. Old  ununited fracture of the left pubic body. Electronically Signed   By: Elige Ko M.D.   On: 04/25/2023 11:19   CT Head Wo Contrast  Result Date: 04/25/2023 CLINICAL DATA:  Unwitnessed fall, LEFT leg weakness. EXAM: CT HEAD WITHOUT CONTRAST CT CERVICAL SPINE WITHOUT CONTRAST TECHNIQUE: Multidetector CT imaging of the head and cervical spine was performed following the standard protocol without intravenous contrast. Multiplanar CT image reconstructions of the cervical spine were also generated. RADIATION DOSE REDUCTION: This exam was performed according to the departmental dose-optimization program which includes automated exposure control, adjustment of the mA and/or kV according to patient size and/or use  of iterative reconstruction technique. COMPARISON:  Head CT and cervical spine CT dated 03/22/2021 FINDINGS: CT HEAD FINDINGS Brain: Generalized age related parenchymal volume loss with commensurate dilatation of the ventricles and sulci. Mild chronic small vessel ischemic changes within the bilateral periventricular and subcortical white matter regions bilaterally. No mass, hemorrhage, edema or other evidence of acute parenchymal abnormality. No extra-axial hemorrhage. Vascular: Chronic calcified atherosclerotic changes of the large vessels at the skull base. No unexpected hyperdense vessel. Skull: Normal. Negative for fracture or focal lesion. Sinuses/Orbits: No acute finding. Other: None. CT CERVICAL SPINE FINDINGS Alignment: Stable.  No evidence of acute vertebral body subluxation. Skull base and vertebrae: No fracture line or displaced fracture fragment is seen. Facets appear intact and normally aligned throughout. Soft tissues and spinal canal: No prevertebral fluid or swelling. No visible canal hematoma. Disc levels: Mild degenerative spondylosis within the mid cervical spine. Upper chest: Negative. Other: None IMPRESSION: 1. No acute intracranial abnormality. No intracranial mass, hemorrhage or edema.  No skull fracture. 2. Chronic small vessel ischemic changes in the white matter. 3. No fracture or acute subluxation within the cervical spine. Mild degenerative spondylosis within the mid cervical spine. Electronically Signed   By: Bary Richard M.D.   On: 04/25/2023 10:52   CT Cervical Spine Wo Contrast  Result Date: 04/25/2023 CLINICAL DATA:  Unwitnessed fall, LEFT leg weakness. EXAM: CT HEAD WITHOUT CONTRAST CT CERVICAL SPINE WITHOUT CONTRAST TECHNIQUE: Multidetector CT imaging of the head and cervical spine was performed following the standard protocol without intravenous contrast. Multiplanar CT image reconstructions of the cervical spine were also generated. RADIATION DOSE REDUCTION: This exam was performed according to the departmental dose-optimization program which includes automated exposure control, adjustment of the mA and/or kV according to patient size and/or use of iterative reconstruction technique. COMPARISON:  Head CT and cervical spine CT dated 03/22/2021 FINDINGS: CT HEAD FINDINGS Brain: Generalized age related parenchymal volume loss with commensurate dilatation of the ventricles and sulci. Mild chronic small vessel ischemic changes within the bilateral periventricular and subcortical white matter regions bilaterally. No mass, hemorrhage, edema or other evidence of acute parenchymal abnormality. No extra-axial hemorrhage. Vascular: Chronic calcified atherosclerotic changes of the large vessels at the skull base. No unexpected hyperdense vessel. Skull: Normal. Negative for fracture or focal lesion. Sinuses/Orbits: No acute finding. Other: None. CT CERVICAL SPINE FINDINGS Alignment: Stable.  No evidence of acute vertebral body subluxation. Skull base and vertebrae: No fracture line or displaced fracture fragment is seen. Facets appear intact and normally aligned throughout. Soft tissues and spinal canal: No prevertebral fluid or swelling. No visible canal hematoma. Disc levels: Mild  degenerative spondylosis within the mid cervical spine. Upper chest: Negative. Other: None IMPRESSION: 1. No acute intracranial abnormality. No intracranial mass, hemorrhage or edema. No skull fracture. 2. Chronic small vessel ischemic changes in the white matter. 3. No fracture or acute subluxation within the cervical spine. Mild degenerative spondylosis within the mid cervical spine. Electronically Signed   By: Bary Richard M.D.   On: 04/25/2023 10:52   DG Knee Complete 4 Views Left  Result Date: 04/25/2023 CLINICAL DATA:  Left knee pain. EXAM: LEFT KNEE - COMPLETE 4+ VIEW COMPARISON:  None Available. FINDINGS: No acute fracture or dislocation. No aggressive osseous lesion. Normal alignment. Generalized osteopenia. Soft tissue are unremarkable. No radiopaque foreign body or soft tissue emphysema. IMPRESSION: 1. No acute osseous injury of the left knee. Electronically Signed   By: Elige Ko M.D.   On: 04/25/2023 10:12  DG Hip Unilat W or Wo Pelvis 2-3 Views Left  Result Date: 04/25/2023 CLINICAL DATA:  Left hip pain status post fall. EXAM: DG HIP (WITH OR WITHOUT PELVIS) 2-3V LEFT COMPARISON:  None Available. FINDINGS: Generalized osteopenia. Subtle lucency through the left greater trochanter which may reflect a nondisplaced fracture versus artifact. Old healed fractures of the left superior and inferior pubic rami. Proximal right femoral sideplate and femoral neck screw in satisfactory position. Otherwise no acute fracture or dislocation. No aggressive osseous lesion. Normal alignment. Calcified uterine fibroid. No radiopaque foreign body or soft tissue emphysema. IMPRESSION: 1. Subtle lucency through the left greater trochanter which may reflect a nondisplaced fracture versus artifact. If there is persistent clinical concern for an acute fracture, a CT or MRI of the left hip is recommended for increased sensitivity. Electronically Signed   By: Elige Ko M.D.   On: 04/25/2023 10:11     Procedures Procedures    Medications Ordered in ED Medications  lidocaine (LIDODERM) 5 % 1 patch (1 patch Transdermal Patch Applied 04/25/23 1004)  potassium chloride 10 mEq in 100 mL IVPB (10 mEq Intravenous New Bag/Given 04/25/23 1501)  acetaminophen (TYLENOL) tablet 650 mg (650 mg Oral Given 04/25/23 1003)  oxyCODONE (Oxy IR/ROXICODONE) immediate release tablet 5 mg (5 mg Oral Given 04/25/23 1137)  potassium chloride (KLOR-CON) packet 80 mEq (80 mEq Oral Given 04/25/23 1323)    ED Course/ Medical Decision Making/ A&P Clinical Course as of 04/25/23 1547  Thu Apr 25, 2023  1131 CT Hip Left Wo Contrast IMPRESSION: 1. Acute-subacute nondisplaced left superior and inferior pubic rami fractures. 2. 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   [RP]  1153 Discussed with Dr. Aundria Rud from orthopedic surgery.  Patient to be weightbearing as tolerated with orthopedic follow-up in 2 to 3 weeks and physical therapy. [RP]    Clinical Course User Index [RP] Rondel Baton, MD                             Medical Decision Making Amount and/or Complexity of Data Reviewed Labs: ordered. Radiology: ordered. Decision-making details documented in ED Course.  Risk OTC drugs. Prescription drug management.   Alicia Spencer is a 82 y.o. female with comorbidities that complicate the patient evaluation including Alzheimer's dementia who presents emergency department with left hip pain after a fall that was unwitnessed  Initial Ddx:  Hip fracture, TBI, C-spine injury, knee fracture  MDM/Course:  Patient had unwitnessed fall and reportedly was ambulatory afterwards but has started to experiencing some left lower extremity weakness and difficulty with ambulation since.  Also is reporting pain in this area.  On exam does have bruising to the hip as well as her left knee.  Since the fall was unwitnessed did obtain a CT of the head and C-spine which did not reveal any abnormalities.  Did have x-rays  which showed possible fracture and so a CT of her left hip showed inferior and superior left pubic rami fractures.  This was discussed with orthopedics who felt that the patient can be weightbearing as tolerated.  Was having some initial difficulty walking with a walker and so PT was consulted who came down and is recommended home health PT for the patient.  Social work was also consulted and the order was placed.  Does have a walker at home.  Was given a prescription of oxycodone 2.5 mg to take for any breakthrough pain that she  should have.  Upon re-evaluation was much improved.  Did have some screening labs that were sent in case she needed admission which showed a potassium of 2.7.  This was replenished both IV and orally and she was given a prescription of oral potassium to take at home and instructed to follow-up with her primary doctor regarding this.  This patient presents to the ED for concern of complaints listed in HPI, this involves an extensive number of treatment options, and is a complaint that carries with it a high risk of complications and morbidity. Disposition including potential need for admission considered.   Dispo: DC Home. Return precautions discussed including, but not limited to, those listed in the AVS. Allowed pt time to ask questions which were answered fully prior to dc.  Additional history obtained from family Records reviewed Outpatient Clinic Notes The following labs were independently interpreted: Chemistry and show  hypokalemia I independently reviewed the following imaging with scope of interpretation limited to determining acute life threatening conditions related to emergency care: CT Head and agree with the radiologist interpretation with the following exceptions: none I personally reviewed and interpreted cardiac monitoring: normal sinus rhythm  I personally reviewed and interpreted the pt's EKG: see above for interpretation  I have reviewed the patients home  medications and made adjustments as needed Consults: Orthopedics Social Determinants of health:  Elderly         Final Clinical Impression(s) / ED Diagnoses Final diagnoses:  Closed fracture of multiple pubic rami, left, initial encounter (HCC)  Fall, initial encounter  Hypokalemia  History of dementia    Rx / DC Orders ED Discharge Orders          Ordered    oxyCODONE (ROXICODONE) 5 MG immediate release tablet  Every 4 hours PRN        04/25/23 1510    potassium chloride SA (KLOR-CON M) 20 MEQ tablet  2 times daily        04/25/23 1510    Home Health        04/25/23 1518    Face-to-face encounter (required for Medicare/Medicaid patients)       Comments: I Rondel Baton certify that this patient is under my care and that I, or a nurse practitioner or physician's assistant working with me, had a face-to-face encounter that meets the physician face-to-face encounter requirements with this patient on 04/25/2023. The encounter with the patient was in whole, or in part for the following medical condition(s) which is the primary reason for home health care (List medical condition): Pelvic fracture and difficulty with ambulation   04/25/23 1518           CRITICAL CARE Performed by: Rondel Baton   Total critical care time: 30 minutes for hypokalemia  Critical care time was exclusive of separately billable procedures and treating other patients.  Critical care was necessary to treat or prevent imminent or life-threatening deterioration.  Critical care was time spent personally by me on the following activities: development of treatment plan with patient and/or surrogate as well as nursing, discussions with consultants, evaluation of patient's response to treatment, examination of patient, obtaining history from patient or surrogate, ordering and performing treatments and interventions, ordering and review of laboratory studies, ordering and review of radiographic  studies, pulse oximetry and re-evaluation of patient's condition.    Rondel Baton, MD 04/25/23 [phone]

## 2023-05-13 ENCOUNTER — Other Ambulatory Visit: Payer: Self-pay | Admitting: *Deleted

## 2023-05-13 ENCOUNTER — Telehealth: Payer: Self-pay | Admitting: *Deleted

## 2023-05-13 NOTE — Telephone Encounter (Signed)
Called patient to inform of Hepatic Function Panel. Patient picked phone and then hung up, Called patient again and no answer, unable to leave a message.

## 2023-05-13 NOTE — Telephone Encounter (Signed)
-----   Message from Nurse [place] P sent at 05/13/2023  3:29 PM EDT ----- Regarding: FW: LFT's  ----- Message ----- From: Chrystie Nose, RN Sent: 05/12/2023  12:00 AM EDT To: Chrystie Nose, RN Subject: LFT's                                          Pt needs LFT's, order in epic.

## 2023-05-14 NOTE — Telephone Encounter (Signed)
2nd attempt, Called patient no answer, unable to leave message.

## 2023-05-15 ENCOUNTER — Other Ambulatory Visit: Payer: Self-pay | Admitting: *Deleted

## 2023-05-15 ENCOUNTER — Other Ambulatory Visit (INDEPENDENT_AMBULATORY_CARE_PROVIDER_SITE_OTHER): Payer: Medicare Other

## 2023-05-15 DIAGNOSIS — R7989 Other specified abnormal findings of blood chemistry: Secondary | ICD-10-CM

## 2023-05-15 LAB — HEPATIC FUNCTION PANEL
ALT: 34 U/L (ref 0–35)
AST: 34 U/L (ref 0–37)
Albumin: 3.4 g/dL — ABNORMAL LOW (ref 3.5–5.2)
Alkaline Phosphatase: 323 U/L — ABNORMAL HIGH (ref 39–117)
Bilirubin, Direct: 0.3 mg/dL (ref 0.0–0.3)
Total Bilirubin: 1 mg/dL (ref 0.2–1.2)
Total Protein: 6.5 g/dL (ref 6.0–8.3)

## 2023-05-15 NOTE — Telephone Encounter (Signed)
Called patient today to inform of Hepatic Function Panel due and need to have blood draws this week. Have been trying to contact the patient since June. Patient handed the phone to the daughter and informed the daughter as well. States she will bring the patient to have the lab draws on Thursday of this week.

## 2023-06-12 ENCOUNTER — Telehealth: Payer: Self-pay

## 2023-06-12 NOTE — Telephone Encounter (Signed)
I am sorry to hear that she passed.  Very sweet lady. Thanks for letting me know. Dr.  Marina Goodell

## 2023-06-12 NOTE — Telephone Encounter (Signed)
Called to remind pts husband that it was time for her to come in for labs. He stated that she passed away in her sleep 07/01/23. Let him know I would notify Dr. Marina Goodell.

## 2023-06-12 NOTE — Telephone Encounter (Signed)
-----   Message from Nurse Kerrie Buffalo sent at 06/05/2023  1:55 PM EDT -----  ----- Message ----- From: Richardson Chiquito, RN Sent: 06/04/2023  12:00 AM EDT To: Chrystie Nose, RN  Pt needs repeat lfts around 06/15/23. Orders already in epic.Marland Kitchen just remind patient husband. Perry pt. See 05/15/23 lab result note for additional details.

## 2023-06-12 NOTE — Telephone Encounter (Signed)
Alicia Spencer can you mark this in the chart please.

## 2023-06-23 DEATH — deceased
